# Patient Record
Sex: Male | Born: 1935 | Race: White | Hispanic: No | Smoking: Former smoker
Health system: Southern US, Community
[De-identification: ages and names within clinical notes are randomized; demographics above are authoritative.]

## PROBLEM LIST (undated history)

## (undated) DIAGNOSIS — Z950 Presence of cardiac pacemaker: Secondary | ICD-10-CM

## (undated) DIAGNOSIS — Z8619 Personal history of other infectious and parasitic diseases: Secondary | ICD-10-CM

## (undated) DIAGNOSIS — I4891 Unspecified atrial fibrillation: Secondary | ICD-10-CM

## (undated) DIAGNOSIS — M1990 Unspecified osteoarthritis, unspecified site: Secondary | ICD-10-CM

## (undated) DIAGNOSIS — N529 Male erectile dysfunction, unspecified: Secondary | ICD-10-CM

## (undated) DIAGNOSIS — C439 Malignant melanoma of skin, unspecified: Secondary | ICD-10-CM

## (undated) DIAGNOSIS — H409 Unspecified glaucoma: Secondary | ICD-10-CM

## (undated) DIAGNOSIS — I499 Cardiac arrhythmia, unspecified: Secondary | ICD-10-CM

## (undated) DIAGNOSIS — I10 Essential (primary) hypertension: Secondary | ICD-10-CM

## (undated) HISTORY — DX: Unspecified osteoarthritis, unspecified site: M19.90

## (undated) HISTORY — DX: Essential (primary) hypertension: I10

## (undated) HISTORY — PX: HERNIA REPAIR: SHX51

## (undated) HISTORY — PX: NASAL SINUS SURGERY: SHX719

## (undated) HISTORY — DX: Male erectile dysfunction, unspecified: N52.9

## (undated) HISTORY — DX: Unspecified atrial fibrillation: I48.91

## (undated) HISTORY — DX: Personal history of other infectious and parasitic diseases: Z86.19

## (undated) HISTORY — DX: Malignant melanoma of skin, unspecified: C43.9

## (undated) HISTORY — PX: EYE SURGERY: SHX253

## (undated) HISTORY — DX: Unspecified glaucoma: H40.9

## (undated) HISTORY — DX: Cardiac arrhythmia, unspecified: I49.9

---

## 1970-06-06 DIAGNOSIS — C801 Malignant (primary) neoplasm, unspecified: Secondary | ICD-10-CM

## 1970-06-06 HISTORY — DX: Malignant (primary) neoplasm, unspecified: C80.1

## 2005-06-06 HISTORY — PX: NASAL SINUS SURGERY: SHX719

## 2005-07-07 ENCOUNTER — Ambulatory Visit: Payer: Self-pay | Admitting: Otolaryngology

## 2005-07-07 ENCOUNTER — Other Ambulatory Visit: Payer: Self-pay

## 2005-07-12 ENCOUNTER — Ambulatory Visit: Payer: Self-pay | Admitting: Otolaryngology

## 2013-12-12 ENCOUNTER — Other Ambulatory Visit: Payer: Self-pay | Admitting: Otolaryngology

## 2013-12-12 DIAGNOSIS — H7290 Unspecified perforation of tympanic membrane, unspecified ear: Secondary | ICD-10-CM

## 2013-12-12 DIAGNOSIS — H6690 Otitis media, unspecified, unspecified ear: Secondary | ICD-10-CM

## 2013-12-16 ENCOUNTER — Other Ambulatory Visit: Payer: Self-pay | Admitting: Otolaryngology

## 2013-12-16 LAB — CREATININE, SERUM: Creat: 0.9 mg/dL (ref 0.50–1.35)

## 2013-12-16 LAB — BUN: BUN: 12 mg/dL (ref 6–23)

## 2013-12-17 ENCOUNTER — Other Ambulatory Visit: Payer: Self-pay | Admitting: Otolaryngology

## 2013-12-17 ENCOUNTER — Ambulatory Visit
Admission: RE | Admit: 2013-12-17 | Discharge: 2013-12-17 | Disposition: A | Discharge status: Home / Self Care | Payer: Medicare Other | Source: Ambulatory Visit | Attending: Otolaryngology | Admitting: Otolaryngology

## 2013-12-17 DIAGNOSIS — J0111 Acute recurrent frontal sinusitis: Secondary | ICD-10-CM

## 2013-12-17 DIAGNOSIS — J310 Chronic rhinitis: Secondary | ICD-10-CM

## 2013-12-17 MED ORDER — IOHEXOL 300 MG/ML  SOLN
75.0000 mL | Freq: Once | INTRAMUSCULAR | Status: AC | PRN
  Administered 2013-12-17: 75 mL via INTRAVENOUS

## 2013-12-18 ENCOUNTER — Inpatient Hospital Stay: Admission: RE | Admit: 2013-12-18 | Payer: Self-pay | Source: Ambulatory Visit

## 2014-06-06 HISTORY — PX: PACEMAKER INSERTION: SHX728

## 2015-03-05 ENCOUNTER — Encounter: Payer: Self-pay | Admitting: *Deleted

## 2015-03-05 ENCOUNTER — Ambulatory Visit (INDEPENDENT_AMBULATORY_CARE_PROVIDER_SITE_OTHER): Payer: Medicare Other | Admitting: Cardiology

## 2015-03-05 VITALS — BP 128/72 | HR 52 | Ht 69.0 in | Wt 174.0 lb

## 2015-03-05 DIAGNOSIS — I4430 Unspecified atrioventricular block: Secondary | ICD-10-CM | POA: Diagnosis not present

## 2015-03-05 DIAGNOSIS — Z01812 Encounter for preprocedural laboratory examination: Secondary | ICD-10-CM

## 2015-03-05 LAB — CBC WITH DIFFERENTIAL/PLATELET
BASOS ABS: 0 10*3/uL (ref 0.0–0.1)
BASOS PCT: 0.5 % (ref 0.0–3.0)
EOS PCT: 2.2 % (ref 0.0–5.0)
Eosinophils Absolute: 0.1 10*3/uL (ref 0.0–0.7)
HEMATOCRIT: 42.9 % (ref 39.0–52.0)
Hemoglobin: 14.1 g/dL (ref 13.0–17.0)
LYMPHS ABS: 1.8 10*3/uL (ref 0.7–4.0)
LYMPHS PCT: 26.2 % (ref 12.0–46.0)
MCHC: 32.7 g/dL (ref 30.0–36.0)
MCV: 96.3 fl (ref 78.0–100.0)
MONOS PCT: 9.2 % (ref 3.0–12.0)
Monocytes Absolute: 0.6 10*3/uL (ref 0.1–1.0)
NEUTROS ABS: 4.2 10*3/uL (ref 1.4–7.7)
NEUTROS PCT: 61.9 % (ref 43.0–77.0)
PLATELETS: 172 10*3/uL (ref 150.0–400.0)
RBC: 4.45 Mil/uL (ref 4.22–5.81)
RDW: 14.9 % (ref 11.5–15.5)
WBC: 6.7 10*3/uL (ref 4.0–10.5)

## 2015-03-05 LAB — BASIC METABOLIC PANEL
BUN: 19 mg/dL (ref 6–23)
CHLORIDE: 106 meq/L (ref 96–112)
CO2: 32 meq/L (ref 19–32)
Calcium: 9.1 mg/dL (ref 8.4–10.5)
Creatinine, Ser: 1.2 mg/dL (ref 0.40–1.50)
GFR: 61.31 mL/min (ref >60.00)
GLUCOSE: 100 mg/dL — AB (ref 70–99)
POTASSIUM: 4.6 meq/L (ref 3.5–5.1)
SODIUM: 143 meq/L (ref 135–145)

## 2015-03-05 NOTE — Progress Notes (Signed)
Electrophysiology Office Note   Date:  03/05/2015   ID:  [name], DOB 1929-04-28, MRN [id number]  PCP:  Irven Shelling, MD  Primary Electrophysiologist: Constance Haw, MD    Chief Complaint  Patient presents with  . Bradycardia  . Fatigue     History of Present Illness: [name] is a 81 y.o. male who presents today for electrophysiology evaluation.   He presents today with fatigue.  He states that he gets tired when walking to the mailbox.  This has gotten worse over the last month.  He has associated SOB with exertion but no CP.     Today, he denies symptoms of palpitations, chest pain, orthopnea, PND, lower extremity edema, claudication, dizziness, presyncope, syncope, bleeding, or neurologic sequela. The patient is tolerating medications without difficulties and is otherwise without complaint today.    Past Medical History  Diagnosis Date  . Hypertension   . ED (erectile dysfunction)   . Melanoma     left abdomen   Past Surgical History  Procedure Laterality Date  . Nasal sinus surgery       Current Outpatient Prescriptions  Medication Sig Dispense Refill  . amLODipine (NORVASC) 10 MG tablet Take 10 mg by mouth daily.  3  . aspirin 81 MG tablet Take 81 mg by mouth daily.    . cholecalciferol (VITAMIN D) 1000 UNITS tablet Take 1,000 Units by mouth daily.    Marland Kitchen lisinopril (PRINIVIL,ZESTRIL) 20 MG tablet Take 20 mg by mouth daily.  3   No current facility-administered medications for this visit.    Allergies:   Sulfa antibiotics   Social History:  The patient  reports that he has quit smoking. He does not have any smokeless tobacco history on file. He reports that he drinks alcohol. He reports that he does not use illicit drugs.   Family History:  The patient's family history includes Heart failure in his mother; Hypertension in his father and mother.    ROS:  Please see the history of present illness.   Otherwise, review of systems is  positive for shortness of breath and fatigue with exertion.   All other systems are reviewed and negative.    PHYSICAL EXAM: VS:  BP 128/72 mmHg  Pulse 52  Ht 5\' 9"  (1.753 m)  Wt 174 lb (78.926 kg)  BMI 25.68 kg/m2 , BMI Body mass index is 25.68 kg/(m^2). GEN: Well nourished, well developed, in no acute distress HEENT: normal Neck: no JVD, carotid bruits, or masses Cardiac: RRR; no murmurs, rubs, or gallops,no edema  Respiratory:  clear to auscultation bilaterally, normal work of breathing GI: soft, nontender, nondistended, + BS MS: no deformity or atrophy Skin: warm and dry Neuro:  Strength and sensation are intact Psych: euthymic mood, full affect  EKG:  EKG is not ordered today. The ekg ordered yesterday shows 2:1 AV block  Recent Labs: No results found for requested labs within last 365 days.    Lipid Panel  No results found for: CHOL, TRIG, HDL, CHOLHDL, VLDL, LDLCALC, LDLDIRECT   Wt Readings from Last 3 Encounters:  03/05/15 174 lb (78.926 kg)    ASSESSMENT AND PLAN:  1. 2:1 AV block: likely the cause of his fatigue.  Discussed options including pacemaker placement.  He is agreeable to this and we will plan for early next week.  Told him that if he becomes dizzy to call and be seen.  Discussed the procedure risks and benefits including damage to lungs, heart, bleeding and  infection.    Current medicines are reviewed at length with the patient today.   The patient does not have concerns regarding his medicines.  The following changes were made today:  none  Labs/ tests ordered today include: BMP, CBC, dual chamber pacemaker placement  Orders Placed This Encounter  Procedures  . Basic Metabolic Panel (BMET)  . CBC w/Diff    Disposition:   FU with Will Camnitz post pacemaker placement  Signed, Will Meredith Leeds, MD  03/05/2015 2:54 PM     [address] [place] [address] [place] [id number] [phone] (office) [phone]  (fax)

## 2015-03-05 NOTE — Patient Instructions (Addendum)
Medication Instructions:  Your physician recommends that you continue on your current medications as directed. Please refer to the Current Medication list given to you today.  Labwork: Pre procedure labs today: BMET & CBCD  Testing/Procedures: Your physician has recommended that you have a pacemaker inserted. A pacemaker is a small device that is placed under the skin of your chest or abdomen to help control abnormal heart rhythms. This device uses electrical pulses to prompt the heart to beat at a normal rate. Pacemakers are used to treat heart rhythms that are too slow. Wire (leads) are attached to the pacemaker that goes into the chambers of you heart. This is done in the hospital and usually requires and overnight stay. Please see the instruction sheet given to you today for more information.  Follow-Up: Your wound check is scheduled for 03/23/15 at 12:00 pm at [address]    Any Other Special Instructions Will Be Listed Below (If Applicable). Thank you for choosing [facility]!!   Trinidad Curet, RN [phone]    Pacemaker Implantation The heart has its own electrical system, or natural pacemaker, to regulate the heartbeat. Sometimes, the natural pacemaker system of the heart fails and causes the heart to beat too slowly. If this happens, a pacemaker can be surgically placed to help the heart beat at a normal or programmed rate. A pacemaker is a small, battery-powered device that is placed under the skin and is programmed to sense your heartbeats. If your heart rate is lower than the programmed rate, the pacemaker will pace your heart. Parts of a pacemaker include:  Wires or leads. The leads are placed in the heart and transmit electricity to the heart. The leads are connected to the pulse generator.  Pulse generator. The pulse generator contains a computer and a memory system. The pulse generator also produces the electrical signal that triggers the heart to  beat. A pacemaker may be placed if:  You have a slow heartbeat (bradycardia).  You have fainting (syncope).  Shortness of breath (dyspnea) due to heart problems. LET [facility] CARE PROVIDER KNOW ABOUT:  Any allergies you may have.  All medicines you are taking, including vitamins, herbs, eye drops, creams, and over-the-counter medicines.  Previous problems you or members of your family have had with the use of anesthetics.  Any blood disorders you have.  Previous surgeries you have had.  Medical conditions you have.  Possibility of pregnancy, if this applies. RISKS AND COMPLICATIONS Generally, pacemaker implantation is a safe procedure. However, problems can occur and include:  Bleeding.  Unable to place the pacemaker under local sedation.  Infection. BEFORE THE PROCEDURE  You will have blood work drawn before the procedure.  Do not use any tobacco products including cigarettes, chewing tobacco, or electronic cigarettes. If you need help quitting, ask your health care provider.  Do not eat or drink anything after midnight on the night before the procedure or as directed by your health care provider.  Ask your health care provider about:  Changing or stopping your regular medicines. This is especially important if you are taking diabetes medicines or blood thinners.  Taking medicines such as aspirin and ibuprofen. These medicines can thin your blood. Do not take these medicines before your procedure if your health care provider asks you not to.  Ask your health care provider if you can take a sip of water with any approved medicines the morning of the procedure. PROCEDURE  The surgery to place a  pacemaker is considered a minimally invasive surgical procedure. It is done under a local anesthetic, which is an injection at the incision site that makes the skin numb. You are also given sedation and pain medicine that makes you drowsy during the procedure.   An  intravenous line (IV) will be started in your hand or arm so sedation and pain medicine can be given during the pacemaker procedure.  A numbing medicine will be injected into the skin where the pacemaker is to be placed. A small incision will then be made into the skin. The pacemaker is usually placed under the skin near the collarbone.  After the incision has been made, the leads will be inserted into a large vein and guided into the heart using X-ray.  Using the same incision that was used to place the leads, a small pocket will be created under the skin to hold the pulse generator. The leads will then be connected to the pulse generator.  The incision site will then be closed. A bandage (dressing) is placed over the pacemaker site. The dressing is removed 24-48 hours afterward. AFTER THE PROCEDURE  You will be taken to a recovery area after the pacemaker implant. Your vital signs such as blood pressure, heart rate, breathing, and oxygen levels will be monitored.  A chest X-ray will be done after the pacemaker has been implanted. This is to make sure the pacemaker and leads are in the correct place. Document Released: 05/13/2002 Document Revised: 10/07/2013 Document Reviewed: 09/27/2011 [facility] Patient Information 2015 [place], [place]. This information is not intended to replace advice given to you by your health care provider. Make sure you discuss any questions you have with your health care provider.  Pacemaker Implantation, Care After Refer to this sheet over the next few weeks. These instructions provide you with information on caring for yourself after the procedure. Your health care provider may also give you more specific instructions. Your treatment has been planned according to current medical practices, but problems sometimes occur. Call your health care provider if you have any problems or questions regarding your pacemaker.  WHAT TO EXPECT AFTER THE PROCEDURE  You may feel pain.  Some pain is normal. It may last a few days.  A slight bump may be seen over the skin where the device was placed. Sometimes, it is possible to feel the device under the skin. This is normal.  In the months and years afterward, your health care provider will check the device, the leads, and the battery every few months. Eventually, when the battery is low, the device will be replaced. HOME CARE INSTRUCTIONS Medicines  Take medicines only as directed by your health care provider.  If you were prescribed an antibiotic medicine, finish it all even if you start to feel better.  Do not take any other medicines without asking your health care provider first. Some medicines, including certain painkillers, can cause bleeding in your stomach after surgery. Wound Care  Do not remove the bandage on your chest until directed to do so by your health care provider.  After your bandage is removed, you may see pieces of tape called skin adhesive strips over the area where the cut was made (incision site). Let them fall off on their own.  Check the incision site every day to make sure it is not infected, bleeding, or starting to pull apart.  Do not use lotions or ointments near the incision site unless directed to do so.  Keep the  incision area clean and dry for 2-3 days after the procedure or as directed by your health care provider. It takes several weeks for the incision site to completely heal.  Do not take baths, swim, or use a hot tub until your health care provider approves. Activities  Try to walk a little every day. Exercising is important after this procedure. It is also important to use your shoulder on the side of the pacemaker in daily tasks that do not require exaggerated motion.  Avoid sudden jerking, pulling, or chopping movements that pull your upper arm far away from your body for at least 6 weeks.  Do not lift your upper arm above your shoulders for at least 6 weeks. This means no  tennis, golf, or swimming for this period of time. If you sleep with the arm above your head, use a restraint to prevent this from happening as you sleep.  You may go back to work when your health care provider says it is okay. Check with your health care provider before you start to drive or play sports. Other Instructions  Follow diet instructions if they were provided. You should be able to eat what you usually do right away, but you may need to limit your salt intake.  Weigh yourself every day. If you suddenly gain weight, fluid may be building up in your body.  Always carry your pacemaker identification card with you. The card should list the implant date, device model, and manufacturer. Consider wearing a medical alert bracelet or necklace.  Tell all health care providers that you have a pacemaker. This may prevent them from giving you a magnetic resource imaging scan (MRI) because of the strong magnets used during that test.  If you must pass through a metal detector, quickly walk through it. Do not stop under the detector or stand near it.  Avoid places or objects with a strong electric or magnetic field, including:  Engineer, maintenance. When at the airport, let officials know you have a pacemaker. Your ID card will let you be checked in a way that is safe for you and that will not damage your pacemaker. Also, do not let a security person wave a magnetic wand near your pacemaker. That can make it stop working.  Power plants.  Large electrical generators.  Radiofrequency transmission towers, such as cell phone and radio towers.  Do not use amateur (ham) radio equipment or electric (arc) welding torches. Some devices are safe to use if held at least 1 foot from your pacemaker. These include power tools, lawn mowers, and speakers. If you are unsure of whether something is safe to use, ask your health care provider.  You may safely use electric blankets, heating pads, computers, and  microwave ovens.  When using your cell phone, hold it to the ear opposite the pacemaker. Do not leave your cell phone in a pocket over the pacemaker.  Keep all follow-up visits as directed by your health care provider. This is how your health care provider makes sure your chest is healing the way it should. Ask your health care provider when you should come back to have your stitches or staples taken out.  Have your pacemaker checked every 3-6 months or as directed by your health care provider. Most pacemakers last for 4-8 years before a new one is needed. SEEK MEDICAL CARE IF:  You gain weight suddenly.  Your legs or feet swell more than they have before.  It feels like your  heart is fluttering or skipping beats (heart palpitations).  You have a fever. SEEK IMMEDIATE MEDICAL CARE IF:  You have chest pain.  You feel more short of breath than you have felt before.  You feel more light-headed than you have felt before.  You have problems with your incision site, such as swelling or bleeding, or it starts to open up.  You have drainage, redness, swelling, or pain at your incision site. Document Released: 12/10/2004 Document Revised: 10/07/2013 Document Reviewed: 09/23/2011 [facility] Patient Information 2015 [place], [place]. This information is not intended to replace advice given to you by your health care provider. Make sure you discuss any questions you have with your health care provider.

## 2015-03-09 ENCOUNTER — Encounter (HOSPITAL_COMMUNITY): Payer: Self-pay

## 2015-03-09 ENCOUNTER — Ambulatory Visit (HOSPITAL_COMMUNITY)
Admission: RE | Admit: 2015-03-09 | Discharge: 2015-03-10 | Disposition: A | Discharge status: Home / Self Care | Payer: Medicare Other | Source: Ambulatory Visit | Attending: Cardiology | Admitting: Cardiology

## 2015-03-09 ENCOUNTER — Encounter (HOSPITAL_COMMUNITY): Payer: Self-pay | Admitting: Anesthesiology

## 2015-03-09 ENCOUNTER — Encounter (HOSPITAL_COMMUNITY)
Admission: RE | Disposition: A | Discharge status: Home / Self Care | Payer: Medicare Other | Source: Ambulatory Visit | Attending: Cardiology

## 2015-03-09 DIAGNOSIS — Z95818 Presence of other cardiac implants and grafts: Secondary | ICD-10-CM

## 2015-03-09 DIAGNOSIS — I441 Atrioventricular block, second degree: Secondary | ICD-10-CM | POA: Insufficient documentation

## 2015-03-09 DIAGNOSIS — I459 Conduction disorder, unspecified: Secondary | ICD-10-CM | POA: Diagnosis present

## 2015-03-09 DIAGNOSIS — I10 Essential (primary) hypertension: Secondary | ICD-10-CM | POA: Insufficient documentation

## 2015-03-09 DIAGNOSIS — I4430 Unspecified atrioventricular block: Secondary | ICD-10-CM

## 2015-03-09 DIAGNOSIS — Z01812 Encounter for preprocedural laboratory examination: Secondary | ICD-10-CM

## 2015-03-09 HISTORY — PX: EP IMPLANTABLE DEVICE: SHX172B

## 2015-03-09 LAB — SURGICAL PCR SCREEN
MRSA, PCR: POSITIVE — AB
Staphylococcus aureus: POSITIVE — AB

## 2015-03-09 SURGERY — PACEMAKER IMPLANT
Anesthesia: General

## 2015-03-09 MED ORDER — CEFAZOLIN SODIUM 1-5 GM-% IV SOLN
1.0000 g | Freq: Four times a day (QID) | INTRAVENOUS | Status: AC
  Administered 2015-03-09 – 2015-03-10 (×3): 1 g via INTRAVENOUS
  Filled 2015-03-09 (×3): qty 50

## 2015-03-09 MED ORDER — INFLUENZA VAC SPLIT QUAD 0.5 ML IM SUSY
0.5000 mL | PREFILLED_SYRINGE | INTRAMUSCULAR | Status: DC
  Filled 2015-03-09: qty 0.5

## 2015-03-09 MED ORDER — MUPIROCIN 2 % EX OINT
1.0000 "application " | TOPICAL_OINTMENT | Freq: Once | CUTANEOUS | Status: AC
  Administered 2015-03-09: 1 via TOPICAL
  Filled 2015-03-09: qty 22

## 2015-03-09 MED ORDER — SODIUM CHLORIDE 0.9 % IR SOLN
Status: AC
  Filled 2015-03-09: qty 2

## 2015-03-09 MED ORDER — ONDANSETRON HCL 4 MG/2ML IJ SOLN: 4.0000 mg | Freq: Four times a day (QID) | INTRAMUSCULAR | Status: DC | PRN

## 2015-03-09 MED ORDER — SODIUM CHLORIDE 0.9 % IV SOLN
Freq: Once | INTRAVENOUS | Status: AC
  Administered 2015-03-09: 10:00:00 via INTRAVENOUS

## 2015-03-09 MED ORDER — LIDOCAINE-EPINEPHRINE 1 %-1:100000 IJ SOLN
INTRAMUSCULAR | Status: AC
  Filled 2015-03-09: qty 2

## 2015-03-09 MED ORDER — FENTANYL CITRATE (PF) 100 MCG/2ML IJ SOLN
INTRAMUSCULAR | Status: AC
  Filled 2015-03-09: qty 4

## 2015-03-09 MED ORDER — FENTANYL CITRATE (PF) 100 MCG/2ML IJ SOLN
INTRAMUSCULAR | Status: DC | PRN
  Administered 2015-03-09: 25 ug via INTRAVENOUS
  Administered 2015-03-09: 12.5 ug via INTRAVENOUS

## 2015-03-09 MED ORDER — SODIUM CHLORIDE 0.9 % IR SOLN
80.0000 mg | Status: DC
  Filled 2015-03-09: qty 2

## 2015-03-09 MED ORDER — ACETAMINOPHEN 325 MG PO TABS
325.0000 mg | ORAL_TABLET | ORAL | Status: DC | PRN
  Administered 2015-03-10: 650 mg via ORAL
  Filled 2015-03-09: qty 2

## 2015-03-09 MED ORDER — SODIUM CHLORIDE 0.9 % IV SOLN
INTRAVENOUS | Status: DC
  Administered 2015-03-09: 10:00:00 via INTRAVENOUS

## 2015-03-09 MED ORDER — CEFAZOLIN SODIUM-DEXTROSE 2-3 GM-% IV SOLR
2.0000 g | INTRAVENOUS | Status: AC
  Administered 2015-03-09: 2 g via INTRAVENOUS

## 2015-03-09 MED ORDER — MIDAZOLAM HCL 5 MG/5ML IJ SOLN
INTRAMUSCULAR | Status: DC | PRN
  Administered 2015-03-09: 1 mg via INTRAVENOUS
  Administered 2015-03-09: 2 mg via INTRAVENOUS

## 2015-03-09 MED ORDER — MIDAZOLAM HCL 5 MG/5ML IJ SOLN
INTRAMUSCULAR | Status: AC
  Filled 2015-03-09: qty 25

## 2015-03-09 MED ORDER — CEFAZOLIN SODIUM-DEXTROSE 2-3 GM-% IV SOLR
INTRAVENOUS | Status: AC
  Filled 2015-03-09: qty 50

## 2015-03-09 MED ORDER — CHLORHEXIDINE GLUCONATE CLOTH 2 % EX PADS
6.0000 | MEDICATED_PAD | Freq: Every day | CUTANEOUS | Status: DC
  Administered 2015-03-10: 6 via TOPICAL

## 2015-03-09 MED ORDER — MUPIROCIN 2 % EX OINT
TOPICAL_OINTMENT | CUTANEOUS | Status: AC
  Administered 2015-03-09: 1 via TOPICAL
  Filled 2015-03-09: qty 22

## 2015-03-09 MED ORDER — LIDOCAINE-EPINEPHRINE 1 %-1:100000 IJ SOLN
INTRAMUSCULAR | Status: DC | PRN
  Administered 2015-03-09: 40 mL via INTRADERMAL

## 2015-03-09 MED ORDER — MUPIROCIN 2 % EX OINT
1.0000 "application " | TOPICAL_OINTMENT | Freq: Two times a day (BID) | CUTANEOUS | Status: DC
  Administered 2015-03-09: 1 via NASAL

## 2015-03-09 SURGICAL SUPPLY — 7 items
CABLE SURGICAL S-101-97-12 (CABLE) ×2 IMPLANT
LEAD TENDRIL SDX 2088TC-52CM (Lead) ×2 IMPLANT
LEAD TENDRIL SDX 2088TC-58CM (Lead) ×2 IMPLANT
PAD DEFIB LIFELINK (PAD) ×2 IMPLANT
PPM ASSURITY DR PM2240 (Pacemaker) ×2 IMPLANT
SHEATH CLASSIC 7F (SHEATH) ×4 IMPLANT
TRAY PACEMAKER INSERTION (CUSTOM PROCEDURE TRAY) ×2 IMPLANT

## 2015-03-09 NOTE — Progress Notes (Signed)
Orthopedic Tech Progress Note Patient Details:  [name] 1928-08-08 [id number]  Ortho Devices Type of Ortho Device: Arm sling Ortho Device/Splint Interventions: Application   [name] 03/09/2015, 1:40 PM

## 2015-03-09 NOTE — H&P (Signed)
Arda Keadle Meredith Leeds, MD Physician Signed Electrophysiology Consult Note 03/09/2015 11:06 AM    Expand All Collapse All   History of Present Illness: [name] is a 81 y.o. male who presents today for electrophysiology evaluation. He presents today with fatigue. He states that he gets tired when walking to the mailbox. This has gotten worse over the last month. He has associated SOB with exertion but no CP.    Today, he denies symptoms of palpitations, chest pain, orthopnea, PND, lower extremity edema, claudication, dizziness, presyncope, syncope, bleeding, or neurologic sequela. The patient is tolerating medications without difficulties and is otherwise without complaint today.    Past Medical History  Diagnosis Date  . Hypertension   . ED (erectile dysfunction)   . Melanoma     left abdomen   Past Surgical History  Procedure Laterality Date  . Nasal sinus surgery       Current Outpatient Prescriptions  Medication Sig Dispense Refill  . amLODipine (NORVASC) 10 MG tablet Take 10 mg by mouth daily.  3  . aspirin 81 MG tablet Take 81 mg by mouth daily.    . cholecalciferol (VITAMIN D) 1000 UNITS tablet Take 1,000 Units by mouth daily.    Marland Kitchen lisinopril (PRINIVIL,ZESTRIL) 20 MG tablet Take 20 mg by mouth daily.  3   No current facility-administered medications for this visit.    Allergies: Sulfa antibiotics   Social History: The patient reports that he has quit smoking. He does not have any smokeless tobacco history on file. He reports that he drinks alcohol. He reports that he does not use illicit drugs.   Family History: The patient's family history includes Heart failure in his mother; Hypertension in his father and mother.    ROS: Please see the history of present illness. Otherwise, review of systems is positive for shortness of breath and fatigue with  exertion. All other systems are reviewed and negative.    PHYSICAL EXAM: VS: BP 128/72 mmHg  Pulse 52  Ht 5\' 9"  (1.753 m)  Wt 174 lb (78.926 kg)  BMI 25.68 kg/m2 , BMI Body mass index is 25.68 kg/(m^2). GEN: Well nourished, well developed, in no acute distress  HEENT: normal  Neck: no JVD, carotid bruits, or masses Cardiac: RRR; no murmurs, rubs, or gallops,no edema  Respiratory: clear to auscultation bilaterally, normal work of breathing GI: soft, nontender, nondistended, + BS MS: no deformity or atrophy  Skin: warm and dry Neuro: Strength and sensation are intact Psych: euthymic mood, full affect  EKG: EKG is not ordered today. The ekg ordered yesterday shows 2:1 AV block  Recent Labs: No results found for requested labs within last 365 days.    Lipid Panel   Labs (Brief)    No results found for: CHOL, TRIG, HDL, CHOLHDL, VLDL, LDLCALC, LDLDIRECT     Wt Readings from Last 3 Encounters:  03/05/15 174 lb (78.926 kg)    ASSESSMENT AND PLAN:  1. 2:1 AV block: likely the cause of his fatigue. Discussed options including pacemaker placement. He is agreeable to this and we Jamai Dolce plan for early next week. Told him that if he becomes dizzy to call and be seen. Discussed the procedure risks and benefits including damage to lungs, heart, bleeding and infection.   Current medicines are reviewed at length with the patient today.  The patient does not have concerns regarding his medicines. The following changes were made today: none  Labs/ tests ordered today include: BMP, CBC, dual chamber pacemaker placement  Orders Placed This Encounter  Procedures  . Basic Metabolic Panel (BMET)  . CBC w/Diff    Disposition: FU with Antonius Hartlage post pacemaker placement  Signed, Daisa Stennis Meredith Leeds, MD  03/05/2015 2:54 PM        Patient presented with 2:1 heart block. Presents today feeling well but with increased fatigue. Plan  for dual chamber pacemaker placement. Risks and benefits discussed with the patient including damage to heart or lungs, bleeding and infection. Patient agrees and Tong Pieczynski proceed with dual chamber pacemaker placement.  Destin Kittler 03/09/2015 11:07 AM       Routing History     Date/Time From To Method   03/09/2015 11:07 AM Rocket Gunderson Meredith Leeds, MD Lavone Orn, MD Fax

## 2015-03-09 NOTE — Consult Note (Deleted)
History of Present Illness: [name] is a 81 y.o. male who presents today for electrophysiology evaluation. He presents today with fatigue. He states that he gets tired when walking to the mailbox. This has gotten worse over the last month. He has associated SOB with exertion but no CP.    Today, he denies symptoms of palpitations, chest pain, orthopnea, PND, lower extremity edema, claudication, dizziness, presyncope, syncope, bleeding, or neurologic sequela. The patient is tolerating medications without difficulties and is otherwise without complaint today.    Past Medical History  Diagnosis Date  . Hypertension   . ED (erectile dysfunction)   . Melanoma     left abdomen   Past Surgical History  Procedure Laterality Date  . Nasal sinus surgery       Current Outpatient Prescriptions  Medication Sig Dispense Refill  . amLODipine (NORVASC) 10 MG tablet Take 10 mg by mouth daily.  3  . aspirin 81 MG tablet Take 81 mg by mouth daily.    . cholecalciferol (VITAMIN D) 1000 UNITS tablet Take 1,000 Units by mouth daily.    Marland Kitchen lisinopril (PRINIVIL,ZESTRIL) 20 MG tablet Take 20 mg by mouth daily.  3   No current facility-administered medications for this visit.    Allergies: Sulfa antibiotics   Social History: The patient  reports that he has quit smoking. He does not have any smokeless tobacco history on file. He reports that he drinks alcohol. He reports that he does not use illicit drugs.   Family History: The patient's family history includes Heart failure in his mother; Hypertension in his father and mother.    ROS: Please see the history of present illness. Otherwise, review of systems is positive for shortness of breath and fatigue with exertion. All other systems are reviewed and negative.    PHYSICAL EXAM: VS: BP 128/72 mmHg  Pulse 52  Ht 5\' 9"  (1.753 m)  Wt 174 lb (78.926 kg)  BMI 25.68 kg/m2 , BMI  Body mass index is 25.68 kg/(m^2). GEN: Well nourished, well developed, in no acute distress  HEENT: normal  Neck: no JVD, carotid bruits, or masses Cardiac: RRR; no murmurs, rubs, or gallops,no edema  Respiratory: clear to auscultation bilaterally, normal work of breathing GI: soft, nontender, nondistended, + BS MS: no deformity or atrophy  Skin: warm and dry Neuro: Strength and sensation are intact Psych: euthymic mood, full affect  EKG: EKG is not ordered today. The ekg ordered yesterday shows 2:1 AV block  Recent Labs: No results found for requested labs within last 365 days.    Lipid Panel   Labs (Brief)    No results found for: CHOL, TRIG, HDL, CHOLHDL, VLDL, LDLCALC, LDLDIRECT     Wt Readings from Last 3 Encounters:  03/05/15 174 lb (78.926 kg)    ASSESSMENT AND PLAN:  1. 2:1 AV block: likely the cause of his fatigue. Discussed options including pacemaker placement. He is agreeable to this and we [name] plan for early next week. Told him that if he becomes dizzy to call and be seen. Discussed the procedure risks and benefits including damage to lungs, heart, bleeding and infection.   Current medicines are reviewed at length with the patient today.  The patient does not have concerns regarding his medicines. The following changes were made today: none  Labs/ tests ordered today include: BMP, CBC, dual chamber pacemaker placement  Orders Placed This Encounter  Procedures  . Basic Metabolic Panel (BMET)  . CBC w/Diff    Disposition:  FU with [name] post pacemaker placement  Signed, Geoffrey Bonfanti Meredith Leeds, MD  03/05/2015 2:54 PM        Patient presented with 2:1 heart block.  Presents today feeling well but with increased fatigue.  Plan for dual chamber pacemaker placement.  Risks and benefits discussed with the patient including damage to heart or lungs, bleeding and infection.  Patient agrees and [name] proceed with dual chamber  pacemaker placement.  [name] 03/09/2015 11:07 AM

## 2015-03-10 ENCOUNTER — Ambulatory Visit (HOSPITAL_COMMUNITY): Payer: Medicare Other

## 2015-03-10 DIAGNOSIS — I459 Conduction disorder, unspecified: Secondary | ICD-10-CM

## 2015-03-10 DIAGNOSIS — I10 Essential (primary) hypertension: Secondary | ICD-10-CM | POA: Diagnosis not present

## 2015-03-10 DIAGNOSIS — I441 Atrioventricular block, second degree: Secondary | ICD-10-CM | POA: Diagnosis not present

## 2015-03-10 MED FILL — Sodium Chloride Irrigation Soln 0.9%: Qty: 500 | Status: AC

## 2015-03-10 MED FILL — Gentamicin Sulfate Inj 40 MG/ML: INTRAMUSCULAR | Qty: 2 | Status: AC

## 2015-03-10 NOTE — Discharge Instructions (Signed)
° ° °  Supplemental Discharge Instructions for  Pacemaker/Defibrillator Patients  Activity No heavy lifting or vigorous activity with your left/right arm for 6 to 8 weeks.  Do not raise your left/right arm above your head for one week.  Gradually raise your affected arm as drawn below.                       10/07                     10/08                    10/09                    10/10            NO DRIVING for  1 week    ; you may begin driving on     68/11     . WOUND CARE - Keep the wound area clean and dry.  Do not get this area wet for one week. No showers for one week; you may shower on      10/10        . - The tape/steri-strips on your wound will fall off; do not pull them off.  No bandage is needed on the site.  DO  NOT apply any creams, oils, or ointments to the wound area. - If you notice any drainage or discharge from the wound, any swelling or bruising at the site, or you develop a fever > 101? F after you are discharged home, call the office at once.  Special Instructions - You are still able to use cellular telephones; use the ear opposite the side where you have your pacemaker/defibrillator.  Avoid carrying your cellular phone near your device. - When traveling through airports, show security personnel your identification card to avoid being screened in the metal detectors.  Ask the security personnel to use the hand wand. - Avoid arc welding equipment, MRI testing (magnetic resonance imaging), TENS units (transcutaneous nerve stimulators).  Call the office for questions about other devices. - Avoid electrical appliances that are in poor condition or are not properly grounded. - Microwave ovens are safe to be near or to operate.  DO NOT DRIVE YOURSELF OR A FAMILY MEMBER WITH A PACEMAKER TO THE HOSPITAL--CALL 911.

## 2015-03-10 NOTE — Discharge Summary (Signed)
ELECTROPHYSIOLOGY PROCEDURE DISCHARGE SUMMARY    Patient ID: [name],  MRN: [id number], DOB/AGE: June 29, 1928 81 y.o.  Admit date: 03/09/2015 Discharge date: 03/10/2015  Primary Care Physician: Irven Shelling, MD Electrophysiologist: Dr. Curt Bears  Primary Discharge Diagnosis:  Symptomatic bradycardia status post pacemaker implantation this admission.  Secondary Discharge Diagnosis:  HTN  Allergies  Allergen Reactions  . Avelox [Moxifloxacin Hcl In Nacl] Hives  . Sulfa Antibiotics Rash     Procedures This Admission:  1.  Implantation of a St. Jude model [id number] (serial number [id number] ) dual chamber PPM on 03/09/15 by Dr Curt Bears, a Lyman 2088 (serial number [id number]) right atrial lead and Shiloh 2088 (serial number [id number])  right ventricular lead. There were no immediate post procedure complications. 2.  CXR on 03/10/15 demonstrated no pneumothorax status post device implantation.   Brief HPI: [name] is a 81 y.o. male was referred to electrophysiology in the outpatient setting for consideration of PPM implantation.  Past medical history includes HTN.  The patient has had symptomatic bradycardia noted to be in 2:1 AV block, without reversible causes identified.  Risks, benefits, and alternatives to PPM implantation were reviewed with the patient who wished to proceed.   Hospital Course:  The patient was admitted for elective implantation of a St. Jude PPM with details as outlined above.  He was monitored on telemetry overnight which demonstrated SR with V pacing.  Left chest was without hematoma with slight ecchymosis.  The device was interrogated and found to be functioning normally.  CXR was obtained and demonstrated no pneumothorax status post device implantation.  Wound care, arm mobility, and restrictions were reviewed with the patient.  The patient was examined by Dr. Curt Bears and considered stable for discharge to home.     Physical Exam: Filed Vitals:   03/09/15 1510 03/09/15 1610 03/09/15 2130 03/10/15 0500  BP: 108/58 131/62 139/63 139/68  Pulse:   72 78  Temp:  98 F (36.7 C) 98.2 F (36.8 C) 98.3 F (36.8 C)  TempSrc:  Oral Oral Oral  Resp: 13 31 23 18   Height:      Weight:    167 lb 6.4 oz (75.932 kg)  SpO2:  95% 97% 96%    GEN- The patient is well appearing, alert and oriented x 3 today.   HEENT: normocephalic, atraumatic; sclera clear, conjunctiva pink; hearing intact; oropharynx clear; neck supple Lungs- Clear to ausculation bilaterally, normal work of breathing.  No wheezes, rales, rhonchi Heart- Regular rate and rhythm, no murmurs, rubs or gallops, PMI not laterally displaced GI- soft, non-tender, non-distended, bowel sounds present Extremities- no clubbing, cyanosis, or edema; DP/PT/radial pulses 2+ bilaterally MS- no significant deformity or atrophy Skin- warm and dry, no rash or lesion, left chest without hematoma/ecchymosis Psych- euthymic mood, full affect Neuro- intact grossly   Labs:   Lab Results  Component Value Date   WBC 6.7 03/05/2015   HGB 14.1 03/05/2015   HCT 42.9 03/05/2015   MCV 96.3 03/05/2015   PLT 172.0 03/05/2015     Recent Labs Lab 03/05/15 1512  NA 143  K 4.6  CL 106  CO2 32  BUN 19  CREATININE 1.20  CALCIUM 9.1  GLUCOSE 100*    Discharge Medications:    Medication List    TAKE these medications        amLODipine 10 MG tablet  Commonly known as:  NORVASC  Take 10 mg by mouth daily.  aspirin 81 MG tablet  Take 81 mg by mouth daily.     cholecalciferol 1000 UNITS tablet  Commonly known as:  VITAMIN D  Take 1,000 Units by mouth daily.     lisinopril 20 MG tablet  Commonly known as:  PRINIVIL,ZESTRIL  Take 20 mg by mouth daily.        Disposition: Home Discharge Instructions    Diet - low sodium heart healthy    Complete by:  As directed      Increase activity slowly    Complete by:  As directed            Follow-up Information    Follow up with [facility] On 03/19/2015.   Why:  Wound and device check at 3:00 pm, Please arrive 15 minutes early for paperwork.    Contact information:   [address] [place] [address]       Follow up with Solomon Skowronek Meredith Leeds, MD.   Specialty:  Cardiology   Why:  See in 3 months, the office Topacio Cella call.   Contact information:   [address] [place] [address] [phone]       Duration of Discharge Encounter: Greater than 30 minutes including physician time.  SignedTommye Standard, PA-C 03/10/2015 9:43 AM  I have seen and examined this patient with Tommye Standard.  Agree with above, note added to reflect my findings.  On exam, regular rhythm, no murmurs, lungs clear.  Patient presented 2:1 heart block and is now s/p pacemaker.  CXR without issue.  Plan for follow up in 10-14 days in device clinic for wound check and device programming.  Laneice Meneely M. Dallyn Bergland MD 03/10/2015 2:28 PM

## 2015-03-19 ENCOUNTER — Ambulatory Visit (INDEPENDENT_AMBULATORY_CARE_PROVIDER_SITE_OTHER): Payer: Medicare Other | Admitting: *Deleted

## 2015-03-19 DIAGNOSIS — I4430 Unspecified atrioventricular block: Secondary | ICD-10-CM

## 2015-03-20 LAB — CUP PACEART INCLINIC DEVICE CHECK
Battery Remaining Longevity: 84 mo
Battery Voltage: 3.11 V
Implantable Lead Implant Date: 20161003
Implantable Lead Location: 753859
Implantable Lead Location: 753860
Lead Channel Pacing Threshold Amplitude: 0.5 V
Lead Channel Pacing Threshold Pulse Width: 0.4 ms
Lead Channel Sensing Intrinsic Amplitude: 5 mV
Lead Channel Sensing Intrinsic Amplitude: 7.2 mV
MDC IDC LEAD IMPLANT DT: 20161003
MDC IDC MSMT LEADCHNL RA IMPEDANCE VALUE: 487.5 Ohm
MDC IDC MSMT LEADCHNL RA PACING THRESHOLD PULSEWIDTH: 0.4 ms
MDC IDC MSMT LEADCHNL RV IMPEDANCE VALUE: 550 Ohm
MDC IDC MSMT LEADCHNL RV PACING THRESHOLD AMPLITUDE: 0.5 V
MDC IDC MSMT LEADCHNL RV PACING THRESHOLD AMPLITUDE: 0.5 V
MDC IDC MSMT LEADCHNL RV PACING THRESHOLD PULSEWIDTH: 0.4 ms
MDC IDC PG SERIAL: 7818292
MDC IDC SESS DTM: 20161013190436
MDC IDC SET LEADCHNL RA PACING AMPLITUDE: 1.5 V
MDC IDC SET LEADCHNL RV PACING AMPLITUDE: 3.5 V
MDC IDC SET LEADCHNL RV PACING PULSEWIDTH: 0.4 ms
MDC IDC SET LEADCHNL RV SENSING SENSITIVITY: 2 mV
MDC IDC STAT BRADY RA PERCENT PACED: 43 %
MDC IDC STAT BRADY RV PERCENT PACED: 99.91 %
Pulse Gen Model: 2240

## 2015-03-20 NOTE — Progress Notes (Signed)
Wound check appointment. Steri-strips removed. Wound without redness or edema. Incision edges approximated, wound well healed. Normal device function. Thresholds, sensing, and impedances consistent with implant measurements. Device programmed at 3.5V/auto capture programmed on for extra safety margin until 3 month visit. Histogram distribution appropriate for patient and level of activity. (2) mode switches (<1%)---max dur. 10 sec, Max A 185, Max V 74---AT. No high ventricular rates noted. Patient educated about wound care, arm mobility, lifting restrictions. ROV in 3 months with WC.

## 2015-03-27 ENCOUNTER — Encounter: Payer: Self-pay | Admitting: Cardiology

## 2015-03-30 ENCOUNTER — Encounter: Payer: Self-pay | Admitting: Cardiology

## 2015-05-12 ENCOUNTER — Encounter: Payer: Self-pay | Admitting: Cardiology

## 2015-05-13 ENCOUNTER — Encounter: Payer: Self-pay | Admitting: Cardiology

## 2015-06-22 ENCOUNTER — Encounter: Payer: Self-pay | Admitting: Cardiology

## 2015-06-22 ENCOUNTER — Ambulatory Visit (INDEPENDENT_AMBULATORY_CARE_PROVIDER_SITE_OTHER): Payer: Medicare Other | Admitting: Cardiology

## 2015-06-22 VITALS — BP 136/74 | HR 80 | Ht 69.0 in | Wt 175.6 lb

## 2015-06-22 DIAGNOSIS — I4430 Unspecified atrioventricular block: Secondary | ICD-10-CM

## 2015-06-22 DIAGNOSIS — Z45018 Encounter for adjustment and management of other part of cardiac pacemaker: Secondary | ICD-10-CM | POA: Diagnosis not present

## 2015-06-22 LAB — CUP PACEART INCLINIC DEVICE CHECK
Battery Remaining Longevity: 104.4
Battery Voltage: 2.99 V
Brady Statistic RA Percent Paced: 49 %
Implantable Lead Implant Date: 20161003
Implantable Lead Location: 753860
Lead Channel Impedance Value: 437.5 Ohm
Lead Channel Impedance Value: 475 Ohm
Lead Channel Pacing Threshold Amplitude: 0.5 V
Lead Channel Pacing Threshold Amplitude: 0.5 V
Lead Channel Pacing Threshold Amplitude: 0.5 V
Lead Channel Pacing Threshold Amplitude: 0.5 V
Lead Channel Pacing Threshold Pulse Width: 0.4 ms
Lead Channel Pacing Threshold Pulse Width: 0.4 ms
Lead Channel Sensing Intrinsic Amplitude: 5.4 mV
Lead Channel Setting Pacing Pulse Width: 0.4 ms
MDC IDC LEAD IMPLANT DT: 20161003
MDC IDC LEAD LOCATION: 753859
MDC IDC MSMT LEADCHNL RA SENSING INTR AMPL: 5 mV
MDC IDC MSMT LEADCHNL RV PACING THRESHOLD PULSEWIDTH: 0.4 ms
MDC IDC MSMT LEADCHNL RV PACING THRESHOLD PULSEWIDTH: 0.4 ms
MDC IDC SESS DTM: 20170116151146
MDC IDC SET LEADCHNL RA PACING AMPLITUDE: 1.5 V
MDC IDC SET LEADCHNL RV PACING AMPLITUDE: 2.5 V
MDC IDC SET LEADCHNL RV SENSING SENSITIVITY: 2 mV
MDC IDC STAT BRADY RV PERCENT PACED: 99.77 %
Pulse Gen Serial Number: 7818292

## 2015-06-22 NOTE — Progress Notes (Signed)
Electrophysiology Office Note   Date:  06/22/2015   ID:  [name], DOB Sep 08, 1928, MRN [id number]  PCP:  Irven Shelling, MD  Primary Electrophysiologist:  Constance Haw, MD    Chief Complaint  Patient presents with  . Pacemaker Check     History of Present Illness: [name] is a 82 y.o. male who presents today for electrophysiology evaluation.   He initially presented in September with fatugue and was found to be in 2:1 heart block.  He had a pacemaker place din October.     Today, he denies symptoms of palpitations, chest pain, shortness of breath, orthopnea, PND, lower extremity edema, claudication, dizziness, presyncope, syncope, bleeding, or neurologic sequela. The patient is tolerating medications without difficulties and is otherwise without complaint today.    Past Medical History  Diagnosis Date  . Hypertension   . ED (erectile dysfunction)   . Melanoma ([place])     left abdomen   Past Surgical History  Procedure Laterality Date  . Nasal sinus surgery    . Ep implantable device N/A 03/09/2015    Procedure: Pacemaker Implant;  Surgeon: Will Meredith Leeds, MD;  Location: [facility];  Service: Cardiovascular;  Laterality: N/A;     Current Outpatient Prescriptions  Medication Sig Dispense Refill  . amLODipine (NORVASC) 10 MG tablet Take 10 mg by mouth daily.  3  . aspirin 81 MG tablet Take 81 mg by mouth daily.    . cholecalciferol (VITAMIN D) 1000 UNITS tablet Take 1,000 Units by mouth daily.    Marland Kitchen lisinopril (PRINIVIL,ZESTRIL) 20 MG tablet Take 20 mg by mouth daily.  3   No current facility-administered medications for this visit.    Allergies:   Avelox and Sulfa antibiotics   Social History:  The patient  reports that he has quit smoking. He has never used smokeless tobacco. He reports that he drinks alcohol. He reports that he does not use illicit drugs.   Family History:  The patient's family history includes Heart failure in his  mother; Hypertension in his father and mother.    ROS:  Please see the history of present illness.   All other systems are reviewed and negative.    PHYSICAL EXAM: VS:  BP 136/74 mmHg  Pulse 80  Ht 5\' 9"  (1.753 m)  Wt 175 lb 9.6 oz (79.652 kg)  BMI 25.92 kg/m2 , BMI Body mass index is 25.92 kg/(m^2). GEN: Well nourished, well developed, in no acute distress HEENT: normal Neck: no JVD, carotid bruits, or masses Cardiac: RRR; no murmurs, rubs, or gallops,no edema  Respiratory:  clear to auscultation bilaterally, normal work of breathing GI: soft, nontender, nondistended, + BS MS: no deformity or atrophy Skin: warm and dry,  device pocket is well healed Neuro:  Strength and sensation are intact Psych: euthymic mood, full affect  EKG:  EKG is ordered today. The ekg ordered today shows sinus rhythm, V paced   Device interrogation is reviewed today in detail.  See PaceArt for details.   Recent Labs: 03/05/2015: BUN 19; Creatinine, Ser 1.20; Hemoglobin 14.1; Platelets 172.0; Potassium 4.6; Sodium 143    Lipid Panel  No results found for: CHOL, TRIG, HDL, CHOLHDL, VLDL, LDLCALC, LDLDIRECT   Wt Readings from Last 3 Encounters:  06/22/15 175 lb 9.6 oz (79.652 kg)  03/10/15 167 lb 6.4 oz (75.932 kg)  03/05/15 174 lb (78.926 kg)   ASSESSMENT AND PLAN:  1.  2:1 AV block: had dual chamber pacemaker placed on  0000000 without complication.  Feels much improved with significantly less fatigue.  Had 2 mode switches with the longest of 40 seconds, appears to be atrial fibrillation.  will continue to monitor burden to see if he requires anticoagulation.  Has CHADS2VASc of 3.  No changes necessary to the device today.  Will continue with current management.  Current medicines are reviewed at length with the patient today.   The patient does not have concerns regarding his medicines.  The following changes were made today:  none  Labs/ tests ordered today include:  No orders of the defined  types were placed in this encounter.     Disposition:   FU with Will Camnitz 9  months  Signed, Will Meredith Leeds, MD  06/22/2015 2:44 PM     [facility] [address] [place] [address] [phone] (office) [phone] (fax)

## 2015-06-22 NOTE — Patient Instructions (Signed)
Medication Instructions:  Your physician recommends that you continue on your current medications as directed. Please refer to the Current Medication list given to you today.  Labwork: None ordered  Testing/Procedures: None ordered  Follow-Up: Remote monitoring is used to monitor your Pacemaker of ICD from home. This monitoring reduces the number of office visits required to check your device to one time per year. It allows [place] to keep an eye on the functioning of your device to ensure it is working properly. You are scheduled for a device check from home on 09/21/15. You may send your transmission at any time that day. If you have a wireless device, the transmission will be sent automatically. After your physician reviews your transmission, you will receive a postcard with your next transmission date.  Your physician wants you to follow-up in: 9 months with Dr. Curt Bears.  You will receive a reminder letter in the mail two months in advance. If you don't receive a letter, please call our office to schedule the follow-up appointment.  If you need a refill on your cardiac medications before your next appointment, please call your pharmacy.  Thank you for choosing [facility]!!   Trinidad Curet, RN [phone]

## 2015-09-21 ENCOUNTER — Ambulatory Visit (INDEPENDENT_AMBULATORY_CARE_PROVIDER_SITE_OTHER): Payer: Medicare Other | Admitting: *Deleted

## 2015-09-21 ENCOUNTER — Telehealth: Payer: Self-pay | Admitting: Cardiology

## 2015-09-21 DIAGNOSIS — I4430 Unspecified atrioventricular block: Secondary | ICD-10-CM

## 2015-09-21 NOTE — Telephone Encounter (Signed)
Attempted to confirm remote transmission with pt. No answer and was unable to leave a message.   

## 2015-09-22 NOTE — Progress Notes (Signed)
Remote pacemaker transmission.   

## 2015-10-29 ENCOUNTER — Encounter: Payer: Self-pay | Admitting: Cardiology

## 2015-10-29 LAB — CUP PACEART REMOTE DEVICE CHECK
Battery Remaining Percentage: 95.5 %
Brady Statistic AP VP Percent: 66 %
Brady Statistic AP VS Percent: 1 %
Brady Statistic AS VP Percent: 34 %
Brady Statistic AS VS Percent: 1 %
Implantable Lead Implant Date: 20161003
Implantable Lead Location: 753859
Lead Channel Impedance Value: 450 Ohm
Lead Channel Pacing Threshold Amplitude: 0.625 V
Lead Channel Pacing Threshold Pulse Width: 0.4 ms
Lead Channel Setting Pacing Amplitude: 1.625
Lead Channel Setting Pacing Amplitude: 2.5 V
MDC IDC LEAD IMPLANT DT: 20161003
MDC IDC LEAD LOCATION: 753860
MDC IDC MSMT BATTERY REMAINING LONGEVITY: 107 mo
MDC IDC MSMT BATTERY VOLTAGE: 2.99 V
MDC IDC MSMT LEADCHNL RA IMPEDANCE VALUE: 460 Ohm
MDC IDC MSMT LEADCHNL RA SENSING INTR AMPL: 5 mV
MDC IDC PG SERIAL: 7818292
MDC IDC SESS DTM: 20170418110247
MDC IDC SET LEADCHNL RV PACING PULSEWIDTH: 0.4 ms
MDC IDC SET LEADCHNL RV SENSING SENSITIVITY: 2 mV
MDC IDC STAT BRADY RA PERCENT PACED: 65 %
MDC IDC STAT BRADY RV PERCENT PACED: 99 %
Pulse Gen Model: 2240

## 2015-11-10 DIAGNOSIS — H524 Presbyopia: Secondary | ICD-10-CM | POA: Diagnosis not present

## 2015-11-10 DIAGNOSIS — H40153 Residual stage of open-angle glaucoma, bilateral: Secondary | ICD-10-CM | POA: Diagnosis not present

## 2015-12-09 DIAGNOSIS — I10 Essential (primary) hypertension: Secondary | ICD-10-CM | POA: Diagnosis not present

## 2015-12-21 ENCOUNTER — Ambulatory Visit (INDEPENDENT_AMBULATORY_CARE_PROVIDER_SITE_OTHER): Payer: Medicare Other | Admitting: *Deleted

## 2015-12-21 DIAGNOSIS — I4430 Unspecified atrioventricular block: Secondary | ICD-10-CM | POA: Diagnosis not present

## 2015-12-21 NOTE — Progress Notes (Signed)
Remote pacemaker transmission.   

## 2015-12-23 ENCOUNTER — Encounter: Payer: Self-pay | Admitting: Cardiology

## 2015-12-24 LAB — CUP PACEART REMOTE DEVICE CHECK
Battery Remaining Longevity: 103 mo
Battery Remaining Percentage: 95.5 %
Brady Statistic AP VP Percent: 65 %
Brady Statistic AS VP Percent: 34 %
Brady Statistic AS VS Percent: 1 %
Brady Statistic RV Percent Paced: 99 %
Date Time Interrogation Session: 20170717074007
Implantable Lead Implant Date: 20161003
Implantable Lead Location: 753859
Lead Channel Impedance Value: 430 Ohm
Lead Channel Setting Pacing Amplitude: 1.5 V
Lead Channel Setting Pacing Amplitude: 2.5 V
Lead Channel Setting Pacing Pulse Width: 0.4 ms
Lead Channel Setting Sensing Sensitivity: 2 mV
MDC IDC LEAD IMPLANT DT: 20161003
MDC IDC LEAD LOCATION: 753860
MDC IDC MSMT BATTERY VOLTAGE: 2.99 V
MDC IDC MSMT LEADCHNL RA IMPEDANCE VALUE: 440 Ohm
MDC IDC MSMT LEADCHNL RA PACING THRESHOLD AMPLITUDE: 0.5 V
MDC IDC MSMT LEADCHNL RA PACING THRESHOLD PULSEWIDTH: 0.4 ms
MDC IDC MSMT LEADCHNL RA SENSING INTR AMPL: 5 mV
MDC IDC PG SERIAL: 7818292
MDC IDC STAT BRADY AP VS PERCENT: 1 %
MDC IDC STAT BRADY RA PERCENT PACED: 65 %
Pulse Gen Model: 2240

## 2016-02-03 DIAGNOSIS — K1329 Other disturbances of oral epithelium, including tongue: Secondary | ICD-10-CM | POA: Diagnosis not present

## 2016-02-04 DIAGNOSIS — K1329 Other disturbances of oral epithelium, including tongue: Secondary | ICD-10-CM | POA: Diagnosis not present

## 2016-03-21 ENCOUNTER — Ambulatory Visit (INDEPENDENT_AMBULATORY_CARE_PROVIDER_SITE_OTHER): Payer: Medicare Other | Admitting: *Deleted

## 2016-03-21 DIAGNOSIS — I4430 Unspecified atrioventricular block: Secondary | ICD-10-CM | POA: Diagnosis not present

## 2016-03-21 NOTE — Progress Notes (Signed)
Remote pacemaker transmission.   

## 2016-03-22 DIAGNOSIS — H40013 Open angle with borderline findings, low risk, bilateral: Secondary | ICD-10-CM | POA: Diagnosis not present

## 2016-03-23 ENCOUNTER — Encounter: Payer: Self-pay | Admitting: Cardiology

## 2016-04-01 LAB — CUP PACEART REMOTE DEVICE CHECK
Battery Remaining Longevity: 110 mo
Battery Remaining Percentage: 95.5 %
Brady Statistic AP VS Percent: 1 %
Brady Statistic AS VS Percent: 1 %
Brady Statistic RV Percent Paced: 99 %
Implantable Lead Location: 753860
Lead Channel Impedance Value: 450 Ohm
Lead Channel Pacing Threshold Amplitude: 0.5 V
Lead Channel Pacing Threshold Pulse Width: 0.4 ms
Lead Channel Sensing Intrinsic Amplitude: 5 mV
Lead Channel Setting Pacing Pulse Width: 0.4 ms
Lead Channel Setting Sensing Sensitivity: 2 mV
MDC IDC LEAD IMPLANT DT: 20161003
MDC IDC LEAD IMPLANT DT: 20161003
MDC IDC LEAD LOCATION: 753859
MDC IDC MSMT BATTERY VOLTAGE: 2.99 V
MDC IDC MSMT LEADCHNL RA PACING THRESHOLD AMPLITUDE: 0.625 V
MDC IDC MSMT LEADCHNL RV IMPEDANCE VALUE: 430 Ohm
MDC IDC MSMT LEADCHNL RV PACING THRESHOLD PULSEWIDTH: 0.4 ms
MDC IDC MSMT LEADCHNL RV SENSING INTR AMPL: 5.4 mV
MDC IDC SESS DTM: 20171016062150
MDC IDC SET LEADCHNL RA PACING AMPLITUDE: 1.625
MDC IDC SET LEADCHNL RV PACING AMPLITUDE: 2.5 V
MDC IDC STAT BRADY AP VP PERCENT: 64 %
MDC IDC STAT BRADY AS VP PERCENT: 35 %
MDC IDC STAT BRADY RA PERCENT PACED: 64 %
Pulse Gen Model: 2240
Pulse Gen Serial Number: 7818292

## 2016-04-05 ENCOUNTER — Encounter: Payer: Self-pay | Admitting: Cardiology

## 2016-04-20 ENCOUNTER — Encounter: Payer: Self-pay | Admitting: Cardiology

## 2016-04-20 ENCOUNTER — Ambulatory Visit (INDEPENDENT_AMBULATORY_CARE_PROVIDER_SITE_OTHER): Payer: Medicare Other | Admitting: Cardiology

## 2016-04-20 VITALS — BP 154/82 | HR 66 | Ht 70.0 in | Wt 174.0 lb

## 2016-04-20 DIAGNOSIS — I441 Atrioventricular block, second degree: Secondary | ICD-10-CM | POA: Diagnosis not present

## 2016-04-20 LAB — CUP PACEART INCLINIC DEVICE CHECK
Battery Remaining Longevity: 103 mo
Date Time Interrogation Session: 20171115163023
Implantable Lead Implant Date: 20161003
Implantable Lead Implant Date: 20161003
Implantable Lead Location: 753859
Implantable Pulse Generator Implant Date: 20161003
Lead Channel Impedance Value: 412.5 Ohm
Lead Channel Pacing Threshold Amplitude: 0.75 V
Lead Channel Pacing Threshold Pulse Width: 0.4 ms
Lead Channel Setting Pacing Amplitude: 2.5 V
Lead Channel Setting Sensing Sensitivity: 2 mV
MDC IDC LEAD LOCATION: 753860
MDC IDC MSMT BATTERY VOLTAGE: 2.99 V
MDC IDC MSMT LEADCHNL RA IMPEDANCE VALUE: 450 Ohm
MDC IDC MSMT LEADCHNL RA PACING THRESHOLD AMPLITUDE: 0.5 V
MDC IDC MSMT LEADCHNL RA SENSING INTR AMPL: 5 mV — AB
MDC IDC MSMT LEADCHNL RV PACING THRESHOLD PULSEWIDTH: 0.4 ms
MDC IDC MSMT LEADCHNL RV SENSING INTR AMPL: 4.5 mV
MDC IDC SET LEADCHNL RA PACING AMPLITUDE: 1.5 V
MDC IDC SET LEADCHNL RV PACING PULSEWIDTH: 0.4 ms
MDC IDC STAT BRADY RA PERCENT PACED: 64 %
MDC IDC STAT BRADY RV PERCENT PACED: 99.44 %
Pulse Gen Model: 2240
Pulse Gen Serial Number: 7818292

## 2016-04-20 MED ORDER — APIXABAN 5 MG PO TABS: 5.0000 mg | ORAL_TABLET | Freq: Two times a day (BID) | ORAL | 6 refills | Status: DC

## 2016-04-20 NOTE — Patient Instructions (Addendum)
Medication Instructions:  Your physician has recommended you make the following change in your medication:  Start Eliquis 5 mg by mouth twice daily.  Stop Aspirin    Labwork: none  Testing/Procedures: none  Follow-Up: Your physician wants you to follow-up in: 6 months.  You will receive a reminder letter in the mail two months in advance. If you don't receive a letter, please call our office to schedule the follow-up appointment.   Any Other Special Instructions Will Be Listed Below (If Applicable).     If you need a refill on your cardiac medications before your next appointment, please call your pharmacy.

## 2016-04-20 NOTE — Progress Notes (Signed)
Electrophysiology Office Note   Date:  04/22/2016   ID:  [name], DOB [birth year], MRN [id number]  PCP:  Irven Shelling, MD  Primary Electrophysiologist:  Constance Haw, MD    Chief Complaint  Patient presents with  . Pacemaker Check    AFib     History of Present Illness: [name] is a 82 y.o. male who presents today for electrophysiology evaluation.   He initially presented in September with fatugue and was found to be in 2:1 heart block.  He had a pacemaker placed in October 2016.  He has been doing well without any issues. It was noted that he had atrial fibrillation on his device interrogation. He has not had any issues with palpitations, shortness of breath, or fatigue.   Today, he denies symptoms of palpitations, chest pain, shortness of breath, orthopnea, PND, lower extremity edema, claudication, dizziness, presyncope, syncope, bleeding, or neurologic sequela. The patient is tolerating medications without difficulties and is otherwise without complaint today.    Past Medical History:  Diagnosis Date  . ED (erectile dysfunction)   . Hypertension   . Melanoma ([place])    left abdomen   Past Surgical History:  Procedure Laterality Date  . EP IMPLANTABLE DEVICE N/A 03/09/2015   Procedure: Pacemaker Implant;  Surgeon: Cyenna Rebello Meredith Leeds, MD;  Location: [facility];  Service: Cardiovascular;  Laterality: N/A;  . NASAL SINUS SURGERY       Current Outpatient Prescriptions  Medication Sig Dispense Refill  . amLODipine (NORVASC) 10 MG tablet Take 10 mg by mouth daily.  3  . cholecalciferol (VITAMIN D) 1000 UNITS tablet Take 1,000 Units by mouth daily.    Marland Kitchen lisinopril (PRINIVIL,ZESTRIL) 20 MG tablet Take 20 mg by mouth daily.  3  . apixaban (ELIQUIS) 5 MG TABS tablet Take 1 tablet (5 mg total) by mouth 2 (two) times daily. 60 tablet 6   No current facility-administered medications for this visit.     Allergies:   Avelox [moxifloxacin hcl in  nacl] and Sulfa antibiotics   Social History:  The patient  reports that he has quit smoking. He has never used smokeless tobacco. He reports that he drinks alcohol. He reports that he does not use drugs.   Family History:  The patient's family history includes Heart failure in his mother; Hypertension in his father and mother.    ROS:  Please see the history of present illness.   All other systems are reviewed and positive for sweats, constipation   PHYSICAL EXAM: VS:  BP (!) 154/82   Pulse 66   Ht 5\' 10"  (1.778 m)   Wt 174 lb (78.9 kg)   BMI 24.97 kg/m  , BMI Body mass index is 24.97 kg/m. GEN: Well nourished, well developed, in no acute distress  HEENT: normal  Neck: no JVD, carotid bruits, or masses Cardiac: RRR; no murmurs, rubs, or gallops,no edema  Respiratory:  clear to auscultation bilaterally, normal work of breathing GI: soft, nontender, nondistended, + BS MS: no deformity or atrophy  Skin: warm and dry,  device pocket is well healed Neuro:  Strength and sensation are intact Psych: euthymic mood, full affect  EKG:  EKG is ordered today. Personal review of the ekg ordered today shows sinus rhythm, V paced   Device interrogation is reviewed today in detail.  See PaceArt for details.   Recent Labs: No results found for requested labs within last 8760 hours.    Lipid Panel  No results found  for: CHOL, TRIG, HDL, CHOLHDL, VLDL, LDLCALC, LDLDIRECT   Wt Readings from Last 3 Encounters:  04/20/16 174 lb (78.9 kg)  06/22/15 175 lb 9.6 oz (79.7 kg)  03/10/15 167 lb 6.4 oz (75.9 kg)   ASSESSMENT AND PLAN:  1.  2:1 AV block: had dual chamber pacemaker placed on 0000000 without complication.  His R waves on his device interrogation are significantly lower than that were before. That being said, his ventricular rate is in the low 30s and he paces all the time. Reeshemah Nazaryan not make any further changes to programming.  2. Hypertension: Elevated today but generally  well-controlled at home. He'll continue to take his blood pressure at home and Bethan Adamek call us if it's consistently in the 140s to 150s.  3. Paroxysmal atrial fibrillation: Found on device interrogation. I discussed with him the importance of anticoagulation. We Ryley Bachtel start him on Eliquis today.  This patients CHA2DS2-VASc Score and unadjusted Ischemic Stroke Rate (% per year) is equal to 3.2 % stroke rate/year from a score of 3  Above score calculated as 1 point each if present [CHF, HTN, DM, Vascular=MI/PAD/Aortic Plaque, Age if 65-74, or Male] Above score calculated as 2 points each if present [Age > 75, or Stroke/TIA/TE]     Current medicines are reviewed at length with the patient today.   The patient does not have concerns regarding his medicines.  The following changes were made today:  Eliquis  Labs/ tests ordered today include:  Orders Placed This Encounter  Procedures  . Implantable device check  . EKG 12-Lead     Disposition:   FU with [place] Monestime 6 months  Signed, Dyamond Tolosa Meredith Leeds, MD  04/22/2016 7:09 AM     [facility] [address] [place] [address] [phone] (office) [phone] (fax)

## 2016-04-22 ENCOUNTER — Telehealth: Payer: Self-pay | Admitting: Cardiology

## 2016-04-22 NOTE — Telephone Encounter (Signed)
Calling stating that he saw Dr. Curt Bears on 11/15 and was started on Eliquis.  States he understood that the dose was 2.5 mg but when he received the Rx it was for 5 mg.  Advised that is correct dosage per his office note.  He verbalizes understanding and will take as prescribed.

## 2016-04-22 NOTE — Telephone Encounter (Signed)
New message  Pt call requesting to speak with Rn about his office visit on 11/15. Pt states he want to verify the correct dosage for medication that was prescribed to him during this appt. Please call back to discuss

## 2016-05-16 DIAGNOSIS — C004 Malignant neoplasm of lower lip, inner aspect: Secondary | ICD-10-CM | POA: Diagnosis not present

## 2016-05-23 DIAGNOSIS — C069 Malignant neoplasm of mouth, unspecified: Secondary | ICD-10-CM | POA: Diagnosis not present

## 2016-05-23 DIAGNOSIS — C001 Malignant neoplasm of external lower lip: Secondary | ICD-10-CM | POA: Diagnosis not present

## 2016-06-14 DIAGNOSIS — Z0000 Encounter for general adult medical examination without abnormal findings: Secondary | ICD-10-CM | POA: Diagnosis not present

## 2016-06-14 DIAGNOSIS — I480 Paroxysmal atrial fibrillation: Secondary | ICD-10-CM | POA: Diagnosis not present

## 2016-06-14 DIAGNOSIS — Z1389 Encounter for screening for other disorder: Secondary | ICD-10-CM | POA: Diagnosis not present

## 2016-06-14 DIAGNOSIS — I10 Essential (primary) hypertension: Secondary | ICD-10-CM | POA: Diagnosis not present

## 2016-06-14 DIAGNOSIS — C4402 Squamous cell carcinoma of skin of lip: Secondary | ICD-10-CM | POA: Diagnosis not present

## 2016-07-20 ENCOUNTER — Ambulatory Visit (INDEPENDENT_AMBULATORY_CARE_PROVIDER_SITE_OTHER): Payer: Medicare Other | Admitting: *Deleted

## 2016-07-20 DIAGNOSIS — I4430 Unspecified atrioventricular block: Secondary | ICD-10-CM

## 2016-07-20 NOTE — Progress Notes (Signed)
Remote pacemaker transmission.   

## 2016-07-21 ENCOUNTER — Encounter: Payer: Self-pay | Admitting: Cardiology

## 2016-07-27 DIAGNOSIS — C004 Malignant neoplasm of lower lip, inner aspect: Secondary | ICD-10-CM | POA: Diagnosis not present

## 2016-07-29 DIAGNOSIS — C004 Malignant neoplasm of lower lip, inner aspect: Secondary | ICD-10-CM | POA: Diagnosis not present

## 2016-07-29 LAB — CUP PACEART REMOTE DEVICE CHECK
Battery Remaining Percentage: 95.5 %
Battery Voltage: 2.99 V
Brady Statistic AP VP Percent: 66 %
Brady Statistic RA Percent Paced: 65 %
Date Time Interrogation Session: 20180214074942
Implantable Lead Implant Date: 20161003
Implantable Lead Location: 753859
Implantable Lead Location: 753860
Implantable Pulse Generator Implant Date: 20161003
Lead Channel Impedance Value: 410 Ohm
Lead Channel Pacing Threshold Amplitude: 0.5 V
Lead Channel Pacing Threshold Pulse Width: 0.4 ms
Lead Channel Setting Sensing Sensitivity: 2 mV
MDC IDC LEAD IMPLANT DT: 20161003
MDC IDC MSMT BATTERY REMAINING LONGEVITY: 109 mo
MDC IDC MSMT LEADCHNL RA IMPEDANCE VALUE: 450 Ohm
MDC IDC MSMT LEADCHNL RA SENSING INTR AMPL: 5 mV
MDC IDC MSMT LEADCHNL RV PACING THRESHOLD AMPLITUDE: 0.75 V
MDC IDC MSMT LEADCHNL RV PACING THRESHOLD PULSEWIDTH: 0.4 ms
MDC IDC MSMT LEADCHNL RV SENSING INTR AMPL: 9.9 mV
MDC IDC SET LEADCHNL RA PACING AMPLITUDE: 1.5 V
MDC IDC SET LEADCHNL RV PACING AMPLITUDE: 2.5 V
MDC IDC SET LEADCHNL RV PACING PULSEWIDTH: 0.4 ms
MDC IDC STAT BRADY AP VS PERCENT: 1 %
MDC IDC STAT BRADY AS VP PERCENT: 34 %
MDC IDC STAT BRADY AS VS PERCENT: 1 %
MDC IDC STAT BRADY RV PERCENT PACED: 99 %
Pulse Gen Model: 2240
Pulse Gen Serial Number: 7818292

## 2016-08-29 DIAGNOSIS — H903 Sensorineural hearing loss, bilateral: Secondary | ICD-10-CM | POA: Diagnosis not present

## 2016-10-24 ENCOUNTER — Ambulatory Visit (INDEPENDENT_AMBULATORY_CARE_PROVIDER_SITE_OTHER): Payer: Medicare Other | Admitting: Cardiology

## 2016-10-24 ENCOUNTER — Encounter: Payer: Self-pay | Admitting: Cardiology

## 2016-10-24 ENCOUNTER — Encounter (INDEPENDENT_AMBULATORY_CARE_PROVIDER_SITE_OTHER): Payer: Self-pay

## 2016-10-24 VITALS — BP 136/76 | HR 67 | Ht 69.0 in | Wt 174.6 lb

## 2016-10-24 DIAGNOSIS — I10 Essential (primary) hypertension: Secondary | ICD-10-CM | POA: Diagnosis not present

## 2016-10-24 DIAGNOSIS — I4430 Unspecified atrioventricular block: Secondary | ICD-10-CM

## 2016-10-24 DIAGNOSIS — Z45018 Encounter for adjustment and management of other part of cardiac pacemaker: Secondary | ICD-10-CM | POA: Diagnosis not present

## 2016-10-24 DIAGNOSIS — C439 Malignant melanoma of skin, unspecified: Secondary | ICD-10-CM | POA: Insufficient documentation

## 2016-10-24 DIAGNOSIS — I480 Paroxysmal atrial fibrillation: Secondary | ICD-10-CM | POA: Diagnosis not present

## 2016-10-24 DIAGNOSIS — Z85820 Personal history of malignant melanoma of skin: Secondary | ICD-10-CM | POA: Insufficient documentation

## 2016-10-24 LAB — CUP PACEART INCLINIC DEVICE CHECK
Battery Voltage: 2.99 V
Brady Statistic RA Percent Paced: 68 %
Brady Statistic RV Percent Paced: 99.62 %
Implantable Lead Implant Date: 20161003
Lead Channel Impedance Value: 425 Ohm
Lead Channel Pacing Threshold Amplitude: 0.5 V
Lead Channel Pacing Threshold Amplitude: 0.75 V
Lead Channel Pacing Threshold Pulse Width: 0.4 ms
Lead Channel Sensing Intrinsic Amplitude: 5 mV
Lead Channel Setting Pacing Amplitude: 2.5 V
MDC IDC LEAD IMPLANT DT: 20161003
MDC IDC LEAD LOCATION: 753859
MDC IDC LEAD LOCATION: 753860
MDC IDC MSMT LEADCHNL RV IMPEDANCE VALUE: 412.5 Ohm
MDC IDC MSMT LEADCHNL RV PACING THRESHOLD PULSEWIDTH: 0.4 ms
MDC IDC MSMT LEADCHNL RV SENSING INTR AMPL: 3 mV
MDC IDC PG IMPLANT DT: 20161003
MDC IDC SESS DTM: 20180521172154
MDC IDC SET LEADCHNL RA PACING AMPLITUDE: 1.5 V
MDC IDC SET LEADCHNL RV PACING PULSEWIDTH: 0.4 ms
MDC IDC SET LEADCHNL RV SENSING SENSITIVITY: 2 mV
Pulse Gen Serial Number: 7818292

## 2016-10-24 NOTE — Patient Instructions (Signed)
Medication Instructions:    Your physician recommends that you continue on your current medications as directed. Please refer to the Current Medication list given to you today.  --- If you need a refill on your cardiac medications before your next appointment, please call your pharmacy. ---  Labwork:  None ordered  Testing/Procedures:  None ordered  Follow-Up: Remote monitoring is used to monitor your Pacemaker of ICD from home. This monitoring reduces the number of office visits required to check your device to one time per year. It allows [place] to keep an eye on the functioning of your device to ensure it is working properly. You are scheduled for a device check from home on 01/23/2017. You may send your transmission at any time that day. If you have a wireless device, the transmission will be sent automatically. After your physician reviews your transmission, you will receive a postcard with your next transmission date.   Your physician wants you to follow-up in: 6 months with Dr. Curt Bears.  You will receive a reminder letter in the mail two months in advance. If you don't receive a letter, please call our office to schedule the follow-up appointment.  Thank you for choosing [facility]!!   Trinidad Curet, RN [phone]

## 2016-10-24 NOTE — Progress Notes (Signed)
Electrophysiology Office Note   Date:  10/24/2016   ID:  [name], DOB 08-Jan-1929, MRN [id number]  PCP:  Lavone Orn, MD  Primary Electrophysiologist:  Lismary Kiehn Meredith Leeds, MD    Chief Complaint  Patient presents with  . Follow-up    Atrioventricular block     History of Present Illness: [name] is a 83 y.o. male who presents today for electrophysiology evaluation.   He initially presented in September with fatugue and was found to be in 2:1 heart block.  He had a pacemaker placed in October 2016.  He has been doing well without major issues. Is tolerating his medications without problems. Was recently placed on Eliquis for atrial fibrillation and had no bleeding issues.  Today, denies symptoms of palpitations, chest pain, shortness of breath, orthopnea, PND, lower extremity edema, claudication, dizziness, presyncope, syncope, bleeding, or neurologic sequela. The patient is tolerating medications without difficulties and is otherwise without complaint today.     Past Medical History:  Diagnosis Date  . ED (erectile dysfunction)   . Hypertension   . Melanoma ([place])    left abdomen   Past Surgical History:  Procedure Laterality Date  . EP IMPLANTABLE DEVICE N/A 03/09/2015   Procedure: Pacemaker Implant;  Surgeon: Keyanah Kozicki Meredith Leeds, MD;  Location: [facility];  Service: Cardiovascular;  Laterality: N/A;  . NASAL SINUS SURGERY       Current Outpatient Prescriptions  Medication Sig Dispense Refill  . amLODipine (NORVASC) 10 MG tablet Take 10 mg by mouth daily.  3  . apixaban (ELIQUIS) 5 MG TABS tablet Take 1 tablet (5 mg total) by mouth 2 (two) times daily. 60 tablet 6  . cholecalciferol (VITAMIN D) 1000 UNITS tablet Take 1,000 Units by mouth daily.    Marland Kitchen lisinopril (PRINIVIL,ZESTRIL) 20 MG tablet Take 20 mg by mouth daily.  3   No current facility-administered medications for this visit.     Allergies:   Avelox [moxifloxacin hcl in nacl] and Sulfa  antibiotics   Social History:  The patient  reports that he has quit smoking. He has never used smokeless tobacco. He reports that he drinks alcohol. He reports that he does not use drugs.   Family History:  The patient's family history includes Heart failure in his mother; Hypertension in his father and mother.    ROS:  Please see the history of present illness.   Otherwise, review of systems is positive for hearing loss, constipation, back pain, rash, easy bruising.   All other systems are reviewed and negative.     PHYSICAL EXAM: VS:  BP 136/76   Pulse 67   Ht 5\' 9"  (1.753 m)   Wt 174 lb 9.6 oz (79.2 kg)   BMI 25.78 kg/m  , BMI Body mass index is 25.78 kg/m. GEN: Well nourished, well developed, in no acute distress  HEENT: normal  Neck: no JVD, carotid bruits, or masses Cardiac: RRR; no murmurs, rubs, or gallops,no edema  Respiratory:  clear to auscultation bilaterally, normal work of breathing GI: soft, nontender, nondistended, + BS MS: no deformity or atrophy  Skin: warm and dry, device site well healed Neuro:  Strength and sensation are intact Psych: euthymic mood, full affect  EKG:  EKG is ordered today. Personal review of the ekg ordered shows sinus rhythm, V paced   Personal review of the device interrogation today. Results in [place]: No results found for requested labs within last 8760 hours.  Lipid Panel  No results found for: CHOL, TRIG, HDL, CHOLHDL, VLDL, LDLCALC, LDLDIRECT   Wt Readings from Last 3 Encounters:  10/24/16 174 lb 9.6 oz (79.2 kg)  04/20/16 174 lb (78.9 kg)  06/22/15 175 lb 9.6 oz (79.7 kg)   ASSESSMENT AND PLAN:  1.  2:1 AV block: Status post St. Jude dual-chamber pacemaker implanted in 2016. R waves have decreased, but he is pacing in the ventricle 99% of the time. No changes at this point to pacemaker programming.  2. Hypertension: Well-controlled today. No changes at the moment.  3. Paroxysmal atrial  fibrillation: Early on Eliquis without any major issues. Continue current management. Asymptomatic from atrial fibrillation.  This patients CHA2DS2-VASc Score and unadjusted Ischemic Stroke Rate (% per year) is equal to 3.2 % stroke rate/year from a score of 3  Above score calculated as 1 point each if present [CHF, HTN, DM, Vascular=MI/PAD/Aortic Plaque, Age if 65-74, or Male] Above score calculated as 2 points each if present [Age > 75, or Stroke/TIA/TE]   Current medicines are reviewed at length with the patient today.   The patient does not have concerns regarding his medicines.  The following changes were made today:    Labs/ tests ordered today include:  Orders Placed This Encounter  Procedures  . EKG 12-Lead     Disposition:   FU with Catie Chiao 6 months  Signed, Niara Bunker Meredith Leeds, MD  10/24/2016 4:23 PM     [address] [place] [address] [phone] (office) [phone] (fax)

## 2016-11-14 DIAGNOSIS — H40153 Residual stage of open-angle glaucoma, bilateral: Secondary | ICD-10-CM | POA: Diagnosis not present

## 2016-11-14 DIAGNOSIS — H524 Presbyopia: Secondary | ICD-10-CM | POA: Diagnosis not present

## 2016-12-05 ENCOUNTER — Other Ambulatory Visit: Payer: Self-pay | Admitting: Cardiology

## 2016-12-05 DIAGNOSIS — I441 Atrioventricular block, second degree: Secondary | ICD-10-CM

## 2016-12-20 DIAGNOSIS — I10 Essential (primary) hypertension: Secondary | ICD-10-CM | POA: Diagnosis not present

## 2017-01-23 ENCOUNTER — Ambulatory Visit (INDEPENDENT_AMBULATORY_CARE_PROVIDER_SITE_OTHER): Payer: Medicare Other | Admitting: *Deleted

## 2017-01-23 DIAGNOSIS — I4430 Unspecified atrioventricular block: Secondary | ICD-10-CM

## 2017-01-24 NOTE — Progress Notes (Signed)
Remote pacemaker transmission.   

## 2017-02-01 LAB — CUP PACEART REMOTE DEVICE CHECK
Battery Remaining Longevity: 107 mo
Brady Statistic AP VS Percent: 1 %
Brady Statistic AS VP Percent: 31 %
Brady Statistic AS VS Percent: 1 %
Date Time Interrogation Session: 20180820071210
Implantable Lead Implant Date: 20161003
Implantable Lead Location: 753860
Implantable Pulse Generator Implant Date: 20161003
Lead Channel Pacing Threshold Amplitude: 0.5 V
Lead Channel Pacing Threshold Amplitude: 0.75 V
Lead Channel Pacing Threshold Pulse Width: 0.4 ms
Lead Channel Pacing Threshold Pulse Width: 0.4 ms
Lead Channel Sensing Intrinsic Amplitude: 6.6 mV
Lead Channel Setting Pacing Amplitude: 1.5 V
Lead Channel Setting Sensing Sensitivity: 2 mV
MDC IDC LEAD IMPLANT DT: 20161003
MDC IDC LEAD LOCATION: 753859
MDC IDC MSMT BATTERY REMAINING PERCENTAGE: 95.5 %
MDC IDC MSMT BATTERY VOLTAGE: 2.99 V
MDC IDC MSMT LEADCHNL RA IMPEDANCE VALUE: 410 Ohm
MDC IDC MSMT LEADCHNL RA SENSING INTR AMPL: 5 mV
MDC IDC MSMT LEADCHNL RV IMPEDANCE VALUE: 390 Ohm
MDC IDC SET LEADCHNL RV PACING AMPLITUDE: 2.5 V
MDC IDC SET LEADCHNL RV PACING PULSEWIDTH: 0.4 ms
MDC IDC STAT BRADY AP VP PERCENT: 69 %
MDC IDC STAT BRADY RA PERCENT PACED: 68 %
MDC IDC STAT BRADY RV PERCENT PACED: 99 %
Pulse Gen Model: 2240
Pulse Gen Serial Number: 7818292

## 2017-02-03 ENCOUNTER — Encounter: Payer: Self-pay | Admitting: Cardiology

## 2017-03-16 DIAGNOSIS — Z23 Encounter for immunization: Secondary | ICD-10-CM | POA: Diagnosis not present

## 2017-03-22 DIAGNOSIS — H40153 Residual stage of open-angle glaucoma, bilateral: Secondary | ICD-10-CM | POA: Diagnosis not present

## 2017-04-17 DIAGNOSIS — J069 Acute upper respiratory infection, unspecified: Secondary | ICD-10-CM | POA: Diagnosis not present

## 2017-04-24 ENCOUNTER — Ambulatory Visit (INDEPENDENT_AMBULATORY_CARE_PROVIDER_SITE_OTHER): Payer: Medicare Other | Admitting: *Deleted

## 2017-04-24 DIAGNOSIS — I4430 Unspecified atrioventricular block: Secondary | ICD-10-CM

## 2017-04-25 ENCOUNTER — Encounter: Payer: Self-pay | Admitting: Cardiology

## 2017-04-25 ENCOUNTER — Ambulatory Visit: Payer: Medicare Other | Admitting: Cardiology

## 2017-04-25 VITALS — BP 130/82 | HR 79 | Ht 69.0 in | Wt 169.0 lb

## 2017-04-25 DIAGNOSIS — I4430 Unspecified atrioventricular block: Secondary | ICD-10-CM | POA: Diagnosis not present

## 2017-04-25 DIAGNOSIS — I10 Essential (primary) hypertension: Secondary | ICD-10-CM

## 2017-04-25 DIAGNOSIS — Z45018 Encounter for adjustment and management of other part of cardiac pacemaker: Secondary | ICD-10-CM

## 2017-04-25 DIAGNOSIS — I480 Paroxysmal atrial fibrillation: Secondary | ICD-10-CM | POA: Diagnosis not present

## 2017-04-25 LAB — CUP PACEART INCLINIC DEVICE CHECK
Battery Remaining Longevity: 103 mo
Battery Voltage: 2.99 V
Brady Statistic RA Percent Paced: 67 %
Implantable Lead Location: 753860
Lead Channel Pacing Threshold Amplitude: 0.5 V
Lead Channel Pacing Threshold Pulse Width: 0.4 ms
Lead Channel Pacing Threshold Pulse Width: 0.4 ms
Lead Channel Sensing Intrinsic Amplitude: 3.2 mV
Lead Channel Sensing Intrinsic Amplitude: 5 mV
Lead Channel Setting Pacing Amplitude: 1.5 V
Lead Channel Setting Pacing Pulse Width: 0.4 ms
MDC IDC LEAD IMPLANT DT: 20161003
MDC IDC LEAD IMPLANT DT: 20161003
MDC IDC LEAD LOCATION: 753859
MDC IDC MSMT LEADCHNL RA IMPEDANCE VALUE: 412.5 Ohm
MDC IDC MSMT LEADCHNL RA PACING THRESHOLD AMPLITUDE: 0.5 V
MDC IDC MSMT LEADCHNL RA PACING THRESHOLD PULSEWIDTH: 0.4 ms
MDC IDC MSMT LEADCHNL RV IMPEDANCE VALUE: 412.5 Ohm
MDC IDC MSMT LEADCHNL RV PACING THRESHOLD AMPLITUDE: 0.5 V
MDC IDC MSMT LEADCHNL RV PACING THRESHOLD AMPLITUDE: 0.5 V
MDC IDC MSMT LEADCHNL RV PACING THRESHOLD PULSEWIDTH: 0.4 ms
MDC IDC PG IMPLANT DT: 20161003
MDC IDC SESS DTM: 20181120165836
MDC IDC SET LEADCHNL RV PACING AMPLITUDE: 2.5 V
MDC IDC SET LEADCHNL RV SENSING SENSITIVITY: 2 mV
MDC IDC STAT BRADY RV PERCENT PACED: 99.69 %
Pulse Gen Model: 2240
Pulse Gen Serial Number: 7818292

## 2017-04-25 MED ORDER — APIXABAN 5 MG PO TABS: 5.0000 mg | ORAL_TABLET | Freq: Two times a day (BID) | ORAL | 0 refills | Status: DC

## 2017-04-25 NOTE — Progress Notes (Signed)
Remote pacemaker transmission.   

## 2017-04-25 NOTE — Patient Instructions (Signed)
Medication Instructions:  Your physician recommends that you continue on your current medications as directed. Please refer to the Current Medication list given to you today.  *If you need a refill on your cardiac medications before your next appointment, please call your pharmacy*  Labwork: None ordered  Testing/Procedures: None ordered  Follow-Up: Remote monitoring is used to monitor your Pacemaker or ICD from home. This monitoring reduces the number of office visits required to check your device to one time per year. It allows [place] to keep an eye on the functioning of your device to ensure it is working properly. You are scheduled for a device check from home on 07/24/17. You may send your transmission at any time that day. If you have a wireless device, the transmission will be sent automatically. After your physician reviews your transmission, you will receive a postcard with your next transmission date.  Your physician wants you to follow-up in: 1 year with Dr. Curt Bears.  You will receive a reminder letter in the mail two months in advance. If you don't receive a letter, please call our office to schedule the follow-up appointment.  Thank you for choosing [facility]!!   Trinidad Curet, RN [phone]

## 2017-04-25 NOTE — Progress Notes (Signed)
Electrophysiology Office Note   Date:  04/25/2017   ID:  [name], DOB [birth year], MRN [id number]  PCP:  Lavone Orn, MD  Primary Electrophysiologist:  Laasia Arcos Meredith Leeds, MD    Chief Complaint  Patient presents with  . Pacemaker Check    AV block     History of Present Illness: [name] is a 83 y.o. male who presents today for electrophysiology evaluation.   He initially presented in September with fatugue and was found to be in 2:1 heart block.  He had a pacemaker placed in October 2016.  He has been doing well without major issues. Is tolerating his medications without problems. Was recently placed on Eliquis for atrial fibrillation and had no bleeding issues.  Today, denies symptoms of palpitations, chest pain, shortness of breath, orthopnea, PND, lower extremity edema, claudication, dizziness, presyncope, syncope, bleeding, or neurologic sequela. The patient is tolerating medications without difficulties.  He is currently getting over an upper respiratory illness.  He was put on a Z-Pak.  His cough is improved and he is feeling much better.  Otherwise with no complaints.     Past Medical History:  Diagnosis Date  . ED (erectile dysfunction)   . Hypertension   . Melanoma ([place])    left abdomen   Past Surgical History:  Procedure Laterality Date  . EP IMPLANTABLE DEVICE N/A 03/09/2015   Procedure: Pacemaker Implant;  Surgeon: Zyiah Withington Meredith Leeds, MD;  Location: [facility];  Service: Cardiovascular;  Laterality: N/A;  . NASAL SINUS SURGERY       Current Outpatient Medications  Medication Sig Dispense Refill  . amLODipine (NORVASC) 10 MG tablet Take 10 mg by mouth daily.  3  . cholecalciferol (VITAMIN D) 1000 UNITS tablet Take 1,000 Units by mouth daily.    Marland Kitchen ELIQUIS 5 MG TABS tablet TAKE ONE TABLET BY MOUTH TWICE DAILY 60 tablet 6  . lisinopril (PRINIVIL,ZESTRIL) 20 MG tablet Take 20 mg by mouth daily.  3   No current facility-administered  medications for this visit.     Allergies:   Avelox [moxifloxacin hcl in nacl] and Sulfa antibiotics   Social History:  The patient  reports that he has quit smoking. he has never used smokeless tobacco. He reports that he drinks alcohol. He reports that he does not use drugs.   Family History:  The patient's family history includes Heart failure in his mother; Hypertension in his father and mother.   ROS:  Please see the history of present illness.   Otherwise, review of systems is positive for sweats, chills, cough, easy bruising.   All other systems are reviewed and negative.   PHYSICAL EXAM: VS:  BP 130/82   Pulse 79   Ht 5\' 9"  (1.753 m)   Wt 169 lb (76.7 kg)   BMI 24.96 kg/m  , BMI Body mass index is 24.96 kg/m. GEN: Well nourished, well developed, in no acute distress  HEENT: normal  Neck: no JVD, carotid bruits, or masses Cardiac: RRR; no murmurs, rubs, or gallops,no edema  Respiratory:  clear to auscultation bilaterally, normal work of breathing GI: soft, nontender, nondistended, + BS MS: no deformity or atrophy  Skin: warm and dry, device site well healed Neuro:  Strength and sensation are intact Psych: euthymic mood, full affect  EKG:  EKG is ordered today. Personal review of the ekg ordered shows atrial sensed, ventricular paced  Personal review of the device interrogation today. Results in [place]: No  results found for requested labs within last 8760 hours.    Lipid Panel  No results found for: CHOL, TRIG, HDL, CHOLHDL, VLDL, LDLCALC, LDLDIRECT   Wt Readings from Last 3 Encounters:  04/25/17 169 lb (76.7 kg)  10/24/16 174 lb 9.6 oz (79.2 kg)  04/20/16 174 lb (78.9 kg)   ASSESSMENT AND PLAN:  1.  2:1 AV block: Status post Saint Jude dual-chamber pacemaker implanted 2016.  Lites functioning appropriately.  No changes at this time.  2. Hypertension: Controlled today.  No changes.  3. Paroxysmal atrial fibrillation: Currently on Eliquis  without any issues.  Continue with current management.  This patients CHA2DS2-VASc Score and unadjusted Ischemic Stroke Rate (% per year) is equal to 3.2 % stroke rate/year from a score of 3  Above score calculated as 1 point each if present [CHF, HTN, DM, Vascular=MI/PAD/Aortic Plaque, Age if 65-74, or Male] Above score calculated as 2 points each if present [Age > 75, or Stroke/TIA/TE]  Current medicines are reviewed at length with the patient today.   The patient does not have concerns regarding his medicines.  The following changes were made today:  none  Labs/ tests ordered today include:  Orders Placed This Encounter  Procedures  . EKG 12-Lead     Disposition:   FU with Jaelen Gellerman 12 months  Signed, Yatziry Deakins Meredith Leeds, MD  04/25/2017 4:18 PM     [place] [address] [phone] (office) [phone] (fax)

## 2017-04-25 NOTE — Addendum Note (Signed)
Addended by: Stanton Kidney on: 04/25/2017 04:35 PM   Modules accepted: Orders

## 2017-04-26 LAB — CUP PACEART REMOTE DEVICE CHECK
Battery Remaining Longevity: 107 mo
Battery Remaining Percentage: 95.5 %
Battery Voltage: 2.99 V
Brady Statistic RA Percent Paced: 67 %
Date Time Interrogation Session: 20181119075202
Implantable Lead Location: 753859
Implantable Lead Location: 753860
Lead Channel Impedance Value: 390 Ohm
Lead Channel Pacing Threshold Pulse Width: 0.4 ms
Lead Channel Sensing Intrinsic Amplitude: 5 mV
Lead Channel Sensing Intrinsic Amplitude: 6.8 mV
Lead Channel Setting Pacing Amplitude: 1.5 V
MDC IDC LEAD IMPLANT DT: 20161003
MDC IDC LEAD IMPLANT DT: 20161003
MDC IDC MSMT LEADCHNL RA PACING THRESHOLD AMPLITUDE: 0.5 V
MDC IDC MSMT LEADCHNL RV IMPEDANCE VALUE: 390 Ohm
MDC IDC MSMT LEADCHNL RV PACING THRESHOLD AMPLITUDE: 0.75 V
MDC IDC MSMT LEADCHNL RV PACING THRESHOLD PULSEWIDTH: 0.4 ms
MDC IDC PG IMPLANT DT: 20161003
MDC IDC SET LEADCHNL RV PACING AMPLITUDE: 2.5 V
MDC IDC SET LEADCHNL RV PACING PULSEWIDTH: 0.4 ms
MDC IDC SET LEADCHNL RV SENSING SENSITIVITY: 2 mV
MDC IDC STAT BRADY AP VP PERCENT: 67 %
MDC IDC STAT BRADY AP VS PERCENT: 1 %
MDC IDC STAT BRADY AS VP PERCENT: 32 %
MDC IDC STAT BRADY AS VS PERCENT: 1 %
MDC IDC STAT BRADY RV PERCENT PACED: 99 %
Pulse Gen Model: 2240
Pulse Gen Serial Number: 7818292

## 2017-05-02 ENCOUNTER — Other Ambulatory Visit: Payer: Self-pay | Admitting: Internal Medicine

## 2017-05-02 DIAGNOSIS — H532 Diplopia: Secondary | ICD-10-CM

## 2017-05-02 DIAGNOSIS — H40153 Residual stage of open-angle glaucoma, bilateral: Secondary | ICD-10-CM | POA: Diagnosis not present

## 2017-05-04 ENCOUNTER — Encounter: Payer: Self-pay | Admitting: Cardiology

## 2017-05-10 ENCOUNTER — Ambulatory Visit
Admission: RE | Admit: 2017-05-10 | Discharge: 2017-05-10 | Disposition: A | Discharge status: Home / Self Care | Payer: Medicare Other | Source: Ambulatory Visit | Attending: Internal Medicine | Admitting: Internal Medicine

## 2017-05-10 DIAGNOSIS — H532 Diplopia: Secondary | ICD-10-CM

## 2017-05-10 DIAGNOSIS — I6523 Occlusion and stenosis of bilateral carotid arteries: Secondary | ICD-10-CM | POA: Diagnosis not present

## 2017-05-12 DIAGNOSIS — L308 Other specified dermatitis: Secondary | ICD-10-CM | POA: Diagnosis not present

## 2017-05-12 DIAGNOSIS — L570 Actinic keratosis: Secondary | ICD-10-CM | POA: Diagnosis not present

## 2017-05-12 DIAGNOSIS — Z1283 Encounter for screening for malignant neoplasm of skin: Secondary | ICD-10-CM | POA: Diagnosis not present

## 2017-05-12 DIAGNOSIS — X32XXXD Exposure to sunlight, subsequent encounter: Secondary | ICD-10-CM | POA: Diagnosis not present

## 2017-07-11 DIAGNOSIS — K5900 Constipation, unspecified: Secondary | ICD-10-CM | POA: Diagnosis not present

## 2017-07-11 DIAGNOSIS — I480 Paroxysmal atrial fibrillation: Secondary | ICD-10-CM | POA: Diagnosis not present

## 2017-07-11 DIAGNOSIS — Z1389 Encounter for screening for other disorder: Secondary | ICD-10-CM | POA: Diagnosis not present

## 2017-07-11 DIAGNOSIS — I10 Essential (primary) hypertension: Secondary | ICD-10-CM | POA: Diagnosis not present

## 2017-07-24 ENCOUNTER — Ambulatory Visit (INDEPENDENT_AMBULATORY_CARE_PROVIDER_SITE_OTHER): Payer: Medicare Other | Admitting: *Deleted

## 2017-07-24 ENCOUNTER — Telehealth: Payer: Self-pay | Admitting: Cardiology

## 2017-07-24 DIAGNOSIS — I4430 Unspecified atrioventricular block: Secondary | ICD-10-CM | POA: Diagnosis not present

## 2017-07-24 NOTE — Telephone Encounter (Signed)
Spoke with pt and reminded pt of remote transmission that is due today. Pt verbalized understanding.   

## 2017-07-25 NOTE — Progress Notes (Signed)
Remote pacemaker transmission.   

## 2017-07-26 LAB — CUP PACEART REMOTE DEVICE CHECK
Battery Remaining Longevity: 107 mo
Battery Remaining Percentage: 95.5 %
Battery Voltage: 2.99 V
Brady Statistic AP VS Percent: 1 %
Brady Statistic AS VS Percent: 1 %
Date Time Interrogation Session: 20190218212322
Implantable Lead Implant Date: 20161003
Implantable Lead Location: 753860
Lead Channel Impedance Value: 410 Ohm
Lead Channel Pacing Threshold Amplitude: 0.5 V
Lead Channel Pacing Threshold Amplitude: 0.5 V
Lead Channel Pacing Threshold Pulse Width: 0.4 ms
Lead Channel Pacing Threshold Pulse Width: 0.4 ms
Lead Channel Sensing Intrinsic Amplitude: 5 mV
Lead Channel Setting Pacing Pulse Width: 0.4 ms
MDC IDC LEAD IMPLANT DT: 20161003
MDC IDC LEAD LOCATION: 753859
MDC IDC MSMT LEADCHNL RV IMPEDANCE VALUE: 390 Ohm
MDC IDC MSMT LEADCHNL RV SENSING INTR AMPL: 7.6 mV
MDC IDC PG IMPLANT DT: 20161003
MDC IDC SET LEADCHNL RA PACING AMPLITUDE: 1.5 V
MDC IDC SET LEADCHNL RV PACING AMPLITUDE: 2.5 V
MDC IDC SET LEADCHNL RV SENSING SENSITIVITY: 2 mV
MDC IDC STAT BRADY AP VP PERCENT: 67 %
MDC IDC STAT BRADY AS VP PERCENT: 33 %
MDC IDC STAT BRADY RA PERCENT PACED: 67 %
MDC IDC STAT BRADY RV PERCENT PACED: 99 %
Pulse Gen Model: 2240
Pulse Gen Serial Number: 7818292

## 2017-07-27 ENCOUNTER — Encounter: Payer: Self-pay | Admitting: Cardiology

## 2017-09-05 ENCOUNTER — Other Ambulatory Visit: Payer: Self-pay | Admitting: Cardiology

## 2017-09-05 DIAGNOSIS — I441 Atrioventricular block, second degree: Secondary | ICD-10-CM

## 2017-09-05 NOTE — Telephone Encounter (Signed)
07/11/17 - SCr 1.1 at [facility].

## 2017-09-06 DIAGNOSIS — C004 Malignant neoplasm of lower lip, inner aspect: Secondary | ICD-10-CM | POA: Diagnosis not present

## 2017-09-27 DIAGNOSIS — C001 Malignant neoplasm of external lower lip: Secondary | ICD-10-CM | POA: Diagnosis not present

## 2017-09-28 DIAGNOSIS — C001 Malignant neoplasm of external lower lip: Secondary | ICD-10-CM | POA: Diagnosis not present

## 2017-10-18 DIAGNOSIS — L0390 Cellulitis, unspecified: Secondary | ICD-10-CM | POA: Diagnosis not present

## 2017-10-18 DIAGNOSIS — S81802A Unspecified open wound, left lower leg, initial encounter: Secondary | ICD-10-CM | POA: Diagnosis not present

## 2017-10-23 ENCOUNTER — Telehealth: Payer: Self-pay | Admitting: Cardiology

## 2017-10-23 ENCOUNTER — Ambulatory Visit (INDEPENDENT_AMBULATORY_CARE_PROVIDER_SITE_OTHER): Payer: Medicare Other | Admitting: *Deleted

## 2017-10-23 DIAGNOSIS — L0390 Cellulitis, unspecified: Secondary | ICD-10-CM | POA: Diagnosis not present

## 2017-10-23 DIAGNOSIS — I4430 Unspecified atrioventricular block: Secondary | ICD-10-CM

## 2017-10-23 DIAGNOSIS — L03116 Cellulitis of left lower limb: Secondary | ICD-10-CM | POA: Diagnosis not present

## 2017-10-23 NOTE — Telephone Encounter (Signed)
Spoke with pt and reminded pt of remote transmission that is due today. Pt verbalized understanding.   

## 2017-10-24 ENCOUNTER — Encounter: Payer: Self-pay | Admitting: Cardiology

## 2017-10-24 LAB — CUP PACEART REMOTE DEVICE CHECK
Battery Remaining Percentage: 95.5 %
Battery Voltage: 2.99 V
Brady Statistic AS VS Percent: 1 %
Implantable Lead Implant Date: 20161003
Implantable Pulse Generator Implant Date: 20161003
Lead Channel Pacing Threshold Amplitude: 0.5 V
Lead Channel Pacing Threshold Pulse Width: 0.4 ms
Lead Channel Sensing Intrinsic Amplitude: 5 mV
Lead Channel Setting Pacing Amplitude: 2.5 V
Lead Channel Setting Sensing Sensitivity: 2 mV
MDC IDC LEAD IMPLANT DT: 20161003
MDC IDC LEAD LOCATION: 753859
MDC IDC LEAD LOCATION: 753860
MDC IDC MSMT BATTERY REMAINING LONGEVITY: 108 mo
MDC IDC MSMT LEADCHNL RA IMPEDANCE VALUE: 410 Ohm
MDC IDC MSMT LEADCHNL RA PACING THRESHOLD PULSEWIDTH: 0.4 ms
MDC IDC MSMT LEADCHNL RV IMPEDANCE VALUE: 400 Ohm
MDC IDC MSMT LEADCHNL RV PACING THRESHOLD AMPLITUDE: 0.5 V
MDC IDC MSMT LEADCHNL RV SENSING INTR AMPL: 6.8 mV
MDC IDC PG SERIAL: 7818292
MDC IDC SESS DTM: 20190521133256
MDC IDC SET LEADCHNL RA PACING AMPLITUDE: 1.5 V
MDC IDC SET LEADCHNL RV PACING PULSEWIDTH: 0.4 ms
MDC IDC STAT BRADY AP VP PERCENT: 67 %
MDC IDC STAT BRADY AP VS PERCENT: 1 %
MDC IDC STAT BRADY AS VP PERCENT: 33 %
MDC IDC STAT BRADY RA PERCENT PACED: 67 %
MDC IDC STAT BRADY RV PERCENT PACED: 99 %

## 2017-10-24 NOTE — Progress Notes (Signed)
Remote pacemaker transmission.   

## 2017-10-26 ENCOUNTER — Encounter: Payer: Medicare Other | Attending: Physician Assistant | Admitting: Nurse Practitioner

## 2017-10-26 DIAGNOSIS — I10 Essential (primary) hypertension: Secondary | ICD-10-CM | POA: Insufficient documentation

## 2017-10-26 DIAGNOSIS — L97222 Non-pressure chronic ulcer of left calf with fat layer exposed: Secondary | ICD-10-CM | POA: Insufficient documentation

## 2017-10-26 DIAGNOSIS — Z85820 Personal history of malignant melanoma of skin: Secondary | ICD-10-CM | POA: Diagnosis not present

## 2017-10-26 DIAGNOSIS — I4891 Unspecified atrial fibrillation: Secondary | ICD-10-CM | POA: Diagnosis not present

## 2017-10-26 DIAGNOSIS — Z882 Allergy status to sulfonamides status: Secondary | ICD-10-CM | POA: Insufficient documentation

## 2017-10-26 DIAGNOSIS — S81802A Unspecified open wound, left lower leg, initial encounter: Secondary | ICD-10-CM | POA: Diagnosis not present

## 2017-10-26 DIAGNOSIS — Z87891 Personal history of nicotine dependence: Secondary | ICD-10-CM | POA: Insufficient documentation

## 2017-10-28 NOTE — Progress Notes (Signed)
QUOC, TOME ([id number]) Visit Report for 10/26/2017 Abuse/Suicide Risk Screen Details Patient Name: [name], [name] Date of Service: 10/26/2017 2:30 PM Medical Record Number: [id number] Patient Account Number: [id number] Date of Birth/Sex: 1929/03/07 (84 y.o. Male) Treating RN: Montey Hora Primary Care Denisia Harpole: Lavone Orn Other Clinician: Referring Tanay Misuraca: Wenda Low Treating Braylei Totino/Extender: Cathie Olden in Treatment: 0 Abuse/Suicide Risk Screen Items Answer ABUSE/SUICIDE RISK SCREEN: Has anyone close to you tried to hurt or harm you recentlyo No Do you feel uncomfortable with anyone in your familyo No Has anyone forced you do things that you didnot want to doo No Do you have any thoughts of harming yourselfo No Patient displays signs or symptoms of abuse and/or neglect. No Electronic Signature(s) Signed: 10/26/2017 5:31:29 PM By: Montey Hora Entered By: Montey Hora on 10/26/2017 14:46:36 [name] ([id number]) -------------------------------------------------------------------------------- Activities of Daily Living Details Patient Name: [name] Date of Service: 10/26/2017 2:30 PM Medical Record Number: [id number] Patient Account Number: [id number] Date of Birth/Sex: Jun 07, 1928 (84 y.o. Male) Treating RN: Montey Hora Primary Care Keath Matera: Lavone Orn Other Clinician: Referring Jackelyne Sayer: Wenda Low Treating Rahiem Schellinger/Extender: Cathie Olden in Treatment: 0 Activities of Daily Living Items Answer Activities of Daily Living (Please select one for each item) Drive Automobile Completely Able Take Medications Completely Able Use Telephone Completely [place] for Appearance Completely Able Use Toilet Completely Able Bath / Shower Completely Able Dress Self Completely Able Feed Self Completely Able Walk Completely Able Get In / Out Bed Completely Able Housework Completely Able Prepare Meals Completely Loyal for Self Completely Able Electronic Signature(s) Signed: 10/26/2017 5:31:29 PM By: Montey Hora Entered By: Montey Hora on 10/26/2017 14:47:08 [name] ([id number]) -------------------------------------------------------------------------------- Education Assessment Details Patient Name: [name] Date of Service: 10/26/2017 2:30 PM Medical Record Number: [id number] Patient Account Number: [id number] Date of Birth/Sex: 11/21/1928 (84 y.o. Male) Treating RN: Montey Hora Primary Care Sidonie Dexheimer: Lavone Orn Other Clinician: Referring Aarica Wax: Wenda Low Treating Gurkaran Rahm/Extender: Cathie Olden in Treatment: 0 Primary Learner Assessed: Patient Learning Preferences/Education Level/Primary Language Learning Preference: Explanation, Demonstration Highest Education Level: High School Preferred Language: English Cognitive Barrier Assessment/Beliefs Language Barrier: No Translator Needed: No Memory Deficit: No Emotional Barrier: No Cultural/Religious Beliefs Affecting Medical Care: No Physical Barrier Assessment Impaired Vision: No Impaired Hearing: No Decreased Hand dexterity: No Knowledge/Comprehension Assessment Knowledge Level: Medium Comprehension Level: Medium Ability to understand written Medium instructions: Ability to understand verbal Medium instructions: Motivation Assessment Anxiety Level: Calm Cooperation: Cooperative Education Importance: Acknowledges Need Interest in Health Problems: Asks Questions Perception: Coherent Willingness to Engage in Self- Medium Management Activities: Readiness to Engage in Self- Medium Management Activities: Electronic Signature(s) Signed: 10/26/2017 5:31:29 PM By: Montey Hora Entered By: Montey Hora on 10/26/2017 14:47:34 [name] ([id number]) -------------------------------------------------------------------------------- Fall Risk Assessment Details Patient Name:  [name] Date of Service: 10/26/2017 2:30 PM Medical Record Number: [id number] Patient Account Number: [id number] Date of Birth/Sex: [birth year] (84 y.o. Male) Treating RN: Montey Hora Primary Care Karilyn Wind: Lavone Orn Other Clinician: Referring Shanan Fitzpatrick: Wenda Low Treating Reniah Cottingham/Extender: Cathie Olden in Treatment: 0 Fall Risk Assessment Items Have you had 2 or more falls in the last 12 monthso 0 No Have you had any fall that resulted in injury in the last 12 monthso 0 No FALL RISK ASSESSMENT: History of falling - immediate or within 3 months 0 No Secondary diagnosis 0 No Ambulatory aid None/bed rest/wheelchair/nurse 0 Yes Crutches/cane/walker 0 No Furniture 0 No IV Access/Saline Lock 0 No Gait/Training Normal/bed rest/immobile 0 No Weak  10 Yes Impaired 0 No Mental Status Oriented to own ability 0 Yes Electronic Signature(s) Signed: 10/26/2017 5:31:29 PM By: Montey Hora Entered By: Montey Hora on 10/26/2017 14:47:44 [name] ([id number]) -------------------------------------------------------------------------------- Foot Assessment Details Patient Name: [name] Date of Service: 10/26/2017 2:30 PM Medical Record Number: [id number] Patient Account Number: [id number] Date of Birth/Sex: April 02, 1929 (84 y.o. Male) Treating RN: Montey Hora Primary Care Deke Tilghman: Lavone Orn Other Clinician: Referring Karmina Zufall: Wenda Low Treating Lamia Mariner/Extender: Cathie Olden in Treatment: 0 Foot Assessment Items Site Locations + = Sensation present, - = Sensation absent, C = Callus, U = Ulcer R = Redness, W = Warmth, M = Maceration, PU = Pre-ulcerative lesion F = Fissure, S = Swelling, D = Dryness Assessment Right: Left: Other Deformity: No No Prior Foot Ulcer: No No Prior Amputation: No No Charcot Joint: No No Ambulatory Status: Ambulatory Without Help Gait: Steady Electronic Signature(s) Signed: 10/26/2017 5:31:29 PM By:  Montey Hora Entered By: Montey Hora on 10/26/2017 14:48:16 [name] ([id number]) -------------------------------------------------------------------------------- Nutrition Risk Assessment Details Patient Name: [name] Date of Service: 10/26/2017 2:30 PM Medical Record Number: [id number] Patient Account Number: [id number] Date of Birth/Sex: 09/21/1928 (84 y.o. Male) Treating RN: Montey Hora Primary Care Bradshaw Minihan: Lavone Orn Other Clinician: Referring Jowanda Heeg: Wenda Low Treating Lesleigh Hughson/Extender: Cathie Olden in Treatment: 0 Height (in): 69 Weight (lbs): 170 Body Mass Index (BMI): 25.1 Nutrition Risk Assessment Items NUTRITION RISK SCREEN: I have an illness or condition that made me change the kind and/or amount of 0 No food I eat I eat fewer than two meals per day 0 No I eat few fruits and vegetables, or milk products 0 No I have three or more drinks of beer, liquor or wine almost every day 0 No I have tooth or mouth problems that make it hard for me to eat 0 No I don't always have enough money to buy the food I need 0 No I eat alone most of the time 0 No I take three or more different prescribed or over-the-counter drugs a day 1 Yes Without wanting to, I have lost or gained 10 pounds in the last six months 0 No I am not always physically able to shop, cook and/or feed myself 0 No Nutrition Protocols Good Risk Protocol Moderate Risk Protocol Electronic Signature(s) Signed: 10/26/2017 5:31:29 PM By: Montey Hora Entered By: Montey Hora on 10/26/2017 14:47:50

## 2017-10-28 NOTE — Progress Notes (Signed)
SEBASTIANO, LUECKE ([id number]) Visit Report for 10/26/2017 Chief Complaint Document Details Patient Name: [name], [name] Date of Service: 10/26/2017 2:30 PM Medical Record Number: [id number] Patient Account Number: [id number] Date of Birth/Sex: Mar 28, 1929 (84 y.o. Male) Treating RN: Roger Shelter Primary Care Provider: Lavone Orn Other Clinician: Referring Provider: Wenda Low Treating Provider/Extender: Cathie Olden in Treatment: 0 Information Obtained from: Patient Chief Complaint He is here for left lower extremity wound Electronic Signature(s) Signed: 10/26/2017 3:37:31 PM By: Lawanda Cousins Entered By: Lawanda Cousins on 10/26/2017 15:37:31 [name] ([id number]) -------------------------------------------------------------------------------- Debridement Details Patient Name: [name] Date of Service: 10/26/2017 2:30 PM Medical Record Number: [id number] Patient Account Number: [id number] Date of Birth/Sex: 1929-04-05 (84 y.o. Male) Treating RN: Roger Shelter Primary Care Provider: Lavone Orn Other Clinician: Referring Provider: Wenda Low Treating Provider/Extender: Cathie Olden in Treatment: 0 Debridement Performed for Wound #1 Left,Anterior Lower Leg Assessment: Performed By: Physician Lawanda Cousins, NP Debridement Type: Debridement Pre-procedure Verification/Time Yes - 15:17 Out Taken: Start Time: 15:17 Pain Control: Other : lidocaine 4% Total Area Debrided (L x W): 3.9 (cm) x 2.8 (cm) = 10.92 (cm) Tissue and other material Viable, Non-Viable, Slough, Subcutaneous, Biofilm, Slough debrided: Level: Skin/Subcutaneous Tissue Debridement Description: Excisional Instrument: Curette Bleeding: Minimum Hemostasis Achieved: Pressure End Time: 15:18 Procedural Pain: 0 Post Procedural Pain: 0 Response to Treatment: Procedure was tolerated well Level of Consciousness: Awake and Alert Post Procedure Vitals: Temperature: 97.6 Pulse:  68 Respiratory Rate: 18 Blood Pressure: Systolic Blood Pressure: 630 Diastolic Blood Pressure: 76 Post Debridement Measurements of Total Wound Length: (cm) 3.9 Width: (cm) 2.8 Depth: (cm) 0.2 Volume: (cm) 1.715 Character of Wound/Ulcer Post Debridement: Stable Post Procedure Diagnosis Same as Pre-procedure Electronic Signature(s) Signed: 10/26/2017 3:37:06 PM By: Lawanda Cousins Signed: 10/26/2017 4:33:19 PM By: Roger Shelter Entered By: Lawanda Cousins on 10/26/2017 15:37:06 [name] ([id number]) -------------------------------------------------------------------------------- HPI Details Patient Name: [name] Date of Service: 10/26/2017 2:30 PM Medical Record Number: [id number] Patient Account Number: [id number] Date of Birth/Sex: April 22, 1929 (84 y.o. Male) Treating RN: Roger Shelter Primary Care Provider: Lavone Orn Other Clinician: Referring Provider: Wenda Low Treating Provider/Extender: Cathie Olden in Treatment: 0 History of Present Illness HPI Description: 10/26/17-He is here for an initial evaluation for left lower extremity traumatic injury. He states that on approximate 3 weeks ago he injured his leg with a step stool while cleaning his car. He saw Dr. Deforest Hoyles at [facility] on 5/15 who prescribed doxycycline; he had a follow-up on 5/20 when he was referred to the wound clinic and instructed to continue doxycycline and Neosporin. He denies pain, he denies edema. He is nondiabetic and a former smoker, quit in 1968. He does wear compression stockings/wraps. Electronic Signature(s) Signed: 10/26/2017 3:50:41 PM By: Lawanda Cousins Entered By: Lawanda Cousins on 10/26/2017 15:50:41 [name] ([id number]) -------------------------------------------------------------------------------- Physician Orders Details Patient Name: [name] Date of Service: 10/26/2017 2:30 PM Medical Record Number: [id number] Patient Account Number:  [id number] Date of Birth/Sex: [birth year] (84 y.o. Male) Treating RN: Roger Shelter Primary Care Provider: Lavone Orn Other Clinician: Referring Provider: Wenda Low Treating Provider/Extender: Cathie Olden in Treatment: 0 Verbal / Phone Orders: No Diagnosis Coding Wound Cleansing Wound #1 Left,Anterior Lower Leg o Clean wound with Normal Saline. Anesthetic (add to Medication List) Wound #1 Left,Anterior Lower Leg o Topical Lidocaine 4% cream applied to wound bed prior to debridement (In Clinic Only). Primary Wound Dressing Wound #1 Left,Anterior Lower Leg o Iodoflex Secondary Dressing Wound #1 Left,Anterior Lower Leg o Dry Gauze o Conform/Kerlix Dressing Change  Frequency Wound #1 Left,Anterior Lower Leg o Dressing is to be changed Monday and Thursday. Follow-up Appointments Wound #1 Left,Anterior Lower Leg o Return Appointment in 1 week. Edema Control o Other: - ace wrap Electronic Signature(s) Signed: 10/26/2017 4:33:19 PM By: Roger Shelter Signed: 10/27/2017 8:17:02 AM By: Lawanda Cousins Entered By: Roger Shelter on 10/26/2017 15:29:04 [name] ([id number]) -------------------------------------------------------------------------------- Problem List Details Patient Name: [name] Date of Service: 10/26/2017 2:30 PM Medical Record Number: [id number] Patient Account Number: [id number] Date of Birth/Sex: [birth year] (84 y.o. Male) Treating RN: Roger Shelter Primary Care Provider: Lavone Orn Other Clinician: Referring Provider: Wenda Low Treating Provider/Extender: Cathie Olden in Treatment: 0 Active Problems ICD-10 Impacting Encounter Code Description Active Date Wound Healing Diagnosis L97.222 Non-pressure chronic ulcer of left calf with fat layer exposed 10/26/2017 Yes S81.812S Laceration without foreign body, left lower leg, sequela 10/26/2017 Yes Inactive Problems Resolved Problems Electronic  Signature(s) Signed: 10/26/2017 3:36:35 PM By: Lawanda Cousins Entered By: Lawanda Cousins on 10/26/2017 15:36:35 [name] ([id number]) -------------------------------------------------------------------------------- Progress Note Details Patient Name: [name] Date of Service: 10/26/2017 2:30 PM Medical Record Number: [id number] Patient Account Number: [id number] Date of Birth/Sex: Sep 12, 1928 (84 y.o. Male) Treating RN: Roger Shelter Primary Care Provider: Lavone Orn Other Clinician: Referring Provider: Wenda Low Treating Provider/Extender: Cathie Olden in Treatment: 0 Subjective Chief Complaint Information obtained from Patient He is here for left lower extremity wound History of Present Illness (HPI) 10/26/17-He is here for an initial evaluation for left lower extremity traumatic injury. He states that on approximate 3 weeks ago he injured his leg with a step stool while cleaning his car. He saw Dr. Deforest Hoyles at [facility] on 5/15 who prescribed doxycycline; he had a follow-up on 5/20 when he was referred to the wound clinic and instructed to continue doxycycline and Neosporin. He denies pain, he denies edema. He is nondiabetic and a former smoker, quit in 1968. He does wear compression stockings/wraps. Wound History Patient presents with 1 open wound that has been present for approximately 3 weeks. Patient has been treating wound in the following manner: bandage. Laboratory tests have not been performed in the last month. Patient reportedly has tested positive for an antibiotic resistant organism. Patient reportedly has not tested positive for osteomyelitis. Patient reportedly has not had testing performed to evaluate circulation in the legs. Patient experiences the following problems associated with their wounds: infection. Patient History Information obtained from Patient. Allergies Septra Family History Heart Disease - Father,Mother, Hypertension -  Mother,Father, No family history of Cancer, Hereditary Spherocytosis, Kidney Disease, Lung Disease, Seizures, Stroke, Thyroid Problems, Tuberculosis. Social History Former smoker - quit 50 years ago, Alcohol Use - Never, Drug Use - No History, Caffeine Use - Daily. Medical History Eyes Denies history of Cataracts, Glaucoma, Optic Neuritis Ear/Nose/Mouth/Throat Denies history of Chronic sinus problems/congestion, Middle ear problems Hematologic/Lymphatic Denies history of Anemia, Hemophilia, Human Immunodeficiency Virus, Lymphedema, Sickle Cell Disease Respiratory Denies history of Aspiration, Asthma, Chronic Obstructive Pulmonary Disease (COPD), Pneumothorax, Sleep Apnea, Tuberculosis Cardiovascular Patient has history of Arrhythmia - a fib, Hypertension [name], [name] ([id number]) Denies history of Angina, Congestive Heart Failure, Coronary Artery Disease, Deep Vein Thrombosis, Hypotension, Myocardial Infarction, Peripheral Arterial Disease, Peripheral Venous Disease, Phlebitis, Vasculitis Gastrointestinal Denies history of Cirrhosis , Colitis, Crohn s, Hepatitis A, Hepatitis B, Hepatitis C Endocrine Denies history of Type I Diabetes, Type II Diabetes Genitourinary Denies history of End Stage Renal Disease Immunological Denies history of Lupus Erythematosus, Raynaud s, Scleroderma Integumentary (Skin) Denies history of History of Burn,  History of pressure wounds Musculoskeletal Denies history of Gout, Rheumatoid Arthritis, Osteoarthritis, Osteomyelitis Neurologic Denies history of Dementia, Neuropathy, Quadriplegia, Paraplegia, Seizure Disorder Oncologic Denies history of Received Chemotherapy, Received Radiation Psychiatric Denies history of Anorexia/bulimia, Confinement Anxiety Medical And Surgical History Notes Oncologic multiple melanomas Review of Systems (ROS) Constitutional Symptoms (General Health) The patient has no complaints or symptoms. Eyes Denies complaints  or symptoms of Dry Eyes, Vision Changes, Glasses / Contacts. Ear/Nose/Mouth/Throat The patient has no complaints or symptoms. Hematologic/Lymphatic Denies complaints or symptoms of Bleeding / Clotting Disorders, Human Immunodeficiency Virus. Respiratory Denies complaints or symptoms of Chronic or frequent coughs, Shortness of Breath. Cardiovascular Complains or has symptoms of LE edema. Denies complaints or symptoms of Chest pain. Gastrointestinal Denies complaints or symptoms of Frequent diarrhea, Nausea, Vomiting. Endocrine Denies complaints or symptoms of Hepatitis, Thyroid disease, Polydypsia (Excessive Thirst). Genitourinary Denies complaints or symptoms of Kidney failure/ Dialysis, Incontinence/dribbling. Immunological Denies complaints or symptoms of Hives, Itching. Integumentary (Skin) Complains or has symptoms of Wounds. Denies complaints or symptoms of Bleeding or bruising tendency, Breakdown, Swelling. Musculoskeletal Complains or has symptoms of Muscle Weakness. Denies complaints or symptoms of Muscle Pain. Neurologic Denies complaints or symptoms of Numbness/parasthesias, Focal/Weakness. Psychiatric Denies complaints or symptoms of Anxiety, Claustrophobia. [name], [name] ([id number]) Objective Constitutional Vitals Time Taken: 2:44 PM, Height: 69 in, Source: Measured, Weight: 170 lbs, Source: Measured, BMI: 25.1, Temperature: 97.6 F, Pulse: 68 bpm, Respiratory Rate: 18 breaths/min, Blood Pressure: 146/76 mmHg. Integumentary (Hair, Skin) Wound #1 status is Open. Original cause of wound was Trauma. The wound is located on the Left,Anterior Lower Leg. The wound measures 3.9cm length x 2.8cm width x 0.2cm depth; 8.577cm^2 area and 1.715cm^3 volume. There is Fat Layer (Subcutaneous Tissue) Exposed exposed. There is no tunneling or undermining noted. There is a large amount of serous drainage noted. The wound margin is flat and intact. There is no granulation within the  wound bed. There is a large (67-100%) amount of necrotic tissue within the wound bed including Eschar and Adherent Slough. The periwound skin appearance exhibited: Maceration, Erythema. The periwound skin appearance did not exhibit: Callus, Crepitus, Excoriation, Induration, Rash, Scarring, Dry/Scaly, Atrophie Blanche, Cyanosis, Ecchymosis, Hemosiderin Staining, Mottled, Pallor, Rubor. The surrounding wound skin color is noted with erythema which is circumferential. Periwound temperature was noted as No Abnormality. Assessment Active Problems ICD-10 L97.222 - Non-pressure chronic ulcer of left calf with fat layer exposed S81.812S - Laceration without foreign body, left lower leg, sequela Procedures Wound #1 Pre-procedure diagnosis of Wound #1 is a Trauma, Other located on the Left,Anterior Lower Leg . There was a Excisional Skin/Subcutaneous Tissue Debridement with a total area of 10.92 sq cm performed by Lawanda Cousins, NP. With the following instrument(s): Curette to remove Viable and Non-Viable tissue/material. Material removed includes Subcutaneous Tissue, Slough, and Biofilm after achieving pain control using Other (lidocaine 4%). No specimens were taken. A time out was conducted at 15:17, prior to the start of the procedure. A Minimum amount of bleeding was controlled with Pressure. The procedure was tolerated well with a pain level of 0 throughout and a pain level of 0 following the procedure. Patient s Level of Consciousness post procedure was recorded as Awake and Alert and post-procedure vitals were taken including Temperature: 97.6 F, Pulse: 68 bpm, Respiratory Rate: 18 breaths/min, Blood Pressure: (146)/(76) mmHg. Post Debridement Measurements: 3.9cm length x 2.8cm width x 0.2cm depth; 1.715cm^3 volume. Character of Wound/Ulcer Post Debridement is stable. Post procedure Diagnosis Wound #1: Same as Pre-Procedure [name], [name] ([id number])  Plan Wound Cleansing: Wound #1  Left,Anterior Lower Leg: Clean wound with Normal Saline. Anesthetic (add to Medication List): Wound #1 Left,Anterior Lower Leg: Topical Lidocaine 4% cream applied to wound bed prior to debridement (In Clinic Only). Primary Wound Dressing: Wound #1 Left,Anterior Lower Leg: Iodoflex Secondary Dressing: Wound #1 Left,Anterior Lower Leg: Dry Gauze Conform/Kerlix Dressing Change Frequency: Wound #1 Left,Anterior Lower Leg: Dressing is to be changed Monday and Thursday. Follow-up Appointments: Wound #1 Left,Anterior Lower Leg: Return Appointment in 1 week. Edema Control: Other: - ace wrap Electronic Signature(s) Signed: 10/26/2017 3:51:12 PM By: Lawanda Cousins Entered By: Lawanda Cousins on 10/26/2017 15:51:12 [name] ([id number]) -------------------------------------------------------------------------------- ROS/PFSH Details Patient Name: [name] Date of Service: 10/26/2017 2:30 PM Medical Record Number: [id number] Patient Account Number: [id number] Date of Birth/Sex: Jul 23, 1928 (84 y.o. Male) Treating RN: Montey Hora Primary Care Provider: Lavone Orn Other Clinician: Referring Provider: Wenda Low Treating Provider/Extender: Cathie Olden in Treatment: 0 Information Obtained From Patient Wound History Do you currently have one or more open woundso Yes How many open wounds do you currently haveo 1 Approximately how long have you had your woundso 3 weeks How have you been treating your wound(s) until nowo bandage Has your wound(s) ever healed and then re-openedo No Have you had any lab work done in the past montho No Have you tested positive for an antibiotic resistant organism (MRSA, VRE)o Yes Date: 10/19/2017 Have you tested positive for osteomyelitis (bone infection)o No Have you had any tests for circulation on your legso No Have you had other problems associated with your woundso Infection Constitutional Symptoms (General Health) Complaints and  Symptoms: No Complaints or Symptoms Complaints and Symptoms: Negative for: Fatigue; Fever; Chills; Marked Weight Change Eyes Complaints and Symptoms: Negative for: Dry Eyes; Vision Changes; Glasses / Contacts Medical History: Negative for: Cataracts; Glaucoma; Optic Neuritis Ear/Nose/Mouth/Throat Complaints and Symptoms: No Complaints or Symptoms Complaints and Symptoms: Negative for: Difficult clearing ears; Sinusitis Medical History: Negative for: Chronic sinus problems/congestion; Middle ear problems Hematologic/Lymphatic Complaints and Symptoms: Negative for: Bleeding / Clotting Disorders; Human Immunodeficiency Virus Medical HistoryTIMBER, [name] ([id number]) Negative for: Anemia; Hemophilia; Human Immunodeficiency Virus; Lymphedema; Sickle Cell Disease Respiratory Complaints and Symptoms: Negative for: Chronic or frequent coughs; Shortness of Breath Medical History: Negative for: Aspiration; Asthma; Chronic Obstructive Pulmonary Disease (COPD); Pneumothorax; Sleep Apnea; Tuberculosis Cardiovascular Complaints and Symptoms: Positive for: LE edema Negative for: Chest pain Medical History: Positive for: Arrhythmia - a fib; Hypertension Negative for: Angina; Congestive Heart Failure; Coronary Artery Disease; Deep Vein Thrombosis; Hypotension; Myocardial Infarction; Peripheral Arterial Disease; Peripheral Venous Disease; Phlebitis; Vasculitis Gastrointestinal Complaints and Symptoms: Negative for: Frequent diarrhea; Nausea; Vomiting Medical History: Negative for: Cirrhosis ; Colitis; Crohnos; Hepatitis A; Hepatitis B; Hepatitis C Endocrine Complaints and Symptoms: Negative for: Hepatitis; Thyroid disease; Polydypsia (Excessive Thirst) Medical History: Negative for: Type I Diabetes; Type II Diabetes Genitourinary Complaints and Symptoms: Negative for: Kidney failure/ Dialysis; Incontinence/dribbling Medical History: Negative for: End Stage Renal  Disease Immunological Complaints and Symptoms: Negative for: Hives; Itching Medical History: Negative for: Lupus Erythematosus; Raynaudos; Scleroderma Integumentary (Skin) Complaints and Symptoms: Positive for: Wounds Negative for: Bleeding or bruising tendency; Breakdown; Swelling [name], [name] ([id number]) Medical History: Negative for: History of Burn; History of pressure wounds Musculoskeletal Complaints and Symptoms: Positive for: Muscle Weakness Negative for: Muscle Pain Medical History: Negative for: Gout; Rheumatoid Arthritis; Osteoarthritis; Osteomyelitis Neurologic Complaints and Symptoms: Negative for: Numbness/parasthesias; Focal/Weakness Medical History: Negative for: Dementia; Neuropathy; Quadriplegia; Paraplegia; Seizure Disorder Psychiatric Complaints and Symptoms: Negative for: Anxiety;  Claustrophobia Medical History: Negative for: Anorexia/bulimia; Confinement Anxiety Oncologic Medical History: Negative for: Received Chemotherapy; Received Radiation Past Medical History Notes: multiple melanomas Immunizations Pneumococcal Vaccine: Received Pneumococcal Vaccination: Yes Implantable Devices Family and Social History Cancer: No; Heart Disease: Yes - Father,Mother; Hereditary Spherocytosis: No; Hypertension: Yes - Mother,Father; Kidney Disease: No; Lung Disease: No; Seizures: No; Stroke: No; Thyroid Problems: No; Tuberculosis: No; Former smoker - quit 50 years ago; Alcohol Use: Never; Drug Use: No History; Caffeine Use: Daily; Financial Concerns: No; Food, Clothing or Shelter Needs: No; Support System Lacking: No; Transportation Concerns: No; Advanced Directives: No; Patient does not want information on Advanced Directives Electronic Signature(s) Signed: 10/26/2017 5:31:29 PM By: Montey Hora Signed: 10/27/2017 8:17:02 AM By: Lawanda Cousins Entered By: Montey Hora on 10/26/2017 14:55:38 [name]  ([id number]) -------------------------------------------------------------------------------- [place] Details Patient Name: [name] Date of Service: 10/26/2017 Medical Record Number: [id number] Patient Account Number: [id number] Date of Birth/Sex: 1928-11-28 (84 y.o. Male) Treating RN: Roger Shelter Primary Care Provider: Lavone Orn Other Clinician: Referring Provider: Wenda Low Treating Provider/Extender: Cathie Olden in Treatment: 0 Diagnosis Coding ICD-10 Codes Code Description [phone] Non-pressure chronic ulcer of left calf with fat layer exposed S81.812S Laceration without foreign body, left lower leg, sequela Facility Procedures CPT4 Code: [id number] Description: 20355 - DEB SUBQ TISSUE 20 SQ CM/< ICD-10 Diagnosis Description L97.222 Non-pressure chronic ulcer of left calf with fat layer expo S81.812S Laceration without foreign body, left lower leg, sequela Modifier: sed Quantity: 1 Physician Procedures CPT4 Code: [id number] Description: WC PHYS LEVEL 3 o NEW PT ICD-10 Diagnosis Description L97.222 Non-pressure chronic ulcer of left calf with fat layer expo S81.812S Laceration without foreign body, left lower leg, sequela Modifier: sed Quantity: 1 CPT4 Code: [id number] Description: 11042 - WC PHYS SUBQ TISS 20 SQ CM ICD-10 Diagnosis Description L97.222 Non-pressure chronic ulcer of left calf with fat layer expo S81.812S Laceration without foreign body, left lower leg, sequela Modifier: sed Quantity: 1 Electronic Signature(s) Signed: 10/26/2017 3:51:43 PM By: Lawanda Cousins Entered By: Lawanda Cousins on 10/26/2017 15:51:43

## 2017-10-28 NOTE — Progress Notes (Signed)
Geoffrey, [name] ([id number]) Visit Report for 10/26/2017 Allergy List Details Patient Name: [name], [name] Date of Service: 10/26/2017 2:30 PM Medical Record Number: [id number] Patient Account Number: [id number] Date of Birth/Sex: 02-Aug-1928 (84 y.o. Male) Treating RN: Montey Hora Primary Care Nihar Klus: Lavone Orn Other Clinician: Referring Nikala Walsworth: Wenda Low Treating Aquilla Shambley/Extender: Lawanda Cousins Weeks in Treatment: 0 Allergies Active Allergies Septra Allergy Notes Electronic Signature(s) Signed: 10/26/2017 5:31:29 PM By: Montey Hora Entered By: Montey Hora on 10/26/2017 14:46:27 [name] ([id number]) -------------------------------------------------------------------------------- Arrival Information Details Patient Name: [name] Date of Service: 10/26/2017 2:30 PM Medical Record Number: [id number] Patient Account Number: [id number] Date of Birth/Sex: Jan 12, 1929 (84 y.o. Male) Treating RN: Montey Hora Primary Care Creola Krotz: Lavone Orn Other Clinician: Referring Ziah Turvey: Wenda Low Treating Aloura Matsuoka/Extender: Cathie Olden in Treatment: 0 Visit Information Patient Arrived: Ambulatory Arrival Time: 14:41 Accompanied By: self Transfer Assistance: None Patient Identification Verified: Yes Secondary Verification Process Yes Completed: Patient Has Alerts: Yes Patient Alerts: Patient on Blood Thinner Eliquis Electronic Signature(s) Signed: 10/26/2017 5:31:29 PM By: Montey Hora Entered By: Montey Hora on 10/26/2017 14:42:14 [name] ([id number]) -------------------------------------------------------------------------------- Encounter Discharge Information Details Patient Name: [name] Date of Service: 10/26/2017 2:30 PM Medical Record Number: [id number] Patient Account Number: [id number] Date of Birth/Sex: 1928/06/13 (84 y.o. Male) Treating RN: Cornell Barman Primary Care Arien Benincasa: Lavone Orn Other  Clinician: Referring Maximiano Lott: Wenda Low Treating Mariyana Fulop/Extender: Cathie Olden in Treatment: 0 Encounter Discharge Information Items Discharge Condition: Stable Ambulatory Status: Ambulatory Discharge Destination: Home Transportation: Private Auto Accompanied By: self Schedule Follow-up Appointment: Yes Clinical Summary of Care: Electronic Signature(s) Signed: 10/26/2017 5:02:07 PM By: Gretta Cool, BSN, RN, CWS, Kim RN, BSN Entered By: Gretta Cool, BSN, RN, CWS, Kim on 10/26/2017 15:36:40 [name] ([id number]) -------------------------------------------------------------------------------- Lower Extremity Assessment Details Patient Name: [name] Date of Service: 10/26/2017 2:30 PM Medical Record Number: [id number] Patient Account Number: [id number] Date of Birth/Sex: 04/21/1929 (84 y.o. Male) Treating RN: Montey Hora Primary Care Caitriona Sundquist: Lavone Orn Other Clinician: Referring Yahia Bottger: Wenda Low Treating Erika Hussar/Extender: Cathie Olden in Treatment: 0 Edema Assessment Assessed: [Left: No] [Right: No] [Left: Edema] [Right: :] Calf Left: Right: Point of Measurement: 34 cm From Medial Instep 34.4 cm 34.6 cm Ankle Left: Right: Point of Measurement: 12 cm From Medial Instep 21.7 cm 21.5 cm Vascular Assessment Pulses: Dorsalis Pedis Palpable: [Left:Yes] [Right:Yes] Doppler Audible: [Left:Yes] [Right:Yes] Posterior Tibial Palpable: [Left:Yes] [Right:Yes] Doppler Audible: [Left:Yes] [Right:Yes] Extremity colors, hair growth, and conditions: Extremity Color: [Left:Hyperpigmented] [Right:Hyperpigmented] Hair Growth on Extremity: [Left:Yes] [Right:Yes] Temperature of Extremity: [Left:Warm] [Right:Warm] Capillary Refill: [Left:< 3 seconds] [Right:< 3 seconds] Blood Pressure: Brachial: [Left:144] [Right:140] Dorsalis Pedis: 190 [Left:Dorsalis Pedis: 182] Ankle: Posterior Tibial: 192 [Left:Posterior Tibial: 210 1.33] [Right:1.46] Toe Nail  Assessment Left: Right: Thick: Yes Yes Discolored: Yes Yes Deformed: No No Improper Length and Hygiene: Yes Yes Electronic Signature(s) Signed: 10/26/2017 5:31:29 PM By: Montey Hora Entered By: Montey Hora on 10/26/2017 15:09:13 [name] ([id number]) Duanne Limerick, Juliann Pulse ([id number]) -------------------------------------------------------------------------------- Multi Wound Chart Details Patient Name: [name] Date of Service: 10/26/2017 2:30 PM Medical Record Number: [id number] Patient Account Number: [id number] Date of Birth/Sex: 08-16-1928 (84 y.o. Male) Treating RN: Roger Shelter Primary Care Raymon Schlarb: Lavone Orn Other Clinician: Referring Khalia Gong: Wenda Low Treating Jozsef Wescoat/Extender: Cathie Olden in Treatment: 0 Vital Signs Height(in): 69 Pulse(bpm): 60 Weight(lbs): 170 Blood Pressure(mmHg): 146/76 Body Mass Index(BMI): 25 Temperature(F): 97.6 Respiratory Rate 18 (breaths/min): Photos: [1:No Photos] [N/A:N/A] Wound Location: [1:Left Lower Leg - Anterior] [N/A:N/A] Wounding Event: [1:Trauma] [N/A:N/A] Primary Etiology: [1:Trauma, Other] [N/A:N/A] Comorbid History: [  1:Arrhythmia, Hypertension] [N/A:N/A] Date Acquired: [1:10/05/2017] [N/A:N/A] Weeks of Treatment: [1:0] [N/A:N/A] Wound Status: [1:Open] [N/A:N/A] Measurements L x W x D [1:3.9x2.8x0.2] [N/A:N/A] (cm) Area (cm) : [1:8.577] [N/A:N/A] Volume (cm) : [1:1.715] [N/A:N/A] Classification: [1:Full Thickness Without Exposed Support Structures] [N/A:N/A] Exudate Amount: [1:Large] [N/A:N/A] Exudate Type: [1:Serous] [N/A:N/A] Exudate Color: [1:amber] [N/A:N/A] Wound Margin: [1:Flat and Intact] [N/A:N/A] Granulation Amount: [1:None Present (0%)] [N/A:N/A] Necrotic Amount: [1:Large (67-100%)] [N/A:N/A] Necrotic Tissue: [1:Eschar, Adherent Slough] [N/A:N/A] Exposed Structures: [1:Fat Layer (Subcutaneous Tissue) Exposed: Yes Fascia: No Tendon: No Muscle: No Joint: No Bone: No]  [N/A:N/A] Epithelialization: [1:None] [N/A:N/A] Debridement: [1:Debridement - Excisional] [N/A:N/A] Pre-procedure [1:15:17] [N/A:N/A] Verification/Time Out Taken: Pain Control: [1:Other] [N/A:N/A] Tissue Debrided: [1:Subcutaneous, Slough] [N/A:N/A] Level: [1:Skin/Subcutaneous Tissue] [N/A:N/A] Debridement Area (sq cm): [1:10.92] [N/A:N/A] Instrument: [1:Curette] [N/A:N/A] Bleeding: Minimum N/A N/A Hemostasis Achieved: Pressure N/A N/A Procedural Pain: 0 N/A N/A Post Procedural Pain: 0 N/A N/A Debridement Treatment Procedure was tolerated well N/A N/A Response: Post Debridement 3.9x2.8x0.2 N/A N/A Measurements L x W x D (cm) Post Debridement Volume: 1.715 N/A N/A (cm) Periwound Skin Texture: Excoriation: No N/A N/A Induration: No Callus: No Crepitus: No Rash: No Scarring: No Periwound Skin Moisture: Maceration: Yes N/A N/A Dry/Scaly: No Periwound Skin Color: Erythema: Yes N/A N/A Atrophie Blanche: No Cyanosis: No Ecchymosis: No Hemosiderin Staining: No Mottled: No Pallor: No Rubor: No Erythema Location: Circumferential N/A N/A Temperature: No Abnormality N/A N/A Tenderness on Palpation: No N/A N/A Wound Preparation: Ulcer Cleansing: N/A N/A Rinsed/Irrigated with Saline Topical Anesthetic Applied: Other: lidocaine 4% Procedures Performed: Debridement N/A N/A Treatment Notes Wound #1 (Left, Anterior Lower Leg) 1. Cleansed with: Clean wound with Normal Saline 4. Dressing Applied: Iodoflex 5. Secondary Dressing Applied ABD Pad Notes ABD, ace wrap Electronic Signature(s) Signed: 10/26/2017 3:36:43 PM By: Lawanda Cousins Entered By: Lawanda Cousins on 10/26/2017 15:36:43 [name] ([id number]) -------------------------------------------------------------------------------- [place] Details Patient Name: [name] Date of Service: 10/26/2017 2:30 PM Medical Record Number: [id number] Patient Account Number: [id number] Date of  Birth/Sex: 02/11/1929 (84 y.o. Male) Treating RN: Roger Shelter Primary Care Alyssa Mancera: Lavone Orn Other Clinician: Referring Maymunah Stegemann: Wenda Low Treating Kaleia Longhi/Extender: Cathie Olden in Treatment: 0 Active Inactive ` Orientation to the Wound Care Program Nursing Diagnoses: Knowledge deficit related to the wound healing center program Goals: Patient/caregiver will verbalize understanding of the [facility] Program Date Initiated: 10/26/2017 Target Resolution Date: 11/16/2017 Goal Status: Active Interventions: Provide education on orientation to the wound center Notes: ` Wound/Skin Impairment Nursing Diagnoses: Impaired tissue integrity Goals: Patient/caregiver will verbalize understanding of skin care regimen Date Initiated: 10/26/2017 Target Resolution Date: 11/16/2017 Goal Status: Active Ulcer/skin breakdown will have a volume reduction of 30% by week 4 Date Initiated: 10/26/2017 Target Resolution Date: 11/16/2017 Goal Status: Active Interventions: Assess patient/caregiver ability to obtain necessary supplies Assess patient/caregiver ability to perform ulcer/skin care regimen upon admission and as needed Assess ulceration(s) every visit Treatment Activities: Skin care regimen initiated : 10/26/2017 Notes: Electronic Signature(s) Signed: 10/26/2017 4:33:19 PM By: Stanford Breed, Juliann Pulse ([id number]) Entered By: Roger Shelter on 10/26/2017 15:14:56 [name] ([id number]) -------------------------------------------------------------------------------- Pain Assessment Details Patient Name: [name] Date of Service: 10/26/2017 2:30 PM Medical Record Number: [id number] Patient Account Number: [id number] Date of Birth/Sex: [birth year] (84 y.o. Male) Treating RN: Montey Hora Primary Care Jaleisa Brose: Lavone Orn Other Clinician: Referring Jobeth Pangilinan: Wenda Low Treating Sonora Catlin/Extender: Cathie Olden in Treatment:  0 Active Problems Location of Pain Severity and Description of Pain Patient Has Paino No Site Locations Pain Management and Medication Current  Pain Management: Electronic Signature(s) Signed: 10/26/2017 5:31:29 PM By: Montey Hora Entered By: Montey Hora on 10/26/2017 14:43:52 [name] ([id number]) -------------------------------------------------------------------------------- Patient/Caregiver Education Details Patient Name: [name] Date of Service: 10/26/2017 2:30 PM Medical Record Number: [id number] Patient Account Number: [id number] Date of Birth/Gender: Oct 07, 1928 (84 y.o. Male) Treating RN: Cornell Barman Primary Care Physician: Lavone Orn Other Clinician: Referring Physician: Wenda Low Treating Physician/Extender: Cathie Olden in Treatment: 0 Education Assessment Education Provided To: Patient Education Topics Provided Wound/Skin Impairment: Handouts: Caring for Your Ulcer Methods: Demonstration, Explain/Verbal Responses: State content correctly Electronic Signature(s) Signed: 10/26/2017 5:02:07 PM By: Gretta Cool, BSN, RN, CWS, Kim RN, BSN Entered By: Gretta Cool, BSN, RN, CWS, Kim on 10/26/2017 15:57:13 [name] ([id number]) -------------------------------------------------------------------------------- Wound Assessment Details Patient Name: [name] Date of Service: 10/26/2017 2:30 PM Medical Record Number: [id number] Patient Account Number: [id number] Date of Birth/Sex: 1929-03-20 (84 y.o. Male) Treating RN: Montey Hora Primary Care Ly Wass: Lavone Orn Other Clinician: Referring Savannha Welle: Wenda Low Treating Matteo Banke/Extender: Cathie Olden in Treatment: 0 Wound Status Wound Number: 1 Primary Etiology: Trauma, Other Wound Location: Left Lower Leg - Anterior Wound Status: Open Wounding Event: Trauma Comorbid History: Arrhythmia, Hypertension Date Acquired: 10/05/2017 Weeks Of Treatment: 0 Clustered Wound:  No Photos Photo Uploaded By: Montey Hora on 10/26/2017 16:50:32 Wound Measurements Length: (cm) 3.9 Width: (cm) 2.8 Depth: (cm) 0.2 Area: (cm) 8.577 Volume: (cm) 1.715 % Reduction in Area: % Reduction in Volume: Epithelialization: None Tunneling: No Undermining: No Wound Description Full Thickness Without Exposed Support Classification: Structures Wound Margin: Flat and Intact Exudate Large Amount: Exudate Type: Serous Exudate Color: amber Foul Odor After Cleansing: No Slough/Fibrino Yes Wound Bed Granulation Amount: None Present (0%) Exposed Structure Necrotic Amount: Large (67-100%) Fascia Exposed: No Necrotic Quality: Eschar, Adherent Slough Fat Layer (Subcutaneous Tissue) Exposed: Yes Tendon Exposed: No Muscle Exposed: No Joint Exposed: No Bone Exposed: No Baltazar, Admiral ([id number]) Periwound Skin Texture Texture Color No Abnormalities Noted: No No Abnormalities Noted: No Callus: No Atrophie Blanche: No Crepitus: No Cyanosis: No Excoriation: No Ecchymosis: No Induration: No Erythema: Yes Rash: No Erythema Location: Circumferential Scarring: No Hemosiderin Staining: No Mottled: No Moisture Pallor: No No Abnormalities Noted: No Rubor: No Dry / Scaly: No Maceration: Yes Temperature / Pain Temperature: No Abnormality Wound Preparation Ulcer Cleansing: Rinsed/Irrigated with Saline Topical Anesthetic Applied: Other: lidocaine 4%, Treatment Notes Wound #1 (Left, Anterior Lower Leg) 1. Cleansed with: Clean wound with Normal Saline 4. Dressing Applied: Iodoflex 5. Secondary Dressing Applied ABD Pad Notes ABD, ace wrap Electronic Signature(s) Signed: 10/26/2017 5:31:29 PM By: Montey Hora Entered By: Montey Hora on 10/26/2017 14:59:52 [name] ([id number]) -------------------------------------------------------------------------------- [place] Details Patient Name: [name] Date of Service: 10/26/2017 2:30 PM Medical  Record Number: [id number] Patient Account Number: [id number] Date of Birth/Sex: 06-10-1928 (84 y.o. Male) Treating RN: Montey Hora Primary Care Jaquaya Coyle: Lavone Orn Other Clinician: Referring Tunis Gentle: Wenda Low Treating Larenda Reedy/Extender: Cathie Olden in Treatment: 0 Vital Signs Time Taken: 14:44 Temperature (F): 97.6 Height (in): 69 Pulse (bpm): 68 Source: Measured Respiratory Rate (breaths/min): 18 Weight (lbs): 170 Blood Pressure (mmHg): 146/76 Source: Measured Reference Range: 80 - 120 mg / dl Body Mass Index (BMI): 25.1 Electronic Signature(s) Signed: 10/26/2017 5:31:29 PM By: Montey Hora Entered By: Montey Hora on 10/26/2017 14:46:15

## 2017-10-31 ENCOUNTER — Ambulatory Visit: Payer: Medicare Other | Admitting: Physician Assistant

## 2017-11-02 ENCOUNTER — Encounter: Payer: Medicare Other | Admitting: Nurse Practitioner

## 2017-11-02 DIAGNOSIS — L97222 Non-pressure chronic ulcer of left calf with fat layer exposed: Secondary | ICD-10-CM | POA: Diagnosis not present

## 2017-11-02 DIAGNOSIS — L97822 Non-pressure chronic ulcer of other part of left lower leg with fat layer exposed: Secondary | ICD-10-CM | POA: Diagnosis not present

## 2017-11-05 NOTE — Progress Notes (Signed)
BRENNAN, LITZINGER ([id number]) Visit Report for 11/02/2017 Arrival Information Details Patient Name: [name], [name] Date of Service: 11/02/2017 2:45 PM Medical Record Number: [id number] Patient Account Number: [id number] Date of Birth/Sex: 10/06/1928 (84 y.o. M) Treating RN: Montey Hora Primary Care Anam Bobby: Lavone Orn Other Clinician: Referring Gala Padovano: Lavone Orn Treating Leland Staszewski/Extender: Cathie Olden in Treatment: 1 Visit Information History Since Last Visit Added or deleted any medications: No Patient Arrived: Ambulatory Any new allergies or adverse reactions: No Arrival Time: 14:34 Had a fall or experienced change in No Accompanied By: self activities of daily living that may affect Transfer Assistance: None risk of falls: Patient Identification Verified: Yes Signs or symptoms of abuse/neglect since last visito No Secondary Verification Process Yes Hospitalized since last visit: No Completed: Implantable device outside of the clinic excluding No Patient Has Alerts: Yes cellular tissue based products placed in the center Patient Alerts: Patient on Blood since last visit: Thinner Has Dressing in Place as Prescribed: Yes Eliquis Pain Present Now: No Electronic Signature(s) Signed: 11/02/2017 4:32:43 PM By: Montey Hora Entered By: Montey Hora on 11/02/2017 14:35:50 [name] ([id number]) -------------------------------------------------------------------------------- Encounter Discharge Information Details Patient Name: [name] Date of Service: 11/02/2017 2:45 PM Medical Record Number: [id number] Patient Account Number: [id number] Date of Birth/Sex: Nov 02, 1928 (84 y.o. M) Treating RN: Roger Shelter Primary Care Jiyan Walkowski: Lavone Orn Other Clinician: Referring Jon Kasparek: Lavone Orn Treating Shamicka Inga/Extender: Cathie Olden in Treatment: 1 Encounter Discharge Information Items Discharge Condition: Stable Ambulatory Status:  Ambulatory Discharge Destination: Home Transportation: Private Auto Schedule Follow-up Appointment: Yes Clinical Summary of Care: Electronic Signature(s) Signed: 11/02/2017 4:00:46 PM By: Roger Shelter Entered By: Roger Shelter on 11/02/2017 15:09:39 [name] ([id number]) -------------------------------------------------------------------------------- Lower Extremity Assessment Details Patient Name: [name] Date of Service: 11/02/2017 2:45 PM Medical Record Number: [id number] Patient Account Number: [id number] Date of Birth/Sex: [birth year] (84 y.o. M) Treating RN: Montey Hora Primary Care Ajene Carchi: Lavone Orn Other Clinician: Referring Kyair Ditommaso: Lavone Orn Treating Allure Greaser/Extender: Lawanda Cousins Weeks in Treatment: 1 Edema Assessment Assessed: [Left: No] [Right: No] Edema: [Left: No] [Right: No] Vascular Assessment Pulses: Dorsalis Pedis Palpable: [Left:Yes] Posterior Tibial Extremity colors, hair growth, and conditions: Extremity Color: [Left:Hyperpigmented] Hair Growth on Extremity: [Left:Yes] Temperature of Extremity: [Left:Warm] Capillary Refill: [Left:< 3 seconds] Toe Nail Assessment Left: Right: Thick: Yes Discolored: Yes Deformed: No Improper Length and Hygiene: No Electronic Signature(s) Signed: 11/02/2017 4:32:43 PM By: Montey Hora Entered By: Montey Hora on 11/02/2017 14:41:24 [name] ([id number]) -------------------------------------------------------------------------------- Multi Wound Chart Details Patient Name: [name] Date of Service: 11/02/2017 2:45 PM Medical Record Number: [id number] Patient Account Number: [id number] Date of Birth/Sex: 23-Jun-1928 (84 y.o. M) Treating RN: Ahmed Prima Primary Care Jerrel Tiberio: Lavone Orn Other Clinician: Referring Briauna Gilmartin: Lavone Orn Treating Yazhini Mcaulay/Extender: Cathie Olden in Treatment: 1 Vital Signs Height(in): 69 Pulse(bpm): 84 Weight(lbs):  170 Blood Pressure(mmHg): 131/57 Body Mass Index(BMI): 25 Temperature(F): 97.6 Respiratory Rate 16 (breaths/min): Photos: [1:No Photos] [N/A:N/A] Wound Location: [1:Left Lower Leg - Anterior] [N/A:N/A] Wounding Event: [1:Trauma] [N/A:N/A] Primary Etiology: [1:Trauma, Other] [N/A:N/A] Comorbid History: [1:Arrhythmia, Hypertension] [N/A:N/A] Date Acquired: [1:10/05/2017] [N/A:N/A] Weeks of Treatment: [1:1] [N/A:N/A] Wound Status: [1:Open] [N/A:N/A] Measurements L x W x D [1:2.1x2.8x0.2] [N/A:N/A] (cm) Area (cm) : [1:4.618] [N/A:N/A] Volume (cm) : [1:0.924] [N/A:N/A] % Reduction in Area: [1:46.20%] [N/A:N/A] % Reduction in Volume: [1:46.10%] [N/A:N/A] Classification: [1:Full Thickness Without Exposed Support Structures] [N/A:N/A] Exudate Amount: [1:Large] [N/A:N/A] Exudate Type: [1:Serous] [N/A:N/A] Exudate Color: [1:amber] [N/A:N/A] Wound Margin: [1:Flat and Intact] [N/A:N/A] Granulation Amount: [1:Small (1-33%)] [N/A:N/A] Granulation Quality: [1:Red] [N/A:N/A] Necrotic  Amount: [1:Large (67-100%)] [N/A:N/A] Exposed Structures: [1:Fat Layer (Subcutaneous Tissue) Exposed: Yes Fascia: No Tendon: No Muscle: No Joint: No Bone: No] [N/A:N/A] Epithelialization: [1:None] [N/A:N/A] Debridement: [1:Debridement - Excisional] [N/A:N/A] Pre-procedure [1:14:55] [N/A:N/A] Verification/Time Out Taken: Pain Control: [1:Lidocaine 4% Topical Solution] [N/A:N/A] Tissue Debrided: [1:Subcutaneous, Slough] [N/A:N/A] Level: [1:Skin/Subcutaneous Tissue] [N/A:N/A] Debridement Area (sq cm): 5.88 N/A N/A Instrument: Curette N/A N/A Bleeding: Minimum N/A N/A Hemostasis Achieved: Pressure N/A N/A Procedural Pain: 0 N/A N/A Post Procedural Pain: 0 N/A N/A Debridement Treatment Procedure was tolerated well N/A N/A Response: Post Debridement 2.1x2.8x0.3 N/A N/A Measurements L x W x D (cm) Post Debridement Volume: 1.385 N/A N/A (cm) Periwound Skin Texture: Excoriation: No N/A N/A Induration:  No Callus: No Crepitus: No Rash: No Scarring: No Periwound Skin Moisture: Maceration: Yes N/A N/A Dry/Scaly: No Periwound Skin Color: Erythema: Yes N/A N/A Atrophie Blanche: No Cyanosis: No Ecchymosis: No Hemosiderin Staining: No Mottled: No Pallor: No Rubor: No Erythema Location: Circumferential N/A N/A Temperature: No Abnormality N/A N/A Tenderness on Palpation: No N/A N/A Wound Preparation: Ulcer Cleansing: N/A N/A Rinsed/Irrigated with Saline Topical Anesthetic Applied: Other: lidocaine 4% Procedures Performed: Debridement N/A N/A Treatment Notes Electronic Signature(s) Signed: 11/02/2017 3:02:25 PM By: Lawanda Cousins Entered By: Lawanda Cousins on 11/02/2017 15:02:25 [name] ([id number]) -------------------------------------------------------------------------------- Multi-Disciplinary Care Plan Details Patient Name: [name] Date of Service: 11/02/2017 2:45 PM Medical Record Number: [id number] Patient Account Number: [id number] Date of Birth/Sex: 12-26-1928 (84 y.o. M) Treating RN: Ahmed Prima Primary Care Mazal Ebey: Lavone Orn Other Clinician: Referring Veva Grimley: Lavone Orn Treating Sarika Baldini/Extender: Cathie Olden in Treatment: 1 Active Inactive ` Orientation to the Wound Care Program Nursing Diagnoses: Knowledge deficit related to the wound healing center program Goals: Patient/caregiver will verbalize understanding of the [place] Program Date Initiated: 10/26/2017 Target Resolution Date: 11/16/2017 Goal Status: Active Interventions: Provide education on orientation to the wound center Notes: ` Wound/Skin Impairment Nursing Diagnoses: Impaired tissue integrity Goals: Patient/caregiver will verbalize understanding of skin care regimen Date Initiated: 10/26/2017 Target Resolution Date: 11/16/2017 Goal Status: Active Ulcer/skin breakdown will have a volume reduction of 30% by week 4 Date Initiated: 10/26/2017 Target  Resolution Date: 11/16/2017 Goal Status: Active Interventions: Assess patient/caregiver ability to obtain necessary supplies Assess patient/caregiver ability to perform ulcer/skin care regimen upon admission and as needed Assess ulceration(s) every visit Treatment Activities: Skin care regimen initiated : 10/26/2017 Notes: Electronic Signature(s) Signed: 11/03/2017 5:38:20 PM By: Merton Border, Juliann Pulse ([id number]) Entered By: Alric Quan on 11/02/2017 14:56:22 [name] ([id number]) -------------------------------------------------------------------------------- Pain Assessment Details Patient Name: [name] Date of Service: 11/02/2017 2:45 PM Medical Record Number: [id number] Patient Account Number: [id number] Date of Birth/Sex: 31-Jul-1928 (84 y.o. M) Treating RN: Montey Hora Primary Care Ovadia Lopp: Lavone Orn Other Clinician: Referring Abdulrahman Bracey: Lavone Orn Treating Kaelah Hayashi/Extender: Cathie Olden in Treatment: 1 Active Problems Location of Pain Severity and Description of Pain Patient Has Paino No Site Locations Pain Management and Medication Current Pain Management: Electronic Signature(s) Signed: 11/02/2017 4:32:43 PM By: Montey Hora Entered By: Montey Hora on 11/02/2017 14:35:57 [name] ([id number]) -------------------------------------------------------------------------------- Patient/Caregiver Education Details Patient Name: [name] Date of Service: 11/02/2017 2:45 PM Medical Record Number: [id number] Patient Account Number: [id number] Date of Birth/Gender: [birth year] (84 y.o. M) Treating RN: Roger Shelter Primary Care Physician: Lavone Orn Other Clinician: Referring Physician: Lavone Orn Treating Physician/Extender: Cathie Olden in Treatment: 1 Education Assessment Education Provided To: Patient Education Topics Provided Wound Debridement: Handouts: Wound Debridement Methods:  Explain/Verbal Responses: State content correctly Wound/Skin Impairment: Handouts: Caring  for Your Ulcer Methods: Explain/Verbal Responses: State content correctly Electronic Signature(s) Signed: 11/02/2017 4:00:46 PM By: Roger Shelter Entered By: Roger Shelter on 11/02/2017 15:09:54 [name] ([id number]) -------------------------------------------------------------------------------- Wound Assessment Details Patient Name: [name] Date of Service: 11/02/2017 2:45 PM Medical Record Number: [id number] Patient Account Number: [id number] Date of Birth/Sex: [birth year] (84 y.o. M) Treating RN: Montey Hora Primary Care Kinaya Hilliker: Lavone Orn Other Clinician: Referring Javaun Dimperio: Lavone Orn Treating Kassity Woodson/Extender: Lawanda Cousins Weeks in Treatment: 1 Wound Status Wound Number: 1 Primary Etiology: Trauma, Other Wound Location: Left Lower Leg - Anterior Wound Status: Open Wounding Event: Trauma Comorbid History: Arrhythmia, Hypertension Date Acquired: 10/05/2017 Weeks Of Treatment: 1 Clustered Wound: No Photos Photo Uploaded By: Montey Hora on 11/02/2017 16:59:57 Wound Measurements Length: (cm) 2.1 Width: (cm) 2.8 Depth: (cm) 0.2 Area: (cm) 4.618 Volume: (cm) 0.924 % Reduction in Area: 46.2% % Reduction in Volume: 46.1% Epithelialization: None Tunneling: No Undermining: No Wound Description Full Thickness Without Exposed Support Classification: Structures Wound Margin: Flat and Intact Exudate Large Amount: Exudate Type: Serous Exudate Color: amber Foul Odor After Cleansing: No Slough/Fibrino Yes Wound Bed Granulation Amount: Small (1-33%) Exposed Structure Granulation Quality: Red Fascia Exposed: No Necrotic Amount: Large (67-100%) Fat Layer (Subcutaneous Tissue) Exposed: Yes Necrotic Quality: Adherent Slough Tendon Exposed: No Muscle Exposed: No Joint Exposed: No Bone Exposed: No Welborn, Damauri ([id number]) Periwound Skin  Texture Texture Color No Abnormalities Noted: No No Abnormalities Noted: No Callus: No Atrophie Blanche: No Crepitus: No Cyanosis: No Excoriation: No Ecchymosis: No Induration: No Erythema: Yes Rash: No Erythema Location: Circumferential Scarring: No Hemosiderin Staining: No Mottled: No Moisture Pallor: No No Abnormalities Noted: No Rubor: No Dry / Scaly: No Maceration: Yes Temperature / Pain Temperature: No Abnormality Wound Preparation Ulcer Cleansing: Rinsed/Irrigated with Saline Topical Anesthetic Applied: Other: lidocaine 4%, Treatment Notes Wound #1 (Left, Anterior Lower Leg) 1. Cleansed with: Clean wound with Normal Saline 2. Anesthetic Topical Lidocaine 4% cream to wound bed prior to debridement 4. Dressing Applied: Prisma Ag 5. Secondary Dressing Applied Bordered Foam Dressing Electronic Signature(s) Signed: 11/02/2017 4:32:43 PM By: Montey Hora Entered By: Montey Hora on 11/02/2017 14:40:52 [name] ([id number]) -------------------------------------------------------------------------------- [place] Details Patient Name: [name] Date of Service: 11/02/2017 2:45 PM Medical Record Number: [id number] Patient Account Number: [id number] Date of Birth/Sex: Apr 22, 1929 (84 y.o. M) Treating RN: Montey Hora Primary Care Krish Bailly: Lavone Orn Other Clinician: Referring Adriannah Steinkamp: Lavone Orn Treating Mardell Cragg/Extender: Cathie Olden in Treatment: 1 Vital Signs Time Taken: 14:36 Temperature (F): 97.6 Height (in): 69 Pulse (bpm): 67 Weight (lbs): 170 Respiratory Rate (breaths/min): 16 Body Mass Index (BMI): 25.1 Blood Pressure (mmHg): 131/57 Reference Range: 80 - 120 mg / dl Electronic Signature(s) Signed: 11/02/2017 4:32:43 PM By: Montey Hora Entered By: Montey Hora on 11/02/2017 14:37:58

## 2017-11-05 NOTE — Progress Notes (Signed)
KAMDIN, FOLLETT ([id number]) Visit Report for 11/02/2017 Chief Complaint Document Details Patient Name: [name], [name] Date of Service: 11/02/2017 2:45 PM Medical Record Number: [id number] Patient Account Number: [id number] Date of Birth/Sex: 1929/03/08 (84 y.o. M) Treating RN: Ahmed Prima Primary Care Provider: Lavone Orn Other Clinician: Referring Provider: Lavone Orn Treating Provider/Extender: Cathie Olden in Treatment: 1 Information Obtained from: Patient Chief Complaint He is here for left lower extremity wound Electronic Signature(s) Signed: 11/02/2017 3:02:43 PM By: Lawanda Cousins Entered By: Lawanda Cousins on 11/02/2017 15:02:42 [name] ([id number]) -------------------------------------------------------------------------------- Debridement Details Patient Name: [name] Date of Service: 11/02/2017 2:45 PM Medical Record Number: [id number] Patient Account Number: [id number] Date of Birth/Sex: 05-Mar-1929 (84 y.o. M) Treating RN: Ahmed Prima Primary Care Provider: Lavone Orn Other Clinician: Referring Provider: Lavone Orn Treating Provider/Extender: Cathie Olden in Treatment: 1 Debridement Performed for Wound #1 Left,Anterior Lower Leg Assessment: Performed By: Physician Lawanda Cousins, NP Debridement Type: Debridement Pre-procedure Verification/Time Yes - 14:55 Out Taken: Start Time: 14:55 Pain Control: Lidocaine 4% Topical Solution Total Area Debrided (L x W): 2.1 (cm) x 2.8 (cm) = 5.88 (cm) Tissue and other material Viable, Non-Viable, Slough, Subcutaneous, Fibrin/Exudate, Slough debrided: Level: Skin/Subcutaneous Tissue Debridement Description: Excisional Instrument: Curette Bleeding: Minimum Hemostasis Achieved: Pressure End Time: 14:58 Procedural Pain: 0 Post Procedural Pain: 0 Response to Treatment: Procedure was tolerated well Level of Consciousness: Awake and Alert Post Procedure Vitals: Temperature:  97.6 Pulse: 67 Respiratory Rate: 16 Blood Pressure: Systolic Blood Pressure: 937 Diastolic Blood Pressure: 57 Post Debridement Measurements of Total Wound Length: (cm) 2.1 Width: (cm) 2.8 Depth: (cm) 0.3 Volume: (cm) 1.385 Character of Wound/Ulcer Post Debridement: Requires Further Debridement Post Procedure Diagnosis Same as Pre-procedure Electronic Signature(s) Signed: 11/03/2017 6:54:27 AM By: Lawanda Cousins Signed: 11/03/2017 5:38:20 PM By: Alric Quan Entered By: Alric Quan on 11/02/2017 14:59:39 [name] ([id number]) -------------------------------------------------------------------------------- HPI Details Patient Name: [name] Date of Service: 11/02/2017 2:45 PM Medical Record Number: [id number] Patient Account Number: [id number] Date of Birth/Sex: 1929/01/15 (84 y.o. M) Treating RN: Ahmed Prima Primary Care Provider: Lavone Orn Other Clinician: Referring Provider: Lavone Orn Treating Provider/Extender: Cathie Olden in Treatment: 1 History of Present Illness HPI Description: 10/26/17-He is here for an initial evaluation for left lower extremity traumatic injury. He states that on approximate 3 weeks ago he injured his leg with a step stool while cleaning his car. He saw Dr. Deforest Hoyles at [facility] on 5/15 who prescribed doxycycline; he had a follow-up on 5/20 when he was referred to the wound clinic and instructed to continue doxycycline and Neosporin. He denies pain, he denies edema. He is nondiabetic and a former smoker, quit in 1968. He does wear compression stockings/wraps. 11/02/17-He is here for follow-up evaluation of the left lower extremity ulcer. There is significant improvement in both measurements and appearance. He completed doxycycline on Saturday. We will change to [facility] and he will follow-up next week Electronic Signature(s) Signed: 11/02/2017 3:03:22 PM By: Lawanda Cousins Entered By: Lawanda Cousins on 11/02/2017  15:03:22 [name] ([id number]) -------------------------------------------------------------------------------- Physician Orders Details Patient Name: [name] Date of Service: 11/02/2017 2:45 PM Medical Record Number: [id number] Patient Account Number: [id number] Date of Birth/Sex: 27-Jan-1929 (84 y.o. M) Treating RN: Ahmed Prima Primary Care Provider: Lavone Orn Other Clinician: Referring Provider: Lavone Orn Treating Provider/Extender: Cathie Olden in Treatment: 1 Verbal / Phone Orders: Yes Clinician: Pinkerton, Debi Read Back and Verified: Yes Diagnosis Coding Wound Cleansing Wound #1 Left,Anterior Lower Leg o Clean wound with Normal Saline. o Cleanse  wound with mild soap and water Anesthetic (add to Medication List) Wound #1 Left,Anterior Lower Leg o Topical Lidocaine 4% cream applied to wound bed prior to debridement (In Clinic Only). Skin Barriers/Peri-Wound Care Wound #1 Left,Anterior Lower Leg o Skin Prep Primary Wound Dressing Wound #1 Left,Anterior Lower Leg o Silver Collagen Dressing Change Frequency Wound #1 Left,Anterior Lower Leg o Dressing is to be changed Monday and Thursday. Follow-up Appointments Wound #1 Left,Anterior Lower Leg o Return Appointment in 1 week. Edema Control o Other: - ace wrap Patient Medications Allergies: Septra Notifications Medication Indication Start End lidocaine DOSE 1 - topical 4 % cream - 1 cream topical Electronic Signature(s) Signed: 11/03/2017 6:54:27 AM By: Lawanda Cousins Signed: 11/03/2017 5:38:20 PM By: Alric Quan Entered By: Alric Quan on 11/02/2017 15:01:52 [name] ([id number]) [name], [name] ([id number]) -------------------------------------------------------------------------------- Prescription 11/02/2017 Patient Name: [name] Provider: Lawanda Cousins NP Date of Birth: 03-06-1929 NPI#: [id number] Sex: M DEA#: [id number] Phone #: [phone]  License #: Patient Address: [facility] [place], [place] [address] [address] [place], [place] [address] [phone] Allergies Septra Medication Medication: Route: Strength: Form: lidocaine topical 4% cream Class: TOPICAL LOCAL ANESTHETICS Dose: Frequency / Time: Indication: 1 1 cream topical Number of Refills: Number of Units: 0 Generic Substitution: Start Date: End Date: Administered at Substitution Permitted Facility: Yes Time Administered: Time Discontinued: Note to Pharmacy: Signature(s): Date(s): Electronic Signature(s) Signed: 11/03/2017 6:54:27 AM By: Lawanda Cousins Signed: 11/03/2017 5:38:20 PM By: Alric Quan Entered By: Alric Quan on 11/02/2017 15:01:53 [name] ([id number]) [name] ([id number]) --------------------------------------------------------------------------------  Problem List Details Patient Name: [name] Date of Service: 11/02/2017 2:45 PM Medical Record Number: [id number] Patient Account Number: [id number] Date of Birth/Sex: [birth year] (84 y.o. M) Treating RN: Ahmed Prima Primary Care Provider: Lavone Orn Other Clinician: Referring Provider: Lavone Orn Treating Provider/Extender: Cathie Olden in Treatment: 1 Active Problems ICD-10 Impacting Encounter Code Description Active Date Wound Healing Diagnosis L97.222 Non-pressure chronic ulcer of left calf with fat layer exposed 10/26/2017 No Yes S81.812S Laceration without foreign body, left lower leg, sequela 10/26/2017 No Yes Inactive Problems Resolved Problems Electronic Signature(s) Signed: 11/02/2017 3:02:18 PM By: Lawanda Cousins Entered By: Lawanda Cousins on 11/02/2017 15:02:17 [name] ([id number]) -------------------------------------------------------------------------------- Progress Note Details Patient Name: [name] Date of  Service: 11/02/2017 2:45 PM Medical Record Number: [id number] Patient Account Number: [id number] Date of Birth/Sex: 1928-12-16 (84 y.o. M) Treating RN: Ahmed Prima Primary Care Provider: Lavone Orn Other Clinician: Referring Provider: Lavone Orn Treating Provider/Extender: Cathie Olden in Treatment: 1 Subjective Chief Complaint Information obtained from Patient He is here for left lower extremity wound History of Present Illness (HPI) 10/26/17-He is here for an initial evaluation for left lower extremity traumatic injury. He states that on approximate 3 weeks ago he injured his leg with a step stool while cleaning his car. He saw Dr. Deforest Hoyles at [facility] on 5/15 who prescribed doxycycline; he had a follow-up on 5/20 when he was referred to the wound clinic and instructed to continue doxycycline and Neosporin. He denies pain, he denies edema. He is nondiabetic and a former smoker, quit in 1968. He does wear compression stockings/wraps. 11/02/17-He is here for follow-up evaluation of the left lower extremity ulcer. There is significant improvement in both measurements and appearance. He completed doxycycline on Saturday. We will change to [facility] and he will follow-up next week Patient History Information obtained from Patient. Family History Heart Disease - Father,Mother, Hypertension -  Mother,Father, No family history of Cancer, Hereditary Spherocytosis, Kidney Disease, Lung Disease, Seizures, Stroke, Thyroid Problems, Tuberculosis. Social History Former smoker - quit 50 years ago, Alcohol Use - Never, Drug Use - No History, Caffeine Use - Daily. Medical And Surgical History Notes Oncologic multiple melanomas Objective Constitutional Vitals Time Taken: 2:36 PM, Height: 69 in, Weight: 170 lbs, BMI: 25.1, Temperature: 97.6 F, Pulse: 67 bpm, Respiratory Rate: 16 breaths/min, Blood Pressure: 131/57 mmHg. [name], [name] ([id number]) Integumentary (Hair,  Skin) Wound #1 status is Open. Original cause of wound was Trauma. The wound is located on the Left,Anterior Lower Leg. The wound measures 2.1cm length x 2.8cm width x 0.2cm depth; 4.618cm^2 area and 0.924cm^3 volume. There is Fat Layer (Subcutaneous Tissue) Exposed exposed. There is no tunneling or undermining noted. There is a large amount of serous drainage noted. The wound margin is flat and intact. There is small (1-33%) red granulation within the wound bed. There is a large (67-100%) amount of necrotic tissue within the wound bed including Adherent Slough. The periwound skin appearance exhibited: Maceration, Erythema. The periwound skin appearance did not exhibit: Callus, Crepitus, Excoriation, Induration, Rash, Scarring, Dry/Scaly, Atrophie Blanche, Cyanosis, Ecchymosis, Hemosiderin Staining, Mottled, Pallor, Rubor. The surrounding wound skin color is noted with erythema which is circumferential. Periwound temperature was noted as No Abnormality. Assessment Active Problems ICD-10 L97.222 - Non-pressure chronic ulcer of left calf with fat layer exposed S81.812S - Laceration without foreign body, left lower leg, sequela Procedures Wound #1 Pre-procedure diagnosis of Wound #1 is a Trauma, Other located on the Left,Anterior Lower Leg . There was a Excisional Skin/Subcutaneous Tissue Debridement with a total area of 5.88 sq cm performed by Lawanda Cousins, NP. With the following instrument(s): Curette to remove Viable and Non-Viable tissue/material. Material removed includes Subcutaneous Tissue, Slough, and Fibrin/Exudate after achieving pain control using Lidocaine 4% Topical Solution. No specimens were taken. A time out was conducted at 14:55, prior to the start of the procedure. A Minimum amount of bleeding was controlled with Pressure. The procedure was tolerated well with a pain level of 0 throughout and a pain level of 0 following the procedure. Patient s Level of Consciousness post  procedure was recorded as Awake and Alert. Post Debridement Measurements: 2.1cm length x 2.8cm width x 0.3cm depth; 1.385cm^3 volume. Character of Wound/Ulcer Post Debridement requires further debridement. Post procedure Diagnosis Wound #1: Same as Pre-Procedure Plan Wound Cleansing: Wound #1 Left,Anterior Lower Leg: Clean wound with Normal Saline. Cleanse wound with mild soap and water Anesthetic (add to Medication List): Wound #1 Left,Anterior Lower Leg: Topical Lidocaine 4% cream applied to wound bed prior to debridement (In Clinic Only). Skin Barriers/Peri-Wound Care: Wound #1 Left,Anterior Lower Leg: [name], [name] ([id number]) Skin Prep Primary Wound Dressing: Wound #1 Left,Anterior Lower Leg: Silver Collagen Dressing Change Frequency: Wound #1 Left,Anterior Lower Leg: Dressing is to be changed Monday and Thursday. Follow-up Appointments: Wound #1 Left,Anterior Lower Leg: Return Appointment in 1 week. Edema Control: Other: - ace wrap The following medication(s) was prescribed: lidocaine topical 4 % cream 1 1 cream topical was prescribed at facility Electronic Signature(s) Signed: 11/02/2017 3:03:47 PM By: Lawanda Cousins Entered By: Lawanda Cousins on 11/02/2017 15:03:47 [name] ([id number]) -------------------------------------------------------------------------------- ROS/PFSH Details Patient Name: [name] Date of Service: 11/02/2017 2:45 PM Medical Record Number: [id number] Patient Account Number: [id number] Date of Birth/Sex: 02-01-1929 (84 y.o. 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, VRE)o Yes Date: 10/19/2017 Have you tested positive for osteomyelitis (bone infection)o No Have you had any tests for circulation on your legso No Have you had other problems associated with your woundso Infection Eyes Medical History: Negative for: Cataracts; Glaucoma; Optic Neuritis Ear/Nose/Mouth/Throat Medical History: Negative for: Chronic sinus problems/congestion; Middle ear problems Hematologic/Lymphatic Medical History: Negative for: Anemia; Hemophilia; Human Immunodeficiency Virus; Lymphedema; Sickle Cell Disease Respiratory Medical History: Negative for: Aspiration; Asthma; Chronic Obstructive Pulmonary Disease (COPD); Pneumothorax; Sleep Apnea; Tuberculosis Cardiovascular Medical History: Positive for: Arrhythmia - a fib; Hypertension Negative for: Angina; Congestive Heart Failure; Coronary Artery Disease; Deep Vein Thrombosis; Hypotension; Myocardial Infarction; Peripheral Arterial Disease; Peripheral Venous Disease; Phlebitis; Vasculitis Gastrointestinal Medical History: Negative for: Cirrhosis ; Colitis; Crohnos; Hepatitis A; Hepatitis B; Hepatitis C Endocrine [name], [name] ([id number]) Medical History: Negative for: Type I Diabetes; Type II Diabetes Genitourinary Medical History: Negative for: End Stage Renal Disease Immunological Medical History: Negative for: Lupus Erythematosus; Raynaudos; Scleroderma Integumentary (Skin) Medical History: Negative for: History of Burn; History of pressure wounds Musculoskeletal Medical History: Negative for: Gout; Rheumatoid Arthritis; Osteoarthritis; Osteomyelitis Neurologic Medical History: Negative for: Dementia; Neuropathy; Quadriplegia; Paraplegia; Seizure Disorder Oncologic Medical History: Negative for:  Received Chemotherapy; Received Radiation Past Medical History Notes: multiple melanomas Psychiatric Medical History: Negative for: Anorexia/bulimia; Confinement Anxiety Immunizations Pneumococcal Vaccine: Received Pneumococcal Vaccination: Yes Implantable Devices Family and Social History Cancer: No; Heart Disease: Yes - Father,Mother; Hereditary Spherocytosis: No; Hypertension: Yes - Mother,Father; Kidney Disease: No; Lung Disease: No; Seizures: No; Stroke: No; Thyroid Problems: No; Tuberculosis: No; Former smoker - quit 50 years ago; Alcohol Use: Never; Drug Use: No History; Caffeine Use: Daily; Financial Concerns: No; Food, Clothing or Shelter Needs: No; Support System Lacking: No; Transportation Concerns: No; Advanced Directives: No; Patient does not want information on Advanced Directives Physician Affirmation I have reviewed and agree with the above information. Electronic Signature(s) Signed: 11/03/2017 6:54:27 AM By: Laretta Alstrom ([id number]) Signed: 11/03/2017 5:38:20 PM By: Alric Quan Entered By: Lawanda Cousins on 11/02/2017 15:03:32 [name], [name] ([id number]) -------------------------------------------------------------------------------- SuperBill Details Patient Name: [name] Date of Service: 11/02/2017 Medical Record Number: [id number] Patient Account Number: [id number] Date of Birth/Sex: 1928-07-01 (84 y.o. M) Treating RN: Ahmed Prima Primary Care Provider: Lavone Orn Other Clinician: Referring Provider: Lavone Orn Treating Provider/Extender: Cathie Olden in Treatment: 1 Diagnosis Coding ICD-10 Codes Code Description [phone] Non-pressure chronic ulcer of left calf with fat layer exposed S81.812S Laceration without foreign body, left lower leg, sequela Facility Procedures CPT4 Code: [id number] Description: 32951 - DEB SUBQ TISSUE 20 SQ CM/< ICD-10 Diagnosis Description L97.222 Non-pressure chronic ulcer of left calf with  fat layer expo S81.812S Laceration without foreign body, left lower leg, sequela Modifier: sed Quantity: 1 Physician Procedures CPT4 Code: [id number] Description: 63016 - WC PHYS SUBQ TISS 20 SQ CM ICD-10 Diagnosis Description L97.222 Non-pressure chronic ulcer of left calf with fat layer expo S81.812S Laceration without foreign body, left lower leg, sequela Modifier: sed Quantity: 1 Electronic Signature(s) Signed: 11/02/2017 3:04:01 PM By: Lawanda Cousins Entered By: Lawanda Cousins on 11/02/2017 15:04:00

## 2017-11-09 ENCOUNTER — Encounter: Payer: Medicare Other | Attending: Nurse Practitioner | Admitting: Nurse Practitioner

## 2017-11-09 DIAGNOSIS — Z85820 Personal history of malignant melanoma of skin: Secondary | ICD-10-CM | POA: Insufficient documentation

## 2017-11-09 DIAGNOSIS — Z87891 Personal history of nicotine dependence: Secondary | ICD-10-CM | POA: Diagnosis not present

## 2017-11-09 DIAGNOSIS — L97222 Non-pressure chronic ulcer of left calf with fat layer exposed: Secondary | ICD-10-CM | POA: Diagnosis present

## 2017-11-09 DIAGNOSIS — L97822 Non-pressure chronic ulcer of other part of left lower leg with fat layer exposed: Secondary | ICD-10-CM | POA: Diagnosis not present

## 2017-11-09 DIAGNOSIS — I10 Essential (primary) hypertension: Secondary | ICD-10-CM | POA: Diagnosis not present

## 2017-11-09 DIAGNOSIS — I4891 Unspecified atrial fibrillation: Secondary | ICD-10-CM | POA: Insufficient documentation

## 2017-11-10 NOTE — Progress Notes (Addendum)
ZERRICK, HANSSEN ([id number]) Visit Report for 11/09/2017 Arrival Information Details Patient Name: MARKO, SKALSKI Date of Service: 11/09/2017 2:00 PM Medical Record Number: [id number] Patient Account Number: [id number] Date of Birth/Sex: [birth year] (84 y.o. M) Treating RN: Secundino Ginger Primary Care Kamil Mchaffie: Lavone Orn Other Clinician: Referring Jillianne Gamino: Lavone Orn Treating Francee Setzer/Extender: Cathie Olden in Treatment: 2 Visit Information History Since Last Visit Added or deleted any medications: No Patient Arrived: Ambulatory Any new allergies or adverse reactions: No Arrival Time: 14:10 Had a fall or experienced change in No Accompanied By: self activities of daily living that may affect Transfer Assistance: None risk of falls: Patient Identification Verified: Yes Signs or symptoms of abuse/neglect since last visito No Secondary Verification Process Yes Hospitalized since last visit: No Completed: Implantable device outside of the clinic excluding No Patient Has Alerts: Yes cellular tissue based products placed in the center Patient Alerts: Patient on Blood since last visit: Thinner Has Dressing in Place as Prescribed: Yes Eliquis Pain Present Now: No Electronic Signature(s) Signed: 11/09/2017 2:49:54 PM By: Secundino Ginger Entered By: Secundino Ginger on 11/09/2017 14:11:23 [name] ([id number]) -------------------------------------------------------------------------------- Encounter Discharge Information Details Patient Name: [name] Date of Service: 11/09/2017 2:00 PM Medical Record Number: [id number] Patient Account Number: [id number] Date of Birth/Sex: 1928-08-30 (84 y.o. M) Treating RN: Ahmed Prima Primary Care Samarah Hogle: Lavone Orn Other Clinician: Referring Kelso Bibby: Lavone Orn Treating Neera Teng/Extender: Cathie Olden in Treatment: 2 Encounter Discharge Information Items Discharge Condition: Stable Ambulatory Status: Ambulatory Discharge  Destination: Home Transportation: Private Auto Accompanied By: self Schedule Follow-up Appointment: Yes Clinical Summary of Care: Provided Form Type Recipient Paper Patient gs Electronic Signature(s) Signed: 11/09/2017 4:25:44 PM By: Alric Quan Previous Signature: 11/09/2017 2:34:21 PM Version By: Lorine Bears RCP, RRT, CHT Entered By: Alric Quan on 11/09/2017 15:02:51 [name] ([id number]) -------------------------------------------------------------------------------- Lower Extremity Assessment Details Patient Name: [name] Date of Service: 11/09/2017 2:00 PM Medical Record Number: [id number] Patient Account Number: [id number] Date of Birth/Sex: [birth year] (84 y.o. M) Treating RN: Secundino Ginger Primary Care Velmer Woelfel: Lavone Orn Other Clinician: Referring Peightyn Roberson: Lavone Orn Treating Laiylah Roettger/Extender: Cathie Olden in Treatment: 2 Edema Assessment Assessed: [Left: No] [Right: No] [Left: Edema] [Right: :] Calf Left: Right: Point of Measurement: 34 cm From Medial Instep 32 cm cm Ankle Left: Right: Point of Measurement: 12 cm From Medial Instep 21 cm cm Vascular Assessment Claudication: Claudication Assessment [Left:None] Pulses: Dorsalis Pedis Palpable: [Left:Yes] Posterior Tibial Extremity colors, hair growth, and conditions: Extremity Color: [Left:Hyperpigmented] Hair Growth on Extremity: [Left:No] Temperature of Extremity: [Left:Warm] Capillary Refill: [Left:< 3 seconds] Toe Nail Assessment Left: Right: Thick: Yes Discolored: Yes Deformed: No Improper Length and Hygiene: No Electronic Signature(s) Signed: 11/09/2017 2:49:54 PM By: Secundino Ginger Entered By: Secundino Ginger on 11/09/2017 14:15:43 [name] ([id number]) -------------------------------------------------------------------------------- Multi Wound Chart Details Patient Name: [name] Date of Service: 11/09/2017 2:00 PM Medical Record Number:  [id number] Patient Account Number: [id number] Date of Birth/Sex: 09-Oct-1928 (84 y.o. M) Treating RN: Ahmed Prima Primary Care Elysia Grand: Lavone Orn Other Clinician: Referring Mackinley Cassaday: Lavone Orn Treating Goku Harb/Extender: Cathie Olden in Treatment: 2 Vital Signs Height(in): 50 Pulse(bpm): 24 Weight(lbs): 170 Blood Pressure(mmHg): 108/76 Body Mass Index(BMI): 25 Temperature(F): 98.1 Respiratory Rate 16 (breaths/min): Photos: [1:No Photos] [N/A:N/A] Wound Location: [1:Left Lower Leg - Anterior] [N/A:N/A] Wounding Event: [1:Trauma] [N/A:N/A] Primary Etiology: [1:Trauma, Other] [N/A:N/A] Comorbid History: [1:Arrhythmia, Hypertension] [N/A:N/A] Date Acquired: [1:10/05/2017] [N/A:N/A] Weeks of Treatment: [1:2] [N/A:N/A] Wound Status: [1:Open] [N/A:N/A] Measurements L x W x D [1:1.8x1.6x0.2] [N/A:N/A] (cm) Area (cm) : [1:2.262] [  N/A:N/A] Volume (cm) : [1:0.452] [N/A:N/A] % Reduction in Area: [1:73.60%] [N/A:N/A] % Reduction in Volume: [1:73.60%] [N/A:N/A] Classification: [1:Full Thickness Without Exposed Support Structures] [N/A:N/A] Exudate Amount: [1:Large] [N/A:N/A] Exudate Type: [1:Serous] [N/A:N/A] Exudate Color: [1:amber] [N/A:N/A] Wound Margin: [1:Flat and Intact] [N/A:N/A] Granulation Amount: [1:Large (67-100%)] [N/A:N/A] Granulation Quality: [1:Red] [N/A:N/A] Necrotic Amount: [1:Small (1-33%)] [N/A:N/A] Exposed Structures: [1:Fat Layer (Subcutaneous Tissue) Exposed: Yes Fascia: No Tendon: No Muscle: No Joint: No Bone: No] [N/A:N/A] Epithelialization: [1:None] [N/A:N/A] Debridement: [1:Debridement - Excisional] [N/A:N/A] Pre-procedure [1:14:23] [N/A:N/A] Verification/Time Out Taken: Pain Control: [1:Lidocaine 4% Topical Solution] [N/A:N/A] Tissue Debrided: [1:Subcutaneous, Slough] [N/A:N/A] Level: [1:Skin/Subcutaneous Tissue] [N/A:N/A] Debridement Area (sq cm): 2.88 N/A N/A Instrument: Curette N/A N/A Bleeding: Minimum N/A N/A Hemostasis  Achieved: Pressure N/A N/A Procedural Pain: 0 N/A N/A Post Procedural Pain: 0 N/A N/A Debridement Treatment Procedure was tolerated well N/A N/A Response: Post Debridement 1.8x1.6x0.3 N/A N/A Measurements L x W x D (cm) Post Debridement Volume: 0.679 N/A N/A (cm) Periwound Skin Texture: Excoriation: No N/A N/A Induration: No Callus: No Crepitus: No Rash: No Scarring: No Periwound Skin Moisture: Maceration: Yes N/A N/A Dry/Scaly: No Periwound Skin Color: Erythema: Yes N/A N/A Atrophie Blanche: No Cyanosis: No Ecchymosis: No Hemosiderin Staining: No Mottled: No Pallor: No Rubor: No Temperature: No Abnormality N/A N/A Tenderness on Palpation: No N/A N/A Wound Preparation: Ulcer Cleansing: N/A N/A Rinsed/Irrigated with Saline Topical Anesthetic Applied: Other: lidocaine 4% Procedures Performed: Debridement N/A N/A Treatment Notes Electronic Signature(s) Signed: 11/09/2017 2:53:01 PM By: Lawanda Cousins Previous Signature: 11/09/2017 2:43:37 PM Version By: Alric Quan Entered By: Lawanda Cousins on 11/09/2017 14:53:01 [name] ([id number]) -------------------------------------------------------------------------------- Multi-Disciplinary Care Plan Details Patient Name: [name] Date of Service: 11/09/2017 2:00 PM Medical Record Number: [id number] Patient Account Number: [id number] Date of Birth/Sex: 05/12/1929 (84 y.o. M) Treating RN: Ahmed Prima Primary Care Griffyn Kucinski: Lavone Orn Other Clinician: Referring Smera Guyette: Lavone Orn Treating Murel Shenberger/Extender: Cathie Olden in Treatment: 2 Active Inactive ` Orientation to the Wound Care Program Nursing Diagnoses: Knowledge deficit related to the wound healing center program Goals: Patient/caregiver will verbalize understanding of the [place] Program Date Initiated: 10/26/2017 Target Resolution Date: 11/16/2017 Goal Status: Active Interventions: Provide education on orientation to  the wound center Notes: ` Wound/Skin Impairment Nursing Diagnoses: Impaired tissue integrity Goals: Patient/caregiver will verbalize understanding of skin care regimen Date Initiated: 10/26/2017 Target Resolution Date: 11/16/2017 Goal Status: Active Ulcer/skin breakdown will have a volume reduction of 30% by week 4 Date Initiated: 10/26/2017 Target Resolution Date: 11/16/2017 Goal Status: Active Interventions: Assess patient/caregiver ability to obtain necessary supplies Assess patient/caregiver ability to perform ulcer/skin care regimen upon admission and as needed Assess ulceration(s) every visit Treatment Activities: Skin care regimen initiated : 10/26/2017 Notes: Electronic Signature(s) Signed: 11/09/2017 2:43:37 PM By: Merton Border, Juliann Pulse ([id number]) Entered By: Alric Quan on 11/09/2017 14:23:45 [name] ([id number]) -------------------------------------------------------------------------------- Pain Assessment Details Patient Name: [name] Date of Service: 11/09/2017 2:00 PM Medical Record Number: [id number] Patient Account Number: [id number] Date of Birth/Sex: [birth year] (84 y.o. M) Treating RN: Secundino Ginger Primary Care Dally Oshel: Lavone Orn Other Clinician: Referring Lyncoln Ledgerwood: Lavone Orn Treating Azilee Pirro/Extender: Cathie Olden in Treatment: 2 Active Problems Location of Pain Severity and Description of Pain Patient Has Paino No Site Locations Pain Management and Medication Current Pain Management: Electronic Signature(s) Signed: 11/09/2017 2:49:54 PM By: Secundino Ginger Entered By: Secundino Ginger on 11/09/2017 14:11:36 [name] ([id number]) -------------------------------------------------------------------------------- Patient/Caregiver Education Details Patient Name: [name] Date of Service: 11/09/2017 2:00 PM Medical Record Number: [id number] Patient  Account Number: [id number] Date of Birth/Gender: 09-01-1928 (84 y.o.  M) Treating RN: Ahmed Prima Primary Care Physician: Lavone Orn Other Clinician: Referring Physician: Lavone Orn Treating Physician/Extender: Cathie Olden in Treatment: 2 Education Assessment Education Provided To: Patient Education Topics Provided Wound/Skin Impairment: Handouts: Caring for Your Ulcer, Skin Care Do's and Dont's, Other: change dressing as ordered Methods: Demonstration, Explain/Verbal Responses: State content correctly Electronic Signature(s) Signed: 11/09/2017 4:25:44 PM By: Alric Quan Entered By: Alric Quan on 11/09/2017 15:03:06 [name] ([id number]) -------------------------------------------------------------------------------- Wound Assessment Details Patient Name: [name] Date of Service: 11/09/2017 2:00 PM Medical Record Number: [id number] Patient Account Number: [id number] Date of Birth/Sex: [birth year] (84 y.o. M) Treating RN: Secundino Ginger Primary Care Binta Statzer: Lavone Orn Other Clinician: Referring Monserrate Blaschke: Lavone Orn Treating Jivan Symanski/Extender: Lawanda Cousins Weeks in Treatment: 2 Wound Status Wound Number: 1 Primary Etiology: Trauma, Other Wound Location: Left Lower Leg - Anterior Wound Status: Open Wounding Event: Trauma Comorbid History: Arrhythmia, Hypertension Date Acquired: 10/05/2017 Weeks Of Treatment: 2 Clustered Wound: No Photos Photo Uploaded By: Montey Hora on 11/09/2017 16:02:32 Wound Measurements Length: (cm) 1.8 Width: (cm) 1.6 Depth: (cm) 0.2 Area: (cm) 2.262 Volume: (cm) 0.452 % Reduction in Area: 73.6% % Reduction in Volume: 73.6% Epithelialization: None Tunneling: No Undermining: No Wound Description Full Thickness Without Exposed Support Classification: Structures Wound Margin: Flat and Intact Exudate Large Amount: Exudate Type: Serous Exudate Color: amber Foul Odor After Cleansing: No Slough/Fibrino Yes Wound Bed Granulation Amount: Large (67-100%) Exposed  Structure Granulation Quality: Red Fascia Exposed: No Necrotic Amount: Small (1-33%) Fat Layer (Subcutaneous Tissue) Exposed: Yes Necrotic Quality: Adherent Slough Tendon Exposed: No Muscle Exposed: No Joint Exposed: No Bone Exposed: No Ellerbe, Willow ([id number]) Periwound Skin Texture Texture Color No Abnormalities Noted: No No Abnormalities Noted: No Callus: No Atrophie Blanche: No Crepitus: No Cyanosis: No Excoriation: No Ecchymosis: No Induration: No Erythema: Yes Rash: No Hemosiderin Staining: No Scarring: No Mottled: No Pallor: No Moisture Rubor: No No Abnormalities Noted: No Dry / Scaly: No Temperature / Pain Maceration: Yes Temperature: No Abnormality Wound Preparation Ulcer Cleansing: Rinsed/Irrigated with Saline Topical Anesthetic Applied: Other: lidocaine 4%, Treatment Notes Wound #1 (Left, Anterior Lower Leg) 1. Cleansed with: Clean wound with Normal Saline 2. Anesthetic Topical Lidocaine 4% cream to wound bed prior to debridement 4. Dressing Applied: Promogran 5. Secondary Dressing Applied Bordered Foam Dressing Electronic Signature(s) Signed: 11/09/2017 2:49:54 PM By: Secundino Ginger Entered By: Secundino Ginger on 11/09/2017 14:17:06 [name] ([id number]) -------------------------------------------------------------------------------- Vitals Details Patient Name: [name] Date of Service: 11/09/2017 2:00 PM Medical Record Number: [id number] Patient Account Number: [id number] Date of Birth/Sex: Dec 04, 1928 (84 y.o. M) Treating RN: Secundino Ginger Primary Care Jadian Karman: Lavone Orn Other Clinician: Referring Arlin Savona: Lavone Orn Treating Haani Bakula/Extender: Cathie Olden in Treatment: 2 Vital Signs Time Taken: 14:00 Temperature (F): 98.1 Height (in): 69 Pulse (bpm): 66 Weight (lbs): 170 Respiratory Rate (breaths/min): 16 Body Mass Index (BMI): 25.1 Blood Pressure (mmHg): 108/76 Reference Range: 80 - 120 mg / dl Electronic  Signature(s) Signed: 11/09/2017 2:49:54 PM By: Secundino Ginger Entered By: Secundino Ginger on 11/09/2017 14:12:56

## 2017-11-10 NOTE — Progress Notes (Signed)
BRADDEN, TADROS ([id number]) Visit Report for 11/09/2017 Chief Complaint Document Details Patient Name: [name], [name] Date of Service: 11/09/2017 2:00 PM Medical Record Number: [id number] Patient Account Number: [id number] Date of Birth/Sex: Aug 05, 1928 (84 y.o. M) Treating RN: Ahmed Prima Primary Care Provider: Lavone Orn Other Clinician: Referring Provider: Lavone Orn Treating Provider/Extender: Cathie Olden in Treatment: 2 Information Obtained from: Patient Chief Complaint He is here for left lower extremity wound Electronic Signature(s) Signed: 11/09/2017 2:54:20 PM By: Lawanda Cousins Entered By: Lawanda Cousins on 11/09/2017 14:54:19 [name] ([id number]) -------------------------------------------------------------------------------- Debridement Details Patient Name: [name] Date of Service: 11/09/2017 2:00 PM Medical Record Number: [id number] Patient Account Number: [id number] Date of Birth/Sex: [birth year] (84 y.o. M) Treating RN: Ahmed Prima Primary Care Provider: Lavone Orn Other Clinician: Referring Provider: Lavone Orn Treating Provider/Extender: Cathie Olden in Treatment: 2 Debridement Performed for Wound #1 Left,Anterior Lower Leg Assessment: Performed By: Physician Lawanda Cousins, NP Debridement Type: Debridement Pre-procedure Verification/Time Yes - 14:23 Out Taken: Start Time: 14:23 Pain Control: Lidocaine 4% Topical Solution Total Area Debrided (L x W): 1.8 (cm) x 1.6 (cm) = 2.88 (cm) Tissue and other material Viable, Non-Viable, Slough, Subcutaneous, Fibrin/Exudate, Slough debrided: Level: Skin/Subcutaneous Tissue Debridement Description: Excisional Instrument: Curette Bleeding: Minimum Hemostasis Achieved: Pressure End Time: 14:25 Procedural Pain: 0 Post Procedural Pain: 0 Response to Treatment: Procedure was tolerated well Level of Consciousness: Awake and Alert Post Procedure Vitals: Temperature: 98.1 Pulse:  66 Respiratory Rate: 16 Blood Pressure: Systolic Blood Pressure: 124 Diastolic Blood Pressure: 76 Post Debridement Measurements of Total Wound Length: (cm) 1.8 Width: (cm) 1.6 Depth: (cm) 0.3 Volume: (cm) 0.679 Character of Wound/Ulcer Post Debridement: Requires Further Debridement Post Procedure Diagnosis Same as Pre-procedure Electronic Signature(s) Signed: 11/09/2017 2:43:37 PM By: Alric Quan Signed: 11/09/2017 4:23:13 PM By: Lawanda Cousins Entered By: Alric Quan on 11/09/2017 14:25:09 [name] ([id number]) -------------------------------------------------------------------------------- HPI Details Patient Name: [name] Date of Service: 11/09/2017 2:00 PM Medical Record Number: [id number] Patient Account Number: [id number] Date of Birth/Sex: 17-Apr-1929 (84 y.o. M) Treating RN: Ahmed Prima Primary Care Provider: Lavone Orn Other Clinician: Referring Provider: Lavone Orn Treating Provider/Extender: Cathie Olden in Treatment: 2 History of Present Illness HPI Description: 10/26/17-He is here for an initial evaluation for left lower extremity traumatic injury. He states that on approximate 3 weeks ago he injured his leg with a step stool while cleaning his car. He saw Dr. Deforest Hoyles at [facility] on 5/15 who prescribed doxycycline; he had a follow-up on 5/20 when he was referred to the wound clinic and instructed to continue doxycycline and Neosporin. He denies pain, he denies edema. He is nondiabetic and a former smoker, quit in 1968. He does wear compression stockings/wraps. 11/02/17-He is here for follow-up evaluation of the left lower extremity ulcer. There is significant improvement in both measurements and appearance. He completed doxycycline on Saturday. We will change to [facility] and he will follow-up next week 11/09/17- He is here for follow up evaluation of the left lower extremity ulcer. He continues to progress, we will continue  with same treatment plan and he will follow up next week Electronic Signature(s) Signed: 11/09/2017 2:55:21 PM By: Lawanda Cousins Entered By: Lawanda Cousins on 11/09/2017 14:55:21 [name] ([id number]) -------------------------------------------------------------------------------- Physician Orders Details Patient Name: [name] Date of Service: 11/09/2017 2:00 PM Medical Record Number: [id number] Patient Account Number: [id number] Date of Birth/Sex: 02-May-1929 (84 y.o. M) Treating RN: Ahmed Prima Primary Care Provider: Lavone Orn Other Clinician: Referring Provider: Lavone Orn Treating Provider/Extender: Cathie Olden in  Treatment: 2 Verbal / Phone Orders: Yes Clinician: Pinkerton, Debi Read Back and Verified: Yes Diagnosis Coding Wound Cleansing Wound #1 Left,Anterior Lower Leg o Clean wound with Normal Saline. o Cleanse wound with mild soap and water Anesthetic (add to Medication List) Wound #1 Left,Anterior Lower Leg o Topical Lidocaine 4% cream applied to wound bed prior to debridement (In Clinic Only). Skin Barriers/Peri-Wound Care Wound #1 Left,Anterior Lower Leg o Skin Prep Primary Wound Dressing Wound #1 Left,Anterior Lower Leg o Collagen Secondary Dressing Wound #1 Left,Anterior Lower Leg o Boardered Foam Dressing Dressing Change Frequency Wound #1 Left,Anterior Lower Leg o Dressing is to be changed Monday and Thursday. Follow-up Appointments Wound #1 Left,Anterior Lower Leg o Return Appointment in 1 week. Edema Control o Elevate legs to the level of the heart and pump ankles as often as possible Additional Orders / Instructions Wound #1 Left,Anterior Lower Leg o Increase protein intake. Patient Medications Allergies: Septra Notifications Medication Indication Start End lidocaine Bernardini, [place] ([id number]) Notifications Medication Indication Start End DOSE 1 - topical 4 % cream - 1 cream topical Electronic  Signature(s) Signed: 11/09/2017 2:43:37 PM By: Alric Quan Signed: 11/09/2017 4:23:13 PM By: Lawanda Cousins Entered By: Alric Quan on 11/09/2017 14:27:11 CONSTANTINOS, KREMPASKY ([id number]) -------------------------------------------------------------------------------- Prescription 11/09/2017 Patient Name: [name] Provider: Lawanda Cousins NP Date of Birth: 05/07/1929 NPI#: [id number] Sex: M DEA#: [id number] Phone #: [phone] License #: Patient Address: [facility] [place], [place] [address] [address] [place], [place] [address] [phone] Allergies Septra Medication Medication: Route: Strength: Form: lidocaine topical 4% cream Class: TOPICAL LOCAL ANESTHETICS Dose: Frequency / Time: Indication: 1 1 cream topical Number of Refills: Number of Units: 0 Generic Substitution: Start Date: End Date: Administered at [facility]: Yes Time Administered: Time Discontinued: Note to Pharmacy: Signature(s): Date(s): Electronic Signature(s) Signed: 11/09/2017 2:43:37 PM By: Alric Quan Signed: 11/09/2017 4:23:13 PM By: Lawanda Cousins Entered By: Alric Quan on 11/09/2017 14:27:12 [name] ([id number]) Duanne Limerick, Juliann Pulse ([id number]) --------------------------------------------------------------------------------  Problem List Details Patient Name: [name] Date of Service: 11/09/2017 2:00 PM Medical Record Number: [id number] Patient Account Number: [id number] Date of Birth/Sex: 12-10-1928 (84 y.o. M) Treating RN: Ahmed Prima Primary Care Provider: Lavone Orn Other Clinician: Referring Provider: Lavone Orn Treating Provider/Extender: Cathie Olden in Treatment: 2 Active Problems ICD-10 Impacting Encounter Code Description Active Date Wound Healing Diagnosis L97.222 Non-pressure chronic ulcer of left calf with fat layer  exposed 10/26/2017 No Yes S81.812S Laceration without foreign body, left lower leg, sequela 10/26/2017 No Yes Inactive Problems Resolved Problems Electronic Signature(s) Signed: 11/09/2017 2:52:53 PM By: Lawanda Cousins Entered By: Lawanda Cousins on 11/09/2017 14:52:52 [name] ([id number]) -------------------------------------------------------------------------------- Progress Note Details Patient Name: [name] Date of Service: 11/09/2017 2:00 PM Medical Record Number: [id number] Patient Account Number: [id number] Date of Birth/Sex: 1928/11/12 (84 y.o. M) Treating RN: Ahmed Prima Primary Care Provider: Lavone Orn Other Clinician: Referring Provider: Lavone Orn Treating Provider/Extender: Cathie Olden in Treatment: 2 Subjective Chief Complaint Information obtained from Patient He is here for left lower extremity wound History of Present Illness (HPI) 10/26/17-He is here for an initial evaluation for left lower extremity traumatic injury. He states that on approximate 3 weeks ago he injured his leg with a step stool while cleaning his car. He saw Dr. Deforest Hoyles at [facility] on 5/15 who prescribed doxycycline; he had a follow-up on 5/20 when he was referred to the wound clinic and instructed to continue doxycycline and Neosporin.  He denies pain, he denies edema. He is nondiabetic and a former smoker, quit in 1968. He does wear compression stockings/wraps. 11/02/17-He is here for follow-up evaluation of the left lower extremity ulcer. There is significant improvement in both measurements and appearance. He completed doxycycline on Saturday. We will change to [facility] and he will follow-up next week 11/09/17- He is here for follow up evaluation of the left lower extremity ulcer. He continues to progress, we will continue with same treatment plan and he will follow up next week Patient History Information obtained from Patient. Family History Heart Disease -  Father,Mother, Hypertension - Mother,Father, No family history of Cancer, Hereditary Spherocytosis, Kidney Disease, Lung Disease, Seizures, Stroke, Thyroid Problems, Tuberculosis. Social History Former smoker - quit 50 years ago, Alcohol Use - Never, Drug Use - No History, Caffeine Use - Daily. Medical And Surgical History Notes Oncologic multiple melanomas Objective Constitutional Vitals Time Taken: 2:00 PM, Height: 69 in, Weight: 170 lbs, BMI: 25.1, Temperature: 98.1 F, Pulse: 66 bpm, Respiratory Aronoff, Corben ([id number]) Rate: 16 breaths/min, Blood Pressure: 108/76 mmHg. Integumentary (Hair, Skin) Wound #1 status is Open. Original cause of wound was Trauma. The wound is located on the Left,Anterior Lower Leg. The wound measures 1.8cm length x 1.6cm width x 0.2cm depth; 2.262cm^2 area and 0.452cm^3 volume. There is Fat Layer (Subcutaneous Tissue) Exposed exposed. There is no tunneling or undermining noted. There is a large amount of serous drainage noted. The wound margin is flat and intact. There is large (67-100%) red granulation within the wound bed. There is a small (1-33%) amount of necrotic tissue within the wound bed including Adherent Slough. The periwound skin appearance exhibited: Maceration, Erythema. The periwound skin appearance did not exhibit: Callus, Crepitus, Excoriation, Induration, Rash, Scarring, Dry/Scaly, Atrophie Blanche, Cyanosis, Ecchymosis, Hemosiderin Staining, Mottled, Pallor, Rubor. The surrounding wound skin color is noted with erythema. Periwound temperature was noted as No Abnormality. Assessment Active Problems ICD-10 Non-pressure chronic ulcer of left calf with fat layer exposed Laceration without foreign body, left lower leg, sequela Procedures Wound #1 Pre-procedure diagnosis of Wound #1 is a Trauma, Other located on the Left,Anterior Lower Leg . There was a Excisional Skin/Subcutaneous Tissue Debridement with a total area of 2.88 sq cm performed  by Lawanda Cousins, NP. With the following instrument(s): Curette to remove Viable and Non-Viable tissue/material. Material removed includes Subcutaneous Tissue, Slough, and Fibrin/Exudate after achieving pain control using Lidocaine 4% Topical Solution. No specimens were taken. A time out was conducted at 14:23, prior to the start of the procedure. A Minimum amount of bleeding was controlled with Pressure. The procedure was tolerated well with a pain level of 0 throughout and a pain level of 0 following the procedure. Patient s Level of Consciousness post procedure was recorded as Awake and Alert. Post Debridement Measurements: 1.8cm length x 1.6cm width x 0.3cm depth; 0.679cm^3 volume. Character of Wound/Ulcer Post Debridement requires further debridement. Post procedure Diagnosis Wound #1: Same as Pre-Procedure Plan Wound Cleansing: Wound #1 Left,Anterior Lower Leg: Clean wound with Normal Saline. Cleanse wound with mild soap and water Anesthetic (add to Medication List): Wound #1 Left,Anterior Lower Leg: Topical Lidocaine 4% cream applied to wound bed prior to debridement (In Clinic Only). Skin Barriers/Peri-Wound Care: GUNTHER, ZAWADZKI ([id number]) Wound #1 Left,Anterior Lower Leg: Skin Prep Primary Wound Dressing: Wound #1 Left,Anterior Lower Leg: Collagen Secondary Dressing: Wound #1 Left,Anterior Lower Leg: Boardered Foam Dressing Dressing Change Frequency: Wound #1 Left,Anterior Lower Leg: Dressing is to be changed Monday and Thursday. Follow-up Appointments:  Wound #1 Left,Anterior Lower Leg: Return Appointment in 1 week. Edema Control: Elevate legs to the level of the heart and pump ankles as often as possible Additional Orders / Instructions: Wound #1 Left,Anterior Lower Leg: Increase protein intake. The following medication(s) was prescribed: lidocaine topical 4 % cream 1 1 cream topical was prescribed at facility Electronic Signature(s) Signed: 11/09/2017 2:55:47 PM By:  Lawanda Cousins Entered By: Lawanda Cousins on 11/09/2017 14:55:46 [name] ([id number]) -------------------------------------------------------------------------------- ROS/PFSH Details Patient Name: [name] Date of Service: 11/09/2017 2:00 PM Medical Record Number: [id number] Patient Account Number: [id number] Date of Birth/Sex: 08-02-1928 (84 y.o. 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, VRE)o Yes Date: 10/19/2017 Have you tested positive for osteomyelitis (bone infection)o No Have you had any tests for circulation on your legso No Have you had other problems associated with your woundso Infection Eyes Medical History: Negative for: Cataracts; Glaucoma; Optic Neuritis Ear/Nose/Mouth/Throat Medical History: Negative for: Chronic sinus problems/congestion; Middle ear problems Hematologic/Lymphatic Medical History: Negative for: Anemia; Hemophilia; Human Immunodeficiency Virus; Lymphedema; Sickle Cell Disease Respiratory Medical History: Negative for: Aspiration; Asthma; Chronic Obstructive Pulmonary Disease (COPD); Pneumothorax; Sleep Apnea; Tuberculosis Cardiovascular Medical History: Positive for: Arrhythmia - a fib; Hypertension Negative for: Angina; Congestive Heart Failure; Coronary Artery Disease; Deep Vein Thrombosis; Hypotension; Myocardial Infarction; Peripheral Arterial Disease;  Peripheral Venous Disease; Phlebitis; Vasculitis Gastrointestinal Medical History: Negative for: Cirrhosis ; Colitis; Crohnos; Hepatitis A; Hepatitis B; Hepatitis C Endocrine DERRIEN, ANSCHUTZ ([id number]) Medical History: Negative for: Type I Diabetes; Type II Diabetes Genitourinary Medical History: Negative for: End Stage Renal Disease Immunological Medical History: Negative for: Lupus Erythematosus; Raynaudos; Scleroderma Integumentary (Skin) Medical History: Negative for: History of Burn; History of pressure wounds Musculoskeletal Medical History: Negative for: Gout; Rheumatoid Arthritis; Osteoarthritis; Osteomyelitis Neurologic Medical History: Negative for: Dementia; Neuropathy; Quadriplegia; Paraplegia; Seizure Disorder Oncologic Medical History: Negative for: Received Chemotherapy; Received Radiation Past Medical History Notes: multiple melanomas Psychiatric Medical History: Negative for: Anorexia/bulimia; Confinement Anxiety Immunizations Pneumococcal Vaccine: Received Pneumococcal Vaccination: Yes Implantable Devices Family and Social History Cancer: No; Heart Disease: Yes - Father,Mother; Hereditary Spherocytosis: No; Hypertension: Yes - Mother,Father; Kidney Disease: No; Lung Disease: No; Seizures: No; Stroke: No; Thyroid Problems: No; Tuberculosis: No; Former smoker - quit 50 years ago; Alcohol Use: Never; Drug Use: No History; Caffeine Use: Daily; Financial Concerns: No; Food, Clothing or Shelter Needs: No; Support System Lacking: No; Transportation Concerns: No; Advanced Directives: No; Patient does not want information on Advanced Directives Physician Affirmation I have reviewed and agree with the above information. Electronic Signature(s) Signed: 11/09/2017 4:23:13 PM By: Laretta Alstrom ([id number]) Signed: 11/09/2017 4:25:44 PM By: Alric Quan Entered By: Lawanda Cousins on 11/09/2017 14:55:29 [name]  ([id number]) -------------------------------------------------------------------------------- SuperBill Details Patient Name: [name] Date of Service: 11/09/2017 Medical Record Number: [id number] Patient Account Number: [id number] Date of Birth/Sex: 01-05-1929 (84 y.o. M) Treating RN: Ahmed Prima Primary Care Provider: Lavone Orn Other Clinician: Referring Provider: Lavone Orn Treating Provider/Extender: Cathie Olden in  Treatment: 2 Diagnosis Coding ICD-10 Codes Code Description [phone] Non-pressure chronic ulcer of left calf with fat layer exposed S81.812S Laceration without foreign body, left lower leg, sequela Facility Procedures CPT4 Code: [id number] Description: 60029 - DEB SUBQ TISSUE 20 SQ CM/< ICD-10 Diagnosis Description L97.222 Non-pressure chronic ulcer of left calf with fat layer expo S81.812S Laceration without foreign body, left lower leg, sequela Modifier: sed Quantity: 1 Physician Procedures CPT4 Code: [id number] Description: 69437 - WC PHYS SUBQ TISS 20 SQ CM ICD-10 Diagnosis Description L97.222 Non-pressure chronic ulcer of left calf with fat layer expo S81.812S Laceration without foreign body, left lower leg, sequela Modifier: sed Quantity: 1 Electronic Signature(s) Signed: 11/09/2017 2:55:58 PM By: Lawanda Cousins Entered By: Lawanda Cousins on 11/09/2017 14:55:57

## 2017-11-16 ENCOUNTER — Encounter: Payer: Medicare Other | Admitting: Nurse Practitioner

## 2017-11-16 DIAGNOSIS — L97822 Non-pressure chronic ulcer of other part of left lower leg with fat layer exposed: Secondary | ICD-10-CM | POA: Diagnosis not present

## 2017-11-16 DIAGNOSIS — L97222 Non-pressure chronic ulcer of left calf with fat layer exposed: Secondary | ICD-10-CM | POA: Diagnosis not present

## 2017-11-19 NOTE — Progress Notes (Signed)
[name] ([id number]) Visit Report for 11/16/2017 Arrival Information Details Patient Name: [name] Date of Service: 11/16/2017 8:00 AM Medical Record Number: [id number] Patient Account Number: [id number] Date of Birth/Sex: 1929-04-28 (84 y.o. M) Treating RN: Roger Shelter Primary Care Daemon Dowty: Lavone Orn Other Clinician: Referring Yaelis Scharfenberg: Lavone Orn Treating Adriel Desrosier/Extender: Cathie Olden in Treatment: 3 Visit Information History Since Last Visit All ordered tests and consults were completed: No Patient Arrived: Ambulatory Added or deleted any medications: No Arrival Time: 08:08 Any new allergies or adverse reactions: No Accompanied By: self Had a fall or experienced change in No Transfer Assistance: None activities of daily living that may affect Patient Identification Verified: Yes risk of falls: Secondary Verification Process Yes Signs or symptoms of abuse/neglect since last visito No Completed: Hospitalized since last visit: No Patient Has Alerts: Yes Implantable device outside of the clinic excluding No Patient Alerts: Patient on Blood cellular tissue based products placed in the center Thinner since last visit: Eliquis Pain Present Now: No Electronic Signature(s) Signed: 11/16/2017 9:30:03 AM By: Roger Shelter Entered By: Roger Shelter on 11/16/2017 08:08:39 [name] ([id number]) -------------------------------------------------------------------------------- Encounter Discharge Information Details Patient Name: [name] Date of Service: 11/16/2017 8:00 AM Medical Record Number: [id number] Patient Account Number: [id number] Date of Birth/Sex: 1929/03/15 (84 y.o. M) Treating RN: Ahmed Prima Primary Care Gitty Osterlund: Lavone Orn Other Clinician: Referring Merlon Alcorta: Lavone Orn Treating Nicolis Boody/Extender: Cathie Olden in Treatment: 3 Encounter Discharge Information Items Discharge Condition:  Stable Ambulatory Status: Ambulatory Discharge Destination: Home Transportation: Private Auto Accompanied By: self Schedule Follow-up Appointment: Yes Clinical Summary of Care: Electronic Signature(s) Signed: 11/16/2017 3:04:13 PM By: Alric Quan Entered By: Alric Quan on 11/16/2017 08:27:59 [name] ([id number]) -------------------------------------------------------------------------------- Lower Extremity Assessment Details Patient Name: [name] Date of Service: 11/16/2017 8:00 AM Medical Record Number: [id number] Patient Account Number: [id number] Date of Birth/Sex: 1928-06-27 (84 y.o. M) Treating RN: Roger Shelter Primary Care Ayce Pietrzyk: Lavone Orn Other Clinician: Referring Mariajose Mow: Lavone Orn Treating Sicilia Killough/Extender: Cathie Olden in Treatment: 3 Edema Assessment Assessed: [Left: No] [Right: No] Edema: [Left: N] [Right: o] Calf Left: Right: Point of Measurement: 34 cm From Medial Instep 33 cm cm Ankle Left: Right: Point of Measurement: 12 cm From Medial Instep 21.3 cm cm Vascular Assessment Claudication: Claudication Assessment [Left:None] Pulses: Dorsalis Pedis Palpable: [Left:Yes] Posterior Tibial Extremity colors, hair growth, and conditions: Extremity Color: [Left:Hyperpigmented] Hair Growth on Extremity: [Left:Yes] Temperature of Extremity: [Left:Warm] Capillary Refill: [Left:< 3 seconds] Toe Nail Assessment Left: Right: Thick: Yes Discolored: No Deformed: No Improper Length and Hygiene: No Electronic Signature(s) Signed: 11/16/2017 9:30:03 AM By: Roger Shelter Entered By: Roger Shelter on 11/16/2017 08:16:56 [name] ([id number]) -------------------------------------------------------------------------------- Multi Wound Chart Details Patient Name: [name] Date of Service: 11/16/2017 8:00 AM Medical Record Number: [id number] Patient Account Number: [id number] Date of Birth/Sex: January 04, 1929 (84  y.o. M) Treating RN: Ahmed Prima Primary Care Jo-Ann Johanning: Lavone Orn Other Clinician: Referring Lytle Malburg: Lavone Orn Treating Yeray Tomas/Extender: Cathie Olden in Treatment: 3 Vital Signs Height(in): 69 Pulse(bpm): 93 Weight(lbs): 170 Blood Pressure(mmHg): 145/62 Body Mass Index(BMI): 25 Temperature(F): 97.7 Respiratory Rate 16 (breaths/min): Photos: [1:No Photos] [N/A:N/A] Wound Location: [1:Left Lower Leg - Anterior] [N/A:N/A] Wounding Event: [1:Trauma] [N/A:N/A] Primary Etiology: [1:Trauma, Other] [N/A:N/A] Comorbid History: [1:Arrhythmia, Hypertension] [N/A:N/A] Date Acquired: [1:10/05/2017] [N/A:N/A] Weeks of Treatment: [1:3] [N/A:N/A] Wound Status: [1:Open] [N/A:N/A] Measurements L x W x D [1:0.7x1.5x0.2] [N/A:N/A] (cm) Area (cm) : [1:0.825] [N/A:N/A] Volume (cm) : [1:0.165] [N/A:N/A] % Reduction in Area: [1:90.40%] [N/A:N/A] % Reduction in Volume: [1:90.40%] [N/A:N/A]  Classification: [1:Full Thickness Without Exposed Support Structures] [N/A:N/A] Exudate Amount: [1:Medium] [N/A:N/A] Exudate Type: [1:Serous] [N/A:N/A] Exudate Color: [1:amber] [N/A:N/A] Wound Margin: [1:Flat and Intact] [N/A:N/A] Granulation Amount: [1:Large (67-100%)] [N/A:N/A] Granulation Quality: [1:Red, Hyper-granulation] [N/A:N/A] Necrotic Amount: [1:Small (1-33%)] [N/A:N/A] Exposed Structures: [1:Fat Layer (Subcutaneous Tissue) Exposed: Yes Fascia: No Tendon: No Muscle: No Joint: No Bone: No] [N/A:N/A] Epithelialization: [1:None] [N/A:N/A] Debridement: [1:Debridement - Excisional] [N/A:N/A] Pre-procedure [1:08:22] [N/A:N/A] Verification/Time Out Taken: Pain Control: [1:Lidocaine 4% Topical Solution] [N/A:N/A] Tissue Debrided: [1:Subcutaneous, Slough] [N/A:N/A] Level: [1:Skin/Subcutaneous Tissue] [N/A:N/A] Debridement Area (sq cm): 1.05 N/A N/A Instrument: Curette N/A N/A Bleeding: Minimum N/A N/A Hemostasis Achieved: Pressure N/A N/A Procedural Pain: 0 N/A N/A Post  Procedural Pain: 0 N/A N/A Debridement Treatment Procedure was tolerated well N/A N/A Response: Post Debridement 0.7x1.5x0.3 N/A N/A Measurements L x W x D (cm) Post Debridement Volume: 0.247 N/A N/A (cm) Periwound Skin Texture: Excoriation: No N/A N/A Induration: No Callus: No Crepitus: No Rash: No Scarring: No Periwound Skin Moisture: Maceration: No N/A N/A Dry/Scaly: No Periwound Skin Color: Atrophie Blanche: No N/A N/A Cyanosis: No Ecchymosis: No Erythema: No Hemosiderin Staining: No Mottled: No Pallor: No Rubor: No Temperature: No Abnormality N/A N/A Tenderness on Palpation: No N/A N/A Wound Preparation: Ulcer Cleansing: N/A N/A Rinsed/Irrigated with Saline Topical Anesthetic Applied: Other: lidocaine 4% Procedures Performed: Debridement N/A N/A Treatment Notes Electronic Signature(s) Signed: 11/16/2017 8:26:35 AM By: Lawanda Cousins Entered By: Lawanda Cousins on 11/16/2017 08:26:35 [name] ([id number]) -------------------------------------------------------------------------------- [place] Details Patient Name: [name] Date of Service: 11/16/2017 8:00 AM Medical Record Number: [id number] Patient Account Number: [id number] Date of Birth/Sex: 04-16-1929 (84 y.o. M) Treating RN: Ahmed Prima Primary Care Lyndsay Talamante: Lavone Orn Other Clinician: Referring Tacey Dimaggio: Lavone Orn Treating Briasia Flinders/Extender: Cathie Olden in Treatment: 3 Active Inactive ` Orientation to the Wound Care Program Nursing Diagnoses: Knowledge deficit related to the wound healing center program Goals: Patient/caregiver will verbalize understanding of the [place] Program Date Initiated: 10/26/2017 Target Resolution Date: 11/16/2017 Goal Status: Active Interventions: Provide education on orientation to the wound center Notes: ` Wound/Skin Impairment Nursing Diagnoses: Impaired tissue integrity Goals: Patient/caregiver will  verbalize understanding of skin care regimen Date Initiated: 10/26/2017 Target Resolution Date: 11/16/2017 Goal Status: Active Ulcer/skin breakdown will have a volume reduction of 30% by week 4 Date Initiated: 10/26/2017 Target Resolution Date: 11/16/2017 Goal Status: Active Interventions: Assess patient/caregiver ability to obtain necessary supplies Assess patient/caregiver ability to perform ulcer/skin care regimen upon admission and as needed Assess ulceration(s) every visit Treatment Activities: Skin care regimen initiated : 10/26/2017 Notes: Electronic Signature(s) Signed: 11/16/2017 3:04:13 PM By: Merton Border, Juliann Pulse ([id number]) Entered By: Alric Quan on 11/16/2017 08:22:30 [name] ([id number]) -------------------------------------------------------------------------------- Pain Assessment Details Patient Name: [name] Date of Service: 11/16/2017 8:00 AM Medical Record Number: [id number] Patient Account Number: [id number] Date of Birth/Sex: January 09, 1929 (84 y.o. M) Treating RN: Roger Shelter Primary Care Ianna Salmela: Lavone Orn Other Clinician: Referring Nidhi Jacome: Lavone Orn Treating Donie Lemelin/Extender: Cathie Olden in Treatment: 3 Active Problems Location of Pain Severity and Description of Pain Patient Has Paino No Site Locations Pain Management and Medication Current Pain Management: Electronic Signature(s) Signed: 11/16/2017 9:30:03 AM By: Roger Shelter Entered By: Roger Shelter on 11/16/2017 08:08:45 [name] ([id number]) -------------------------------------------------------------------------------- Patient/Caregiver Education Details Patient Name: [name] Date of Service: 11/16/2017 8:00 AM Medical Record Number: [id number] Patient Account Number: [id number] Date of Birth/Gender: February 05, 1929 (84 y.o. M) Treating RN: Ahmed Prima Primary Care Physician: Lavone Orn Other Clinician: Referring Physician:  Lavone Orn  Treating Physician/Extender: Cathie Olden in Treatment: 3 Education Assessment Education Provided To: Patient Education Topics Provided Wound/Skin Impairment: Handouts: Caring for Your Ulcer, Skin Care Do's and Dont's, Other: change dressing as ordered Methods: Demonstration, Explain/Verbal Responses: State content correctly Electronic Signature(s) Signed: 11/16/2017 3:04:13 PM By: Alric Quan Entered By: Alric Quan on 11/16/2017 08:28:19 [name] ([id number]) -------------------------------------------------------------------------------- Wound Assessment Details Patient Name: [name] Date of Service: 11/16/2017 8:00 AM Medical Record Number: [id number] Patient Account Number: [id number] Date of Birth/Sex: February 04, 1929 (84 y.o. M) Treating RN: Roger Shelter Primary Care Creig Landin: Lavone Orn Other Clinician: Referring Nehemiah Montee: Lavone Orn Treating Edit Ricciardelli/Extender: Lawanda Cousins Weeks in Treatment: 3 Wound Status Wound Number: 1 Primary Etiology: Trauma, Other Wound Location: Left Lower Leg - Anterior Wound Status: Open Wounding Event: Trauma Comorbid History: Arrhythmia, Hypertension Date Acquired: 10/05/2017 Weeks Of Treatment: 3 Clustered Wound: No Photos Photo Uploaded By: Roger Shelter on 11/16/2017 09:28:18 Wound Measurements Length: (cm) 0.7 Width: (cm) 1.5 Depth: (cm) 0.2 Area: (cm) 0.825 Volume: (cm) 0.165 % Reduction in Area: 90.4% % Reduction in Volume: 90.4% Epithelialization: None Tunneling: No Undermining: No Wound Description Full Thickness Without Exposed Support Classification: Structures Wound Margin: Flat and Intact Exudate Medium Amount: Exudate Type: Serous Exudate Color: amber Foul Odor After Cleansing: No Slough/Fibrino Yes Wound Bed Granulation Amount: Large (67-100%) Exposed Structure Granulation Quality: Red, Hyper-granulation Fascia Exposed: No Necrotic Amount: Small  (1-33%) Fat Layer (Subcutaneous Tissue) Exposed: Yes Necrotic Quality: Adherent Slough Tendon Exposed: No Muscle Exposed: No Joint Exposed: No Bone Exposed: No Tarr, Eldin ([id number]) Periwound Skin Texture Texture Color No Abnormalities Noted: No No Abnormalities Noted: No Callus: No Atrophie Blanche: No Crepitus: No Cyanosis: No Excoriation: No Ecchymosis: No Induration: No Erythema: No Rash: No Hemosiderin Staining: No Scarring: No Mottled: No Pallor: No Moisture Rubor: No No Abnormalities Noted: No Dry / Scaly: No Temperature / Pain Maceration: No Temperature: No Abnormality Wound Preparation Ulcer Cleansing: Rinsed/Irrigated with Saline Topical Anesthetic Applied: Other: lidocaine 4%, Treatment Notes Wound #1 (Left, Anterior Lower Leg) 1. Cleansed with: Clean wound with Normal Saline 2. Anesthetic Topical Lidocaine 4% cream to wound bed prior to debridement 4. Dressing Applied: Promogran 5. Secondary Dressing Applied Bordered Foam Dressing Electronic Signature(s) Signed: 11/16/2017 9:30:03 AM By: Roger Shelter Entered By: Roger Shelter on 11/16/2017 08:15:23 [name] ([id number]) -------------------------------------------------------------------------------- Vitals Details Patient Name: [name] Date of Service: 11/16/2017 8:00 AM Medical Record Number: [id number] Patient Account Number: [id number] Date of Birth/Sex: 1929-05-30 (84 y.o. M) Treating RN: Roger Shelter Primary Care Hannah Strader: Lavone Orn Other Clinician: Referring Cecille Mcclusky: Lavone Orn Treating Hazen Brumett/Extender: Cathie Olden in Treatment: 3 Vital Signs Time Taken: 08:08 Temperature (F): 97.7 Height (in): 69 Pulse (bpm): 63 Weight (lbs): 170 Respiratory Rate (breaths/min): 16 Body Mass Index (BMI): 25.1 Blood Pressure (mmHg): 145/62 Reference Range: 80 - 120 mg / dl Electronic Signature(s) Signed: 11/16/2017 9:30:03 AM By: Roger Shelter Entered By: Roger Shelter on 11/16/2017 08:09:06

## 2017-11-19 NOTE — Progress Notes (Signed)
Geoffrey, [name] ([id number]) Visit Report for 11/16/2017 Chief Complaint Document Details Patient Name: [name], [name] Date of Service: 11/16/2017 8:00 AM Medical Record Number: [id number] Patient Account Number: [id number] Date of Birth/Sex: [birth year] (84 y.o. M) Treating RN: Ahmed Prima Primary Care Provider: Lavone Orn Other Clinician: Referring Provider: Lavone Orn Treating Provider/Extender: Cathie Olden in Treatment: 3 Information Obtained from: Patient Chief Complaint He is here for left lower extremity wound Electronic Signature(s) Signed: 11/16/2017 8:27:01 AM By: Lawanda Cousins Entered By: Lawanda Cousins on 11/16/2017 08:27:01 [name] ([id number]) -------------------------------------------------------------------------------- Debridement Details Patient Name: [name] Date of Service: 11/16/2017 8:00 AM Medical Record Number: [id number] Patient Account Number: [id number] Date of Birth/Sex: July 27, 1928 (84 y.o. M) Treating RN: Ahmed Prima Primary Care Provider: Lavone Orn Other Clinician: Referring Provider: Lavone Orn Treating Provider/Extender: Cathie Olden in Treatment: 3 Debridement Performed for Wound #1 Left,Anterior Lower Leg Assessment: Performed By: Physician Lawanda Cousins, NP Debridement Type: Debridement Pre-procedure Verification/Time Yes - 08:22 Out Taken: Start Time: 08:22 Pain Control: Lidocaine 4% Topical Solution Total Area Debrided (L x W): 0.7 (cm) x 1.5 (cm) = 1.05 (cm) Tissue and other material Viable, Non-Viable, Slough, Subcutaneous, Fibrin/Exudate, Slough debrided: Level: Skin/Subcutaneous Tissue Debridement Description: Excisional Instrument: Curette Bleeding: Minimum Hemostasis Achieved: Pressure End Time: 08:24 Procedural Pain: 0 Post Procedural Pain: 0 Response to Treatment: Procedure was tolerated well Level of Consciousness: Awake and Alert Post Procedure Vitals: Temperature:  97.7 Pulse: 63 Respiratory Rate: 18 Blood Pressure: Systolic Blood Pressure: 824 Diastolic Blood Pressure: 62 Post Debridement Measurements of Total Wound Length: (cm) 0.7 Width: (cm) 1.5 Depth: (cm) 0.3 Volume: (cm) 0.247 Character of Wound/Ulcer Post Debridement: Requires Further Debridement Post Procedure Diagnosis Same as Pre-procedure Electronic Signature(s) Signed: 11/16/2017 8:26:51 AM By: Lawanda Cousins Signed: 11/16/2017 3:04:13 PM By: Alric Quan Entered By: Lawanda Cousins on 11/16/2017 08:26:51 [name] ([id number]) -------------------------------------------------------------------------------- HPI Details Patient Name: [name] Date of Service: 11/16/2017 8:00 AM Medical Record Number: [id number] Patient Account Number: [id number] Date of Birth/Sex: March 05, 1929 (84 y.o. M) Treating RN: Ahmed Prima Primary Care Provider: Lavone Orn Other Clinician: Referring Provider: Lavone Orn Treating Provider/Extender: Cathie Olden in Treatment: 3 History of Present Illness HPI Description: 10/26/17-He is here for an initial evaluation for left lower extremity traumatic injury. He states that on approximate 3 weeks ago he injured his leg with a step stool while cleaning his car. He saw Dr. Deforest Hoyles at [facility] on 5/15 who prescribed doxycycline; he had a follow-up on 5/20 when he was referred to the wound clinic and instructed to continue doxycycline and Neosporin. He denies pain, he denies edema. He is nondiabetic and a former smoker, quit in 1968. He does wear compression stockings/wraps. 11/02/17-He is here for follow-up evaluation of the left lower extremity ulcer. There is significant improvement in both measurements and appearance. He completed doxycycline on Saturday. We will change to [facility] and he will follow-up next week 11/09/17- He is here for follow up evaluation of the left lower extremity ulcer. He continues to progress, we will  continue with same treatment plan and he will follow up next week 11/16/17-He is here for follow-up evaluation for left lower extremity ulcer. He continues to make progress with improvement and measurement; continues to have red granulation tissue throughout. We will continue with same treatment plan he will follow-up next week Electronic Signature(s) Signed: 11/16/2017 8:27:32 AM By: Lawanda Cousins Entered By: Lawanda Cousins on 11/16/2017 08:27:31 [name] ([id number]) -------------------------------------------------------------------------------- Physician Orders Details Patient Name: [name] Date of Service:  11/16/2017 8:00 AM Medical Record Number: [id number] Patient Account Number: [id number] Date of Birth/Sex: 1929-04-30 (84 y.o. M) Treating RN: Ahmed Prima Primary Care Provider: Lavone Orn Other Clinician: Referring Provider: Lavone Orn Treating Provider/Extender: Cathie Olden in Treatment: 3 Verbal / Phone Orders: Yes Clinician: Pinkerton, Debi Read Back and Verified: Yes Diagnosis Coding Wound Cleansing Wound #1 Left,Anterior Lower Leg o Clean wound with Normal Saline. o Cleanse wound with mild soap and water Anesthetic (add to Medication List) Wound #1 Left,Anterior Lower Leg o Topical Lidocaine 4% cream applied to wound bed prior to debridement (In Clinic Only). Skin Barriers/Peri-Wound Care Wound #1 Left,Anterior Lower Leg o Skin Prep Primary Wound Dressing Wound #1 Left,Anterior Lower Leg o Collagen Secondary Dressing Wound #1 Left,Anterior Lower Leg o Boardered Foam Dressing Dressing Change Frequency Wound #1 Left,Anterior Lower Leg o Dressing is to be changed Monday and Thursday. Follow-up Appointments Wound #1 Left,Anterior Lower Leg o Return Appointment in 1 week. Edema Control o Elevate legs to the level of the heart and pump ankles as often as possible Additional Orders / Instructions Wound #1 Left,Anterior  Lower Leg o Increase protein intake. Patient Medications Allergies: Septra Notifications Medication Indication Start End lidocaine Burkle, Neely ([id number]) Notifications Medication Indication Start End DOSE 1 - topical 4 % cream - 1 cream topical Electronic Signature(s) Signed: 11/16/2017 3:04:13 PM By: Alric Quan Signed: 11/16/2017 3:35:38 PM By: Lawanda Cousins Entered By: Alric Quan on 11/16/2017 08:27:07 LAMARIUS, DIRR ([id number]) -------------------------------------------------------------------------------- Prescription 11/16/2017 Patient Name: [name] Provider: Lawanda Cousins NP Date of Birth: [birth year] NPI#: [id number] Sex: M DEA#: [id number] Phone #: [phone] License #: Patient Address: [place] [address] [facility] [place], [place] [address] [address], [place], [place] [address] [phone] Allergies Septra Medication Medication: Route: Strength: Form: lidocaine 4 % topical cream topical 4% cream Class: TOPICAL LOCAL ANESTHETICS Dose: Frequency / Time: Indication: 1 1 cream topical Number of Refills: Number of Units: 0 Generic Substitution: Start Date: End Date: One Time Use: Substitution Permitted No Note to Pharmacy: Signature(s): Date(s): Electronic Signature(s) Signed: 11/16/2017 3:04:13 PM By: Alric Quan Signed: 11/16/2017 3:35:38 PM By: Lawanda Cousins Entered By: Alric Quan on 11/16/2017 08:27:07 [name] ([id number]) --------------------------------------------------------------------------------  Problem List Details Patient Name: [name] Date of Service: 11/16/2017 8:00 AM Medical Record Number: [id number] Patient Account Number: [id number] Date of Birth/Sex: 25-Jul-1928 (84 y.o. M) Treating RN: Ahmed Prima Primary Care Provider: Lavone Orn Other Clinician: Referring Provider: Lavone Orn Treating  Provider/Extender: Cathie Olden in Treatment: 3 Active Problems ICD-10 Impacting Encounter Code Description Active Date Wound Healing Diagnosis L97.222 Non-pressure chronic ulcer of left calf with fat layer exposed 10/26/2017 No Yes S81.812S Laceration without foreign body, left lower leg, sequela 10/26/2017 No Yes Inactive Problems Resolved Problems Electronic Signature(s) Signed: 11/16/2017 8:26:30 AM By: Lawanda Cousins Entered By: Lawanda Cousins on 11/16/2017 08:26:30 [name] ([id number]) -------------------------------------------------------------------------------- Progress Note Details Patient Name: [name] Date of Service: 11/16/2017 8:00 AM Medical Record Number: [id number] Patient Account Number: [id number] Date of Birth/Sex: [birth year] (84 y.o. M) Treating RN: Ahmed Prima Primary Care Provider: Lavone Orn Other Clinician: Referring Provider: Lavone Orn Treating Provider/Extender: Cathie Olden in Treatment: 3 Subjective Chief Complaint Information obtained from Patient He is here for left lower extremity wound History of Present Illness (HPI) 10/26/17-He is here for an initial evaluation for left lower extremity traumatic injury. He states that on approximate 3 weeks ago he injured his leg with a step stool while  cleaning his car. He saw Dr. Deforest Hoyles at [facility] on 5/15 who prescribed doxycycline; he had a follow-up on 5/20 when he was referred to the wound clinic and instructed to continue doxycycline and Neosporin. He denies pain, he denies edema. He is nondiabetic and a former smoker, quit in 1968. He does wear compression stockings/wraps. 11/02/17-He is here for follow-up evaluation of the left lower extremity ulcer. There is significant improvement in both measurements and appearance. He completed doxycycline on Saturday. We will change to [facility] and he will follow-up next week 11/09/17- He is here for follow up evaluation of the  left lower extremity ulcer. He continues to progress, we will continue with same treatment plan and he will follow up next week 11/16/17-He is here for follow-up evaluation for left lower extremity ulcer. He continues to make progress with improvement and measurement; continues to have red granulation tissue throughout. We will continue with same treatment plan he will follow-up next week Patient History Information obtained from Patient. Family History Heart Disease - Father,Mother, Hypertension - Mother,Father, No family history of Cancer, Hereditary Spherocytosis, Kidney Disease, Lung Disease, Seizures, Stroke, Thyroid Problems, Tuberculosis. Social History Former smoker - quit 50 years ago, Alcohol Use - Never, Drug Use - No History, Caffeine Use - Daily. Medical And Surgical History Notes Oncologic multiple melanomas Objective AMAAD, BYERS ([id number]) Constitutional Vitals Time Taken: 8:08 AM, Height: 69 in, Weight: 170 lbs, BMI: 25.1, Temperature: 97.7 F, Pulse: 63 bpm, Respiratory Rate: 16 breaths/min, Blood Pressure: 145/62 mmHg. Integumentary (Hair, Skin) Wound #1 status is Open. Original cause of wound was Trauma. The wound is located on the Left,Anterior Lower Leg. The wound measures 0.7cm length x 1.5cm width x 0.2cm depth; 0.825cm^2 area and 0.165cm^3 volume. There is Fat Layer (Subcutaneous Tissue) Exposed exposed. There is no tunneling or undermining noted. There is a medium amount of serous drainage noted. The wound margin is flat and intact. There is large (67-100%) red, hyper - granulation within the wound bed. There is a small (1-33%) amount of necrotic tissue within the wound bed including Adherent Slough. The periwound skin appearance did not exhibit: Callus, Crepitus, Excoriation, Induration, Rash, Scarring, Dry/Scaly, Maceration, Atrophie Blanche, Cyanosis, Ecchymosis, Hemosiderin Staining, Mottled, Pallor, Rubor, Erythema. Periwound temperature was noted as No  Abnormality. Assessment Active Problems ICD-10 Non-pressure chronic ulcer of left calf with fat layer exposed Laceration without foreign body, left lower leg, sequela Procedures Wound #1 Pre-procedure diagnosis of Wound #1 is a Trauma, Other located on the Left,Anterior Lower Leg . There was a Excisional Skin/Subcutaneous Tissue Debridement with a total area of 1.05 sq cm performed by Lawanda Cousins, NP. With the following instrument(s): Curette to remove Viable and Non-Viable tissue/material. Material removed includes Subcutaneous Tissue, Slough, and Fibrin/Exudate after achieving pain control using Lidocaine 4% Topical Solution. No specimens were taken. A time out was conducted at 08:22, prior to the start of the procedure. A Minimum amount of bleeding was controlled with Pressure. The procedure was tolerated well with a pain level of 0 throughout and a pain level of 0 following the procedure. Patient s Level of Consciousness post procedure was recorded as Awake and Alert. Post Debridement Measurements: 0.7cm length x 1.5cm width x 0.3cm depth; 0.247cm^3 volume. Character of Wound/Ulcer Post Debridement requires further debridement. Post procedure Diagnosis Wound #1: Same as Pre-Procedure Plan Wound Cleansing: Wound #1 Left,Anterior Lower Leg: Clean wound with Normal Saline. Cleanse wound with mild soap and water Anesthetic (add to Medication List): HAKIEM, MALIZIA ([id number]) Wound #1 Left,Anterior  Lower Leg: Topical Lidocaine 4% cream applied to wound bed prior to debridement (In Clinic Only). Skin Barriers/Peri-Wound Care: Wound #1 Left,Anterior Lower Leg: Skin Prep Primary Wound Dressing: Wound #1 Left,Anterior Lower Leg: Collagen Secondary Dressing: Wound #1 Left,Anterior Lower Leg: Boardered Foam Dressing Dressing Change Frequency: Wound #1 Left,Anterior Lower Leg: Dressing is to be changed Monday and Thursday. Follow-up Appointments: Wound #1 Left,Anterior Lower  Leg: Return Appointment in 1 week. Edema Control: Elevate legs to the level of the heart and pump ankles as often as possible Additional Orders / Instructions: Wound #1 Left,Anterior Lower Leg: Increase protein intake. The following medication(s) was prescribed: lidocaine topical 4 % cream 1 1 cream topical was prescribed at facility Electronic Signature(s) Signed: 11/16/2017 8:28:19 AM By: Lawanda Cousins Entered By: Lawanda Cousins on 11/16/2017 08:28:19 [name] ([id number]) -------------------------------------------------------------------------------- ROS/PFSH Details Patient Name: [name] Date of Service: 11/16/2017 8:00 AM Medical Record Number: [id number] Patient Account Number: [id number] Date of Birth/Sex: 1928/07/07 (84 y.o. 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, VRE)o Yes Date: 10/19/2017 Have you tested positive for osteomyelitis (bone infection)o No Have you had any tests for circulation on your legso No Have you had other problems associated with your woundso Infection Eyes Medical History: Negative for: Cataracts; Glaucoma; Optic Neuritis Ear/Nose/Mouth/Throat Medical History: Negative for: Chronic sinus problems/congestion; Middle ear problems Hematologic/Lymphatic Medical History: Negative for: Anemia; Hemophilia; Human Immunodeficiency Virus; Lymphedema; Sickle Cell  Disease Respiratory Medical History: Negative for: Aspiration; Asthma; Chronic Obstructive Pulmonary Disease (COPD); Pneumothorax; Sleep Apnea; Tuberculosis Cardiovascular Medical History: Positive for: Arrhythmia - a fib; Hypertension Negative for: Angina; Congestive Heart Failure; Coronary Artery Disease; Deep Vein Thrombosis; Hypotension; Myocardial Infarction; Peripheral Arterial Disease; Peripheral Venous Disease; Phlebitis; Vasculitis Gastrointestinal Medical History: Negative for: Cirrhosis ; Colitis; Crohnos; Hepatitis A; Hepatitis B; Hepatitis C Endocrine JALAN, FARISS ([id number]) Medical History: Negative for: Type I Diabetes; Type II Diabetes Genitourinary Medical History: Negative for: End Stage Renal Disease Immunological Medical History: Negative for: Lupus Erythematosus; Raynaudos; Scleroderma Integumentary (Skin) Medical History: Negative for: History of Burn; History of pressure wounds Musculoskeletal Medical History: Negative for: Gout; Rheumatoid Arthritis; Osteoarthritis; Osteomyelitis Neurologic Medical History: Negative for: Dementia; Neuropathy; Quadriplegia; Paraplegia; Seizure Disorder Oncologic Medical History: Negative for: Received Chemotherapy; Received Radiation Past Medical History Notes: multiple melanomas Psychiatric Medical History: Negative for: Anorexia/bulimia; Confinement Anxiety Immunizations Pneumococcal Vaccine: Received Pneumococcal Vaccination: Yes Implantable Devices Family and Social History Cancer: No; Heart Disease: Yes - Father,Mother; Hereditary Spherocytosis: No; Hypertension: Yes - Mother,Father; Kidney Disease: No; Lung Disease: No; Seizures: No; Stroke: No; Thyroid Problems: No; Tuberculosis: No; Former smoker - quit 50 years ago; Alcohol Use: Never; Drug Use: No History; Caffeine Use: Daily; Financial Concerns: No; Food, Clothing or Shelter Needs: No; Support System Lacking: No; Transportation Concerns: No;  Advanced Directives: No; Patient does not want information on Advanced Directives Physician Affirmation I have reviewed and agree with the above information. Electronic Signature(s) Signed: 11/16/2017 3:04:13 PM By: Merton Border, Juliann Pulse ([id number]) Signed:  11/16/2017 3:35:38 PM By: Lawanda Cousins Entered By: Lawanda Cousins on 11/16/2017 08:27:50 [name] ([id number]) -------------------------------------------------------------------------------- [place] Details Patient Name: [name] Date of Service: 11/16/2017 Medical Record Number: [id number] Patient Account Number: [id number] Date of Birth/Sex: 04-27-1929 (84 y.o. M) Treating RN: Ahmed Prima Primary Care Provider: Lavone Orn Other Clinician: Referring Provider: Lavone Orn Treating Provider/Extender: Cathie Olden in Treatment: 3 Diagnosis Coding ICD-10 Codes Code Description [phone] Non-pressure chronic ulcer of left calf with fat layer exposed S81.812S Laceration without foreign body, left lower leg, sequela Facility Procedures CPT4 Code: [id number] Description: 03546 - DEB SUBQ TISSUE 20 SQ CM/< ICD-10 Diagnosis Description L97.222 Non-pressure chronic ulcer of left calf with fat layer expo S81.812S Laceration without foreign body, left lower leg, sequela Modifier: sed Quantity: 1 Physician Procedures CPT4 Code: [id number] Description: 17001 - WC PHYS SUBQ TISS 20 SQ CM ICD-10 Diagnosis Description L97.222 Non-pressure chronic ulcer of left calf with fat layer expo S81.812S Laceration without foreign body, left lower leg, sequela Modifier: sed Quantity: 1 Electronic Signature(s) Signed: 11/16/2017 8:28:28 AM By: Lawanda Cousins Entered By: Lawanda Cousins on 11/16/2017 74:94:49

## 2017-11-23 ENCOUNTER — Encounter: Payer: Medicare Other | Admitting: Nurse Practitioner

## 2017-11-23 DIAGNOSIS — L97822 Non-pressure chronic ulcer of other part of left lower leg with fat layer exposed: Secondary | ICD-10-CM | POA: Diagnosis not present

## 2017-11-23 DIAGNOSIS — L97222 Non-pressure chronic ulcer of left calf with fat layer exposed: Secondary | ICD-10-CM | POA: Diagnosis not present

## 2017-11-25 NOTE — Progress Notes (Signed)
[name], [name] ([id number]) Visit Report for 11/23/2017 Chief Complaint Document Details Patient Name: [name], [name] Date of Service: 11/23/2017 8:00 AM Medical Record Number: [id number] Patient Account Number: [id number] Date of Birth/Sex: 08/17/1928 (84 y.o. M) Treating RN: Ahmed Prima Primary Care Provider: Lavone Orn Other Clinician: Referring Provider: Lavone Orn Treating Provider/Extender: Cathie Olden in Treatment: 4 Information Obtained from: Patient Chief Complaint He is here for left lower extremity wound Electronic Signature(s) Signed: 11/23/2017 8:49:22 AM By: Lawanda Cousins Entered By: Lawanda Cousins on 11/23/2017 08:49:22 [name] ([id number]) -------------------------------------------------------------------------------- Debridement Details Patient Name: [name] Date of Service: 11/23/2017 8:00 AM Medical Record Number: [id number] Patient Account Number: [id number] Date of Birth/Sex: 16-Jan-1929 (84 y.o. M) Treating RN: Ahmed Prima Primary Care Provider: Lavone Orn Other Clinician: Referring Provider: Lavone Orn Treating Provider/Extender: Cathie Olden in Treatment: 4 Debridement Performed for Wound #1 Left,Anterior Lower Leg Assessment: Performed By: Physician Lawanda Cousins, NP Debridement Type: Debridement Pre-procedure Verification/Time Yes - 08:18 Out Taken: Start Time: 08:18 Pain Control: Lidocaine 4% Topical Solution Total Area Debrided (L x W): 0.5 (cm) x 0.6 (cm) = 0.3 (cm) Tissue and other material Viable, Slough, Subcutaneous, Slough, Hyper-granulation debrided: Level: Skin/Subcutaneous Tissue Debridement Description: Excisional Instrument: Curette Bleeding: Minimum Hemostasis Achieved: Pressure End Time: 08:20 Procedural Pain: 0 Post Procedural Pain: 0 Response to Treatment: Procedure was tolerated well Level of Consciousness: Awake and Alert Post Procedure Vitals: Temperature: 97.6 Pulse:  69 Respiratory Rate: 18 Blood Pressure: Systolic Blood Pressure: 024 Diastolic Blood Pressure: 67 Post Debridement Measurements of Total Wound Length: (cm) 0.5 Width: (cm) 0.6 Depth: (cm) 0.2 Volume: (cm) 0.047 Character of Wound/Ulcer Post Debridement: Requires Further Debridement Post Procedure Diagnosis Same as Pre-procedure Electronic Signature(s) Signed: 11/23/2017 8:49:03 AM By: Lawanda Cousins Signed: 11/23/2017 4:29:22 PM By: Alric Quan Entered By: Lawanda Cousins on 11/23/2017 08:49:03 [name] ([id number]) -------------------------------------------------------------------------------- HPI Details Patient Name: [name] Date of Service: 11/23/2017 8:00 AM Medical Record Number: [id number] Patient Account Number: [id number] Date of Birth/Sex: 12-12-1928 (84 y.o. M) Treating RN: Ahmed Prima Primary Care Provider: Lavone Orn Other Clinician: Referring Provider: Lavone Orn Treating Provider/Extender: Cathie Olden in Treatment: 4 History of Present Illness HPI Description: 10/26/17-He is here for an initial evaluation for left lower extremity traumatic injury. He states that on approximate 3 weeks ago he injured his leg with a step stool while cleaning his car. He saw Dr. Deforest Hoyles at [facility] on 5/15 who prescribed doxycycline; he had a follow-up on 5/20 when he was referred to the wound clinic and instructed to continue doxycycline and Neosporin. He denies pain, he denies edema. He is nondiabetic and a former smoker, quit in 1968. He does wear compression stockings/wraps. 11/02/17-He is here for follow-up evaluation of the left lower extremity ulcer. There is significant improvement in both measurements and appearance. He completed doxycycline on Saturday. We will change to [facility] and he will follow-up next week 11/09/17- He is here for follow up evaluation of the left lower extremity ulcer. He continues to progress, we will continue  with same treatment plan and he will follow up next week 11/16/17-He is here for follow-up evaluation for left lower extremity ulcer. He continues to make progress with improvement and measurement; continues to have red granulation tissue throughout. We will continue with same treatment plan he will follow-up next week 11/23/17-He is here in follow up evaluation for left lower extremity ulcer. He continues to make progress, there is hyper granulation present in the wound today we will switch to [facility]. He'll follow-up next week, anticipate he will be healed at that time Electronic Signature(s) Signed: 11/23/2017 8:49:57 AM By: Lawanda Cousins Entered By: Lawanda Cousins on 11/23/2017 08:49:57 [name] ([id number]) -------------------------------------------------------------------------------- Physician Orders Details Patient Name: [name] Date of Service: 11/23/2017 8:00 AM Medical Record Number: [id number] Patient Account Number: [id number] Date of Birth/Sex: 1928-11-28 (84 y.o. M) Treating RN: Ahmed Prima Primary Care Provider: Lavone Orn Other Clinician: Referring Provider: Lavone Orn Treating Provider/Extender: Cathie Olden in Treatment: 4 Verbal / Phone Orders: Yes Clinician: Pinkerton, Debi Read Back and Verified: Yes Diagnosis Coding Wound Cleansing Wound #1 Left,Anterior Lower Leg o Clean wound with Normal Saline. o Cleanse wound with mild soap and water Anesthetic (add to Medication List) Wound #1 Left,Anterior Lower Leg o Topical Lidocaine 4% cream applied to wound bed prior to debridement (In Clinic Only). Skin Barriers/Peri-Wound Care Wound #1 Left,Anterior Lower Leg o Skin Prep Primary Wound Dressing Wound #1 Left,Anterior Lower Leg o Hydrafera Blue Ready Transfer Secondary Dressing Wound #1 Left,Anterior Lower Leg o Boardered Foam Dressing Dressing Change Frequency Wound #1 Left,Anterior Lower Leg o Dressing is to  be changed Monday and Thursday. Follow-up Appointments Wound #1 Left,Anterior Lower Leg o Return Appointment in 1 week. Edema Control o Elevate legs to the level of the heart and pump ankles as often as possible Additional Orders / Instructions Wound #1 Left,Anterior Lower Leg o Increase protein intake. Patient Medications Allergies: Septra Notifications Medication Indication Start End lidocaine Gohman, Jakhai ([id number]) Notifications Medication Indication Start End DOSE 1 - topical 4 % cream - 1 cream topical Electronic Signature(s) Signed: 11/23/2017 4:29:22 PM By: Alric Quan Signed: 11/23/2017 5:32:34 PM By: Lawanda Cousins Entered By: Alric Quan on 11/23/2017 08:20:30 [name], [name] ([id number]) -------------------------------------------------------------------------------- Prescription 11/23/2017 Patient Name: [name] Provider: Lawanda Cousins NP Date of Birth: 07/26/1928 NPI#: [id number] Sex: M DEA#: [id number] Phone #: [phone] License #: Patient Address: [place] [address] [facility] [place], [place] [address] [address] [place], [place] [address] [phone] Allergies Septra Medication Medication: Route: Strength: Form: lidocaine topical 4% cream Class: TOPICAL LOCAL ANESTHETICS Dose: Frequency / Time: Indication: 1 1 cream topical Number of Refills: Number of Units: 0 Generic Substitution: Start Date: End Date: Administered at [place]: Yes Time Administered: Time Discontinued: Note to Pharmacy: Signature(s): Date(s): Electronic Signature(s) Signed: 11/23/2017 4:29:22 PM By: Alric Quan Signed: 11/23/2017 5:32:34 PM By: Lawanda Cousins Entered By: Alric Quan on 11/23/2017 08:20:31 [name] ([id number]) Duanne Limerick, Juliann Pulse  ([id number]) --------------------------------------------------------------------------------  Problem List Details Patient Name: [name] Date of Service: 11/23/2017 8:00 AM Medical Record Number: [id number] Patient Account Number: [id number] Date of Birth/Sex: 06/12/1928 (84 y.o. M) Treating RN: Ahmed Prima Primary Care Provider: Lavone Orn Other Clinician: Referring Provider: Lavone Orn Treating Provider/Extender: Cathie Olden in Treatment: 4 Active Problems ICD-10 Evaluated Encounter Code Description Active Date Today Diagnosis L97.222 Non-pressure chronic ulcer of left calf with fat layer exposed 10/26/2017 No Yes S81.812S Laceration without foreign body, left lower leg, sequela 10/26/2017 No Yes Inactive Problems Resolved Problems Electronic Signature(s) Signed: 11/23/2017 8:48:38 AM By: Lawanda Cousins Entered By: Lawanda Cousins on 11/23/2017 08:48:38 [name] ([id number]) -------------------------------------------------------------------------------- Progress Note Details Patient Name: [name] Date of Service: 11/23/2017 8:00 AM Medical Record Number: [id number] Patient Account Number: [id number] Date of Birth/Sex: 1929/05/23 (84 y.o. M) Treating RN: Ahmed Prima Primary Care Provider: Lavone Orn Other Clinician: Referring Provider: Lavone Orn Treating Provider/Extender: Cathie Olden in Treatment: 4 Subjective Chief  Complaint Information obtained from Patient He is here for left lower extremity wound History of Present Illness (HPI) 10/26/17-He is here for an initial evaluation for left lower extremity traumatic injury. He states that on approximate 3 weeks ago he injured his leg with a step stool while cleaning his car. He saw Dr. Deforest Hoyles at [facility] on 5/15 who prescribed doxycycline; he had a follow-up on 5/20 when he was referred to the wound clinic and instructed to continue doxycycline and Neosporin. He denies  pain, he denies edema. He is nondiabetic and a former smoker, quit in 1968. He does wear compression stockings/wraps. 11/02/17-He is here for follow-up evaluation of the left lower extremity ulcer. There is significant improvement in both measurements and appearance. He completed doxycycline on Saturday. We will change to [facility] and he will follow-up next week 11/09/17- He is here for follow up evaluation of the left lower extremity ulcer. He continues to progress, we will continue with same treatment plan and he will follow up next week 11/16/17-He is here for follow-up evaluation for left lower extremity ulcer. He continues to make progress with improvement and measurement; continues to have red granulation tissue throughout. We will continue with same treatment plan he will follow-up next week 11/23/17-He is here in follow up evaluation for left lower extremity ulcer. He continues to make progress, there is hyper granulation present in the wound today we will switch to [facility]. He'll follow-up next week, anticipate he will be healed at that time Patient History Information obtained from Patient. Family History Heart Disease - Father,Mother, Hypertension - Mother,Father, No family history of Cancer, Hereditary Spherocytosis, Kidney Disease, Lung Disease, Seizures, Stroke, Thyroid Problems, Tuberculosis. Social History Former smoker - quit 50 years ago, Alcohol Use - Never, Drug Use - No History, Caffeine Use - Daily. Medical And Surgical History Notes Oncologic multiple melanomas Review of Systems (ROS) Constitutional Symptoms (General Health) Denies complaints or symptoms of Fatigue, Fever, Chills. Cardiovascular Denies complaints or symptoms of LE edema. [name], [name] ([id number]) Objective Constitutional Vitals Time Taken: 8:08 AM, Height: 69 in, Weight: 170 lbs, BMI: 25.1, Temperature: 97.6 F, Pulse: 69 bpm, Respiratory Rate: 16 breaths/min, Blood Pressure: 151/76  mmHg. Integumentary (Hair, Skin) Wound #1 status is Open. Original cause of wound was Trauma. The wound is located on the Left,Anterior Lower Leg. The wound measures 0.5cm length x 0.6cm width x 0.1cm depth; 0.236cm^2 area and 0.024cm^3 volume. There is Fat Layer (Subcutaneous Tissue) Exposed exposed. There is no tunneling or undermining noted. There is a medium amount of serous drainage noted. The wound margin is flat and intact. There is large (67-100%) red, hyper - granulation within the wound bed. There is a small (1-33%) amount of necrotic tissue within the wound bed including Adherent Slough. The periwound skin appearance did not exhibit: Callus, Crepitus, Excoriation, Induration, Rash, Scarring, Dry/Scaly, Maceration, Atrophie Blanche, Cyanosis, Ecchymosis, Hemosiderin Staining, Mottled, Pallor, Rubor, Erythema. Periwound temperature was noted as No Abnormality. Assessment Active Problems ICD-10 Non-pressure chronic ulcer of left calf with fat layer exposed Laceration without foreign body, left lower leg, sequela Procedures Wound #1 Pre-procedure diagnosis of Wound #1 is a Trauma, Other located on the Left,Anterior Lower Leg . There was a Excisional Skin/Subcutaneous Tissue Debridement with a total area of 0.3 sq cm performed by Lawanda Cousins, NP. With the following instrument(s): Curette to remove Viable tissue/material. Material removed includes Subcutaneous Tissue, Slough, and Hyper- granulation after achieving pain control using Lidocaine 4% Topical Solution. No specimens were taken. A time  out was conducted at 08:18, prior to the start of the procedure. A Minimum amount of bleeding was controlled with Pressure. The procedure was tolerated well with a pain level of 0 throughout and a pain level of 0 following the procedure. Patient s Level of Consciousness post procedure was recorded as Awake and Alert. Post Debridement Measurements: 0.5cm length x 0.6cm width x 0.2cm depth;  0.047cm^3 volume. Character of Wound/Ulcer Post Debridement requires further debridement. Post procedure Diagnosis Wound #1: Same as Pre-Procedure [name], [name] ([id number]) Plan Wound Cleansing: Wound #1 Left,Anterior Lower Leg: Clean wound with Normal Saline. Cleanse wound with mild soap and water Anesthetic (add to Medication List): Wound #1 Left,Anterior Lower Leg: Topical Lidocaine 4% cream applied to wound bed prior to debridement (In Clinic Only). Skin Barriers/Peri-Wound Care: Wound #1 Left,Anterior Lower Leg: Skin Prep Primary Wound Dressing: Wound #1 Left,Anterior Lower Leg: Hydrafera Blue Ready Transfer Secondary Dressing: Wound #1 Left,Anterior Lower Leg: Boardered Foam Dressing Dressing Change Frequency: Wound #1 Left,Anterior Lower Leg: Dressing is to be changed Monday and Thursday. Follow-up Appointments: Wound #1 Left,Anterior Lower Leg: Return Appointment in 1 week. Edema Control: Elevate legs to the level of the heart and pump ankles as often as possible Additional Orders / Instructions: Wound #1 Left,Anterior Lower Leg: Increase protein intake. The following medication(s) was prescribed: lidocaine topical 4 % cream 1 1 cream topical was prescribed at facility Electronic Signature(s) Signed: 11/23/2017 8:50:44 AM By: Lawanda Cousins Entered By: Lawanda Cousins on 11/23/2017 08:50:44 [name] ([id number]) -------------------------------------------------------------------------------- ROS/PFSH Details Patient Name: [name] Date of Service: 11/23/2017 8:00 AM Medical Record Number: [id number] Patient Account Number: [id number] Date of Birth/Sex: Nov 30, 1928 (84 y.o. M) Treating RN: Ahmed Prima Primary Care Provider: Lavone Orn Other Clinician: Referring Provider: Lavone Orn Treating Provider/Extender: Cathie Olden in Treatment: 4 Information Obtained From Patient Wound History Do you currently have one or more open woundso  Yes How many open wounds do you currently haveo 1 Approximately how long have you had your woundso 3 weeks How have you been treating your wound(s) until nowo bandage Has your wound(s) ever healed and then re-openedo No Have you had any lab work done in the past montho No Have you tested positive for an antibiotic resistant organism (MRSA, VRE)o Yes Date: 10/19/2017 Have you tested positive for osteomyelitis (bone infection)o No Have you had any tests for circulation on your legso No Have you had other problems associated with your woundso Infection Constitutional Symptoms (General Health) Complaints and Symptoms: Negative for: Fatigue; Fever; Chills Cardiovascular Complaints and Symptoms: Negative for: LE edema Medical History: Positive for: Arrhythmia - a fib; Hypertension Negative for: Angina; Congestive Heart Failure; Coronary Artery Disease; Deep Vein Thrombosis; Hypotension; Myocardial Infarction; Peripheral Arterial Disease; Peripheral Venous Disease; Phlebitis; Vasculitis Eyes Medical History: Negative for: Cataracts; Glaucoma; Optic Neuritis Ear/Nose/Mouth/Throat Medical History: Negative for: Chronic sinus problems/congestion; Middle ear problems Hematologic/Lymphatic Medical History: Negative for: Anemia; Hemophilia; Human Immunodeficiency Virus; Lymphedema; Sickle Cell Disease Respiratory Medical History: Negative for: Aspiration; Asthma; Chronic Obstructive Pulmonary Disease (COPD); Pneumothorax; Sleep Apnea; [name], [name] ([id number]) Tuberculosis Gastrointestinal Medical History: Negative for: Cirrhosis ; Colitis; Crohnos; Hepatitis A; Hepatitis B; Hepatitis C Endocrine Medical History: Negative for: Type I Diabetes; Type II Diabetes Genitourinary Medical History: Negative for: End Stage Renal Disease Immunological Medical History: Negative for: Lupus Erythematosus; Raynaudos; Scleroderma Integumentary (Skin) Medical History: Negative for: History of  Burn; History of pressure wounds Musculoskeletal Medical History: Negative for: Gout; Rheumatoid Arthritis; Osteoarthritis; Osteomyelitis Neurologic Medical History: Negative for: Dementia; Neuropathy;  Quadriplegia; Paraplegia; Seizure Disorder Oncologic Medical History: Negative for: Received Chemotherapy; Received Radiation Past Medical History Notes: multiple melanomas Psychiatric Medical History: Negative for: Anorexia/bulimia; Confinement Anxiety Immunizations Pneumococcal Vaccine: Received Pneumococcal Vaccination: Yes Implantable Devices Family and Social History Cancer: No; Heart Disease: Yes - Father,Mother; Hereditary Spherocytosis: No; Hypertension: Yes - Mother,Father; Kidney Disease: No; Lung Disease: No; Seizures: No; Stroke: No; Thyroid Problems: No; Tuberculosis: No; Former smoker - quit 50 years ago; Alcohol Use: Never; Drug Use: No History; Caffeine Use: Daily; Financial Concerns: No; Food, Clothing or [name], [name] ([id number]) Needs: No; Support System Lacking: No; Transportation Concerns: No; Advanced Directives: No; Patient does not want information on Advanced Directives Physician Affirmation I have reviewed and agree with the above information. Electronic Signature(s) Signed: 11/23/2017 4:29:22 PM By: Alric Quan Signed: 11/23/2017 5:32:34 PM By: Lawanda Cousins Entered By: Lawanda Cousins on 11/23/2017 08:50:33 [name] ([id number]) -------------------------------------------------------------------------------- SuperBill Details Patient Name: [name] Date of Service: 11/23/2017 Medical Record Number: [id number] Patient Account Number: [id number] Date of Birth/Sex: 04/12/1929 (84 y.o. M) Treating RN: Ahmed Prima Primary Care Provider: Lavone Orn Other Clinician: Referring Provider: Lavone Orn Treating Provider/Extender: Cathie Olden in Treatment: 4 Diagnosis Coding ICD-10 Codes Code Description [phone]  Non-pressure chronic ulcer of left calf with fat layer exposed S81.812S Laceration without foreign body, left lower leg, sequela Facility Procedures CPT4 Code: 28768115 Description: 72620 - DEB SUBQ TISSUE 20 SQ CM/< ICD-10 Diagnosis Description L97.222 Non-pressure chronic ulcer of left calf with fat layer expo S81.812S Laceration without foreign body, left lower leg, sequela Modifier: sed Quantity: 1 Physician Procedures CPT4 Code: 3559741 Description: 63845 - WC PHYS SUBQ TISS 20 SQ CM ICD-10 Diagnosis Description L97.222 Non-pressure chronic ulcer of left calf with fat layer expo S81.812S Laceration without foreign body, left lower leg, sequela Modifier: sed Quantity: 1 Electronic Signature(s) Signed: 11/23/2017 8:50:56 AM By: Lawanda Cousins Entered By: Lawanda Cousins on 11/23/2017 08:50:56

## 2017-11-25 NOTE — Progress Notes (Signed)
[name], [name] ([id number]) Visit Report for 11/23/2017 Arrival Information Details Patient Name: [name], [name] Date of Service: 11/23/2017 8:00 AM Medical Record Number: [id number] Patient Account Number: [id number] Date of Birth/Sex: Feb 02, 1929 (84 y.o. M) Treating RN: Roger Shelter Primary Care Satsuki Zillmer: Lavone Orn Other Clinician: Referring Annisa Mazzarella: Lavone Orn Treating Atlantis Delong/Extender: Cathie Olden in Treatment: 4 Visit Information History Since Last Visit All ordered tests and consults were completed: No Patient Arrived: Ambulatory Added or deleted any medications: No Arrival Time: 08:06 Any new allergies or adverse reactions: No Accompanied By: self Had a fall or experienced change in No Transfer Assistance: None activities of daily living that may affect Patient Identification Verified: Yes risk of falls: Secondary Verification Process Yes Signs or symptoms of abuse/neglect since last visito No Completed: Hospitalized since last visit: No Patient Has Alerts: Yes Implantable device outside of the clinic excluding No Patient Alerts: Patient on Blood cellular tissue based products placed in the center Thinner since last visit: Eliquis Pain Present Now: No Electronic Signature(s) Signed: 11/23/2017 10:41:08 AM By: Roger Shelter Entered By: Roger Shelter on 11/23/2017 08:07:45 [name] ([id number]) -------------------------------------------------------------------------------- Encounter Discharge Information Details Patient Name: [name] Date of Service: 11/23/2017 8:00 AM Medical Record Number: [id number] Patient Account Number: [id number] Date of Birth/Sex: November 09, 1928 (84 y.o. M) Treating RN: Roger Shelter Primary Care Maelynn Moroney: Lavone Orn Other Clinician: Referring Lorain Fettes: Lavone Orn Treating Jamiere Gulas/Extender: Cathie Olden in Treatment: 4 Encounter Discharge Information Items Discharge Condition:  Stable Ambulatory Status: Ambulatory Discharge Destination: Home Transportation: Private Auto Schedule Follow-up Appointment: Yes Clinical Summary of Care: Electronic Signature(s) Signed: 11/23/2017 10:41:08 AM By: Roger Shelter Entered By: Roger Shelter on 11/23/2017 08:26:19 [name] ([id number]) -------------------------------------------------------------------------------- Lower Extremity Assessment Details Patient Name: [name] Date of Service: 11/23/2017 8:00 AM Medical Record Number: [id number] Patient Account Number: [id number] Date of Birth/Sex: [birth year] (84 y.o. M) Treating RN: Roger Shelter Primary Care Michaela Shankel: Lavone Orn Other Clinician: Referring Iwalani Templeton: Lavone Orn Treating Iria Jamerson/Extender: Cathie Olden in Treatment: 4 Edema Assessment Assessed: [Left: No] [Right: No] Edema: [Left: Ye] [Right: s] Calf Left: Right: Point of Measurement: 34 cm From Medial Instep 33.4 cm cm Ankle Left: Right: Point of Measurement: 12 cm From Medial Instep 22 cm cm Vascular Assessment Claudication: Claudication Assessment [Left:None] Pulses: Dorsalis Pedis Palpable: [Left:Yes] Posterior Tibial Extremity colors, hair growth, and conditions: Extremity Color: [Left:Normal] Hair Growth on Extremity: [Left:Yes] Temperature of Extremity: [Left:Warm] Capillary Refill: [Left:< 3 seconds] Toe Nail Assessment Left: Right: Thick: Yes Discolored: Yes Deformed: No Improper Length and Hygiene: No Electronic Signature(s) Signed: 11/23/2017 10:41:08 AM By: Roger Shelter Entered By: Roger Shelter on 11/23/2017 08:13:46 [name] ([id number]) -------------------------------------------------------------------------------- Multi Wound Chart Details Patient Name: [name] Date of Service: 11/23/2017 8:00 AM Medical Record Number: [id number] Patient Account Number: [id number] Date of Birth/Sex: 1929/05/28 (84 y.o. M) Treating RN:  Ahmed Prima Primary Care Asuzena Weis: Lavone Orn Other Clinician: Referring Suhaylah Wampole: Lavone Orn Treating Nanako Stopher/Extender: Cathie Olden in Treatment: 4 Vital Signs Height(in): 26 Pulse(bpm): 70 Weight(lbs): 170 Blood Pressure(mmHg): 151/76 Body Mass Index(BMI): 25 Temperature(F): 97.6 Respiratory Rate 16 (breaths/min): Photos: [1:No Photos] [N/A:N/A] Wound Location: [1:Left Lower Leg - Anterior] [N/A:N/A] Wounding Event: [1:Trauma] [N/A:N/A] Primary Etiology: [1:Trauma, Other] [N/A:N/A] Comorbid History: [1:Arrhythmia, Hypertension] [N/A:N/A] Date Acquired: [1:10/05/2017] [N/A:N/A] Weeks of Treatment: [1:4] [N/A:N/A] Wound Status: [1:Open] [N/A:N/A] Measurements L x W x D [1:0.5x0.6x0.1] [N/A:N/A] (cm) Area (cm) : [1:0.236] [N/A:N/A] Volume (cm) : [1:0.024] [N/A:N/A] % Reduction in Area: [1:97.20%] [N/A:N/A] % Reduction in Volume: [1:98.60%] [N/A:N/A] Classification: [1:Full Thickness  Without Exposed Support Structures] [N/A:N/A] Exudate Amount: [1:Medium] [N/A:N/A] Exudate Type: [1:Serous] [N/A:N/A] Exudate Color: [1:amber] [N/A:N/A] Wound Margin: [1:Flat and Intact] [N/A:N/A] Granulation Amount: [1:Large (67-100%)] [N/A:N/A] Granulation Quality: [1:Red, Hyper-granulation] [N/A:N/A] Necrotic Amount: [1:Small (1-33%)] [N/A:N/A] Exposed Structures: [1:Fat Layer (Subcutaneous Tissue) Exposed: Yes Fascia: No Tendon: No Muscle: No Joint: No Bone: No] [N/A:N/A] Epithelialization: [1:Small (1-33%)] [N/A:N/A] Debridement: [1:Debridement - Excisional] [N/A:N/A] Pre-procedure [1:08:18] [N/A:N/A] Verification/Time Out Taken: Pain Control: [1:Lidocaine 4% Topical Solution] [N/A:N/A] Tissue Debrided: [1:Subcutaneous, Slough] [N/A:N/A] Level: [1:Skin/Subcutaneous Tissue] [N/A:N/A] Debridement Area (sq cm): 0.3 N/A N/A Instrument: Curette N/A N/A Bleeding: Minimum N/A N/A Hemostasis Achieved: Pressure N/A N/A Procedural Pain: 0 N/A N/A Post Procedural Pain: 0  N/A N/A Debridement Treatment Procedure was tolerated well N/A N/A Response: Post Debridement 0.5x0.6x0.2 N/A N/A Measurements L x W x D (cm) Post Debridement Volume: 0.047 N/A N/A (cm) Periwound Skin Texture: Excoriation: No N/A N/A Induration: No Callus: No Crepitus: No Rash: No Scarring: No Periwound Skin Moisture: Maceration: No N/A N/A Dry/Scaly: No Periwound Skin Color: Atrophie Blanche: No N/A N/A Cyanosis: No Ecchymosis: No Erythema: No Hemosiderin Staining: No Mottled: No Pallor: No Rubor: No Temperature: No Abnormality N/A N/A Tenderness on Palpation: No N/A N/A Wound Preparation: Ulcer Cleansing: N/A N/A Rinsed/Irrigated with Saline Topical Anesthetic Applied: Other: lidocaine 4% Procedures Performed: Debridement N/A N/A Treatment Notes Wound #1 (Left, Anterior Lower Leg) 1. Cleansed with: Clean wound with Normal Saline 2. Anesthetic Topical Lidocaine 4% cream to wound bed prior to debridement 4. Dressing Applied: Hydrafera Blue 5. Secondary Dressing Applied Bordered Foam Dressing Electronic Signature(s) Signed: 11/23/2017 8:48:45 AM By: Lawanda Cousins Entered By: Lawanda Cousins on 11/23/2017 08:48:45 [name], [name] ([id number]) -------------------------------------------------------------------------------- Multi-Disciplinary Care Plan Details Patient Name: [name] Date of Service: 11/23/2017 8:00 AM Medical Record Number: [id number] Patient Account Number: [id number] Date of Birth/Sex: 04-Apr-1929 (84 y.o. M) Treating RN: Ahmed Prima Primary Care Davan Nawabi: Lavone Orn Other Clinician: Referring Torrez Renfroe: Lavone Orn Treating Sanye Ledesma/Extender: Cathie Olden in Treatment: 4 Active Inactive ` Orientation to the Wound Care Program Nursing Diagnoses: Knowledge deficit related to the wound healing center program Goals: Patient/caregiver will verbalize understanding of the [place] Program Date Initiated:  10/26/2017 Target Resolution Date: 11/16/2017 Goal Status: Active Interventions: Provide education on orientation to the wound center Notes: ` Wound/Skin Impairment Nursing Diagnoses: Impaired tissue integrity Goals: Patient/caregiver will verbalize understanding of skin care regimen Date Initiated: 10/26/2017 Target Resolution Date: 11/16/2017 Goal Status: Active Ulcer/skin breakdown will have a volume reduction of 30% by week 4 Date Initiated: 10/26/2017 Target Resolution Date: 11/16/2017 Goal Status: Active Interventions: Assess patient/caregiver ability to obtain necessary supplies Assess patient/caregiver ability to perform ulcer/skin care regimen upon admission and as needed Assess ulceration(s) every visit Treatment Activities: Skin care regimen initiated : 10/26/2017 Notes: Electronic Signature(s) Signed: 11/23/2017 4:29:22 PM By: Merton Border, Juliann Pulse ([id number]) Entered By: Alric Quan on 11/23/2017 08:18:22 [name] ([id number]) -------------------------------------------------------------------------------- Pain Assessment Details Patient Name: [name] Date of Service: 11/23/2017 8:00 AM Medical Record Number: [id number] Patient Account Number: [id number] Date of Birth/Sex: Aug 12, 1928 (84 y.o. M) Treating RN: Roger Shelter Primary Care Sherran Margolis: Lavone Orn Other Clinician: Referring Trina Asch: Lavone Orn Treating Darriona Dehaas/Extender: Cathie Olden in Treatment: 4 Active Problems Location of Pain Severity and Description of Pain Patient Has Paino No Site Locations Pain Management and Medication Current Pain Management: Electronic Signature(s) Signed: 11/23/2017 10:41:08 AM By: Roger Shelter Entered By: Roger Shelter on 11/23/2017 08:08:15 [name] ([id number]) -------------------------------------------------------------------------------- Patient/Caregiver Education Details Patient Name: [name] Date  of  Service: 11/23/2017 8:00 AM Medical Record Number: [id number] Patient Account Number: [id number] Date of Birth/Gender: 1929/02/16 (84 y.o. M) Treating RN: Roger Shelter Primary Care Physician: Lavone Orn Other Clinician: Referring Physician: Lavone Orn Treating Physician/Extender: Cathie Olden in Treatment: 4 Education Assessment Education Provided To: Patient Education Topics Provided Wound Debridement: Handouts: Wound Debridement Methods: Explain/Verbal Responses: State content correctly Wound/Skin Impairment: Handouts: Caring for Your Ulcer Methods: Explain/Verbal Responses: State content correctly Electronic Signature(s) Signed: 11/23/2017 10:41:08 AM By: Roger Shelter Entered By: Roger Shelter on 11/23/2017 08:26:35 [name] ([id number]) -------------------------------------------------------------------------------- Wound Assessment Details Patient Name: [name] Date of Service: 11/23/2017 8:00 AM Medical Record Number: [id number] Patient Account Number: [id number] Date of Birth/Sex: August 05, 1928 (84 y.o. M) Treating RN: Roger Shelter Primary Care Reda Gettis: Lavone Orn Other Clinician: Referring Trip Cavanagh: Lavone Orn Treating Laqueshia Cihlar/Extender: Cathie Olden in Treatment: 4 Wound Status Wound Number: 1 Primary Etiology: Trauma, Other Wound Location: Left Lower Leg - Anterior Wound Status: Open Wounding Event: Trauma Comorbid History: Arrhythmia, Hypertension Date Acquired: 10/05/2017 Weeks Of Treatment: 4 Clustered Wound: No Photos Photo Uploaded By: Roger Shelter on 11/23/2017 16:18:59 Wound Measurements Length: (cm) 0.5 Width: (cm) 0.6 Depth: (cm) 0.1 Area: (cm) 0.236 Volume: (cm) 0.024 % Reduction in Area: 97.2% % Reduction in Volume: 98.6% Epithelialization: Small (1-33%) Tunneling: No Undermining: No Wound Description Full Thickness Without Exposed Support Classification: Structures Wound Margin:  Flat and Intact Exudate Medium Amount: Exudate Type: Serous Exudate Color: amber Foul Odor After Cleansing: No Slough/Fibrino Yes Wound Bed Granulation Amount: Large (67-100%) Exposed Structure Granulation Quality: Red, Hyper-granulation Fascia Exposed: No Necrotic Amount: Small (1-33%) Fat Layer (Subcutaneous Tissue) Exposed: Yes Necrotic Quality: Adherent Slough Tendon Exposed: No Muscle Exposed: No Joint Exposed: No Bone Exposed: No [name], Geoffrey ([id number]) Periwound Skin Texture Texture Color No Abnormalities Noted: No No Abnormalities Noted: No Callus: No Atrophie Blanche: No Crepitus: No Cyanosis: No Excoriation: No Ecchymosis: No Induration: No Erythema: No Rash: No Hemosiderin Staining: No Scarring: No Mottled: No Pallor: No Moisture Rubor: No No Abnormalities Noted: No Dry / Scaly: No Temperature / Pain Maceration: No Temperature: No Abnormality Wound Preparation Ulcer Cleansing: Rinsed/Irrigated with Saline Topical Anesthetic Applied: Other: lidocaine 4%, Treatment Notes Wound #1 (Left, Anterior Lower Leg) 1. Cleansed with: Clean wound with Normal Saline 2. Anesthetic Topical Lidocaine 4% cream to wound bed prior to debridement 4. Dressing Applied: Hydrafera Blue 5. Secondary Dressing Applied Bordered Foam Dressing Electronic Signature(s) Signed: 11/23/2017 10:41:08 AM By: Roger Shelter Entered By: Roger Shelter on 11/23/2017 08:12:16 [name] ([id number]) -------------------------------------------------------------------------------- Vitals Details Patient Name: [name] Date of Service: 11/23/2017 8:00 AM Medical Record Number: [id number] Patient Account Number: [id number] Date of Birth/Sex: 09/02/1928 (84 y.o. M) Treating RN: Roger Shelter Primary Care Carole Deere: Lavone Orn Other Clinician: Referring Alaila Pillard: Lavone Orn Treating Codie Krogh/Extender: Cathie Olden in Treatment: 4 Vital Signs Time Taken:  08:08 Temperature (F): 97.6 Height (in): 69 Pulse (bpm): 69 Weight (lbs): 170 Respiratory Rate (breaths/min): 16 Body Mass Index (BMI): 25.1 Blood Pressure (mmHg): 151/76 Reference Range: 80 - 120 mg / dl Electronic Signature(s) Signed: 11/23/2017 10:41:08 AM By: Roger Shelter Entered By: Roger Shelter on 11/23/2017 08:08:52

## 2017-11-30 ENCOUNTER — Encounter: Payer: Medicare Other | Admitting: Nurse Practitioner

## 2017-11-30 DIAGNOSIS — L97822 Non-pressure chronic ulcer of other part of left lower leg with fat layer exposed: Secondary | ICD-10-CM | POA: Diagnosis not present

## 2017-11-30 DIAGNOSIS — L97222 Non-pressure chronic ulcer of left calf with fat layer exposed: Secondary | ICD-10-CM | POA: Diagnosis not present

## 2017-12-05 NOTE — Progress Notes (Signed)
BRINSON, TOZZI ([id number]) Visit Report for 11/30/2017 Arrival Information Details Patient Name: [name], [name] Date of Service: 11/30/2017 8:00 AM Medical Record Number: [id number] Patient Account Number: [id number] Date of Birth/Sex: 05-15-1929 (84 y.o. M) Treating RN: Roger Shelter Primary Care Deandrae Wajda: Lavone Orn Other Clinician: Referring Sevan Mcbroom: Lavone Orn Treating Kwaku Mostafa/Extender: Cathie Olden in Treatment: 5 Visit Information History Since Last Visit All ordered tests and consults were completed: No Patient Arrived: Ambulatory Added or deleted any medications: No Arrival Time: 08:03 Any new allergies or adverse reactions: No Accompanied By: self Had a fall or experienced change in No Transfer Assistance: None activities of daily living that may affect Patient Identification Verified: Yes risk of falls: Secondary Verification Process Yes Signs or symptoms of abuse/neglect since last visito No Completed: Hospitalized since last visit: No Patient Has Alerts: Yes Implantable device outside of the clinic excluding No Patient Alerts: Patient on Blood cellular tissue based products placed in the center Thinner since last visit: Eliquis Pain Present Now: No Electronic Signature(s) Signed: 11/30/2017 3:08:47 PM By: Roger Shelter Entered By: Roger Shelter on 11/30/2017 08:04:08 [name] ([id number]) -------------------------------------------------------------------------------- Clinic Level of Care Assessment Details Patient Name: [name] Date of Service: 11/30/2017 8:00 AM Medical Record Number: [id number] Patient Account Number: [id number] Date of Birth/Sex: 1928-09-01 (84 y.o. M) Treating RN: Ahmed Prima Primary Care Shahira Fiske: Lavone Orn Other Clinician: Referring Alex Mcmanigal: Lavone Orn Treating Avik Leoni/Extender: Cathie Olden in Treatment: 5 Clinic Level of Care Assessment Items TOOL 4 Quantity Score X - Use when  only an EandM is performed on FOLLOW-UP visit 1 0 ASSESSMENTS - Nursing Assessment / Reassessment X - Reassessment of Co-morbidities (includes updates in patient status) 1 10 X- 1 5 Reassessment of Adherence to Treatment Plan ASSESSMENTS - Wound and Skin Assessment / Reassessment X - Simple Wound Assessment / Reassessment - one wound 1 5 []  - 0 Complex Wound Assessment / Reassessment - multiple wounds []  - 0 Dermatologic / Skin Assessment (not related to wound area) ASSESSMENTS - Focused Assessment []  - Circumferential Edema Measurements - multi extremities 0 []  - 0 Nutritional Assessment / Counseling / Intervention []  - 0 Lower Extremity Assessment (monofilament, tuning fork, pulses) []  - 0 Peripheral Arterial Disease Assessment (using hand held doppler) ASSESSMENTS - Ostomy and/or Continence Assessment and Care []  - Incontinence Assessment and Management 0 []  - 0 Ostomy Care Assessment and Management (repouching, etc.) PROCESS - Coordination of Care X - Simple Patient / Family Education for ongoing care 1 15 []  - 0 Complex (extensive) Patient / Family Education for ongoing care []  - 0 Staff obtains Programmer, systems, Records, Test Results / Process Orders []  - 0 Staff telephones HHA, Nursing Homes / Clarify orders / etc []  - 0 Routine Transfer to another Facility (non-emergent condition) []  - 0 Routine Hospital Admission (non-emergent condition) []  - 0 New Admissions / Biomedical engineer / Ordering NPWT, Apligraf, etc. []  - 0 Emergency Hospital Admission (emergent condition) X- 1 10 Simple Discharge Coordination JUDDSON, COBERN ([id number]) []  - 0 Complex (extensive) Discharge Coordination PROCESS - Special Needs []  - Pediatric / Minor Patient Management 0 []  - 0 Isolation Patient Management []  - 0 Hearing / Language / Visual special needs []  - 0 Assessment of Community assistance (transportation, D/C planning, etc.) []  - 0 Additional assistance / Altered  mentation []  - 0 Support Surface(s) Assessment (bed, cushion, seat, etc.) INTERVENTIONS - Wound Cleansing / Measurement X - Simple Wound Cleansing - one wound 1 5 []  - 0 Complex Wound Cleansing - multiple  wounds X- 1 5 Wound Imaging (photographs - any number of wounds) []  - 0 Wound Tracing (instead of photographs) X- 1 5 Simple Wound Measurement - one wound []  - 0 Complex Wound Measurement - multiple wounds INTERVENTIONS - Wound Dressings X - Small Wound Dressing one or multiple wounds 1 10 []  - 0 Medium Wound Dressing one or multiple wounds []  - 0 Large Wound Dressing one or multiple wounds X- 1 5 Application of Medications - topical []  - 0 Application of Medications - injection INTERVENTIONS - Miscellaneous []  - External ear exam 0 []  - 0 Specimen Collection (cultures, biopsies, blood, body fluids, etc.) []  - 0 Specimen(s) / Culture(s) sent or taken to Lab for analysis []  - 0 Patient Transfer (multiple staff / Civil Service fast streamer / Similar devices) []  - 0 Simple Staple / Suture removal (25 or less) []  - 0 Complex Staple / Suture removal (26 or more) []  - 0 Hypo / Hyperglycemic Management (close monitor of Blood Glucose) []  - 0 Ankle / Brachial Index (ABI) - do not check if billed separately X- 1 5 Vital Signs Mattice, Saifullah ([id number]) Has the patient been seen at the hospital within the last three years: Yes Total Score: 80 Level Of Care: New/Established - Level 3 Electronic Signature(s) Signed: 12/04/2017 5:11:46 PM By: Alric Quan Entered By: Alric Quan on 11/30/2017 08:33:36 [name] ([id number]) -------------------------------------------------------------------------------- Encounter Discharge Information Details Patient Name: [name] Date of Service: 11/30/2017 8:00 AM Medical Record Number: [id number] Patient Account Number: [id number] Date of Birth/Sex: 28-Sep-1928 (84 y.o. M) Treating RN: Ahmed Prima Primary Care Elmer Merwin: Lavone Orn Other Clinician: Referring Asad Keeven: Lavone Orn Treating Elicia Lui/Extender: Cathie Olden in Treatment: 5 Encounter Discharge Information Items Discharge Condition: Stable Ambulatory Status: Ambulatory Discharge Destination: Home Transportation: Private Auto Accompanied By: self Schedule Follow-up Appointment: No Clinical Summary of Care: Electronic Signature(s) Signed: 12/04/2017 5:11:46 PM By: Alric Quan Entered By: Alric Quan on 11/30/2017 08:24:49 [name] ([id number]) -------------------------------------------------------------------------------- Lower Extremity Assessment Details Patient Name: [name] Date of Service: 11/30/2017 8:00 AM Medical Record Number: [id number] Patient Account Number: [id number] Date of Birth/Sex: 1929/03/10 (84 y.o. M) Treating RN: Roger Shelter Primary Care Icelynn Onken: Lavone Orn Other Clinician: Referring Tilford Deaton: Lavone Orn Treating Jakiera Ehler/Extender: Cathie Olden in Treatment: 5 Edema Assessment Assessed: [Left: No] [Right: No] Edema: [Left: N] [Right: o] Calf Left: Right: Point of Measurement: 34 cm From Medial Instep 34 cm cm Ankle Left: Right: Point of Measurement: 12 cm From Medial Instep 21.5 cm cm Vascular Assessment Claudication: Claudication Assessment [Left:None] Pulses: Dorsalis Pedis Palpable: [Left:Yes] Posterior Tibial Extremity colors, hair growth, and conditions: Extremity Color: [Left:Normal] Hair Growth on Extremity: [Left:Yes] Temperature of Extremity: [Left:Warm] Capillary Refill: [Left:< 3 seconds] Toe Nail Assessment Left: Right: Thick: Yes Discolored: Yes Deformed: No Improper Length and Hygiene: No Electronic Signature(s) Signed: 11/30/2017 3:08:47 PM By: Roger Shelter Entered By: Roger Shelter on 11/30/2017 08:11:32 [name] ([id number]) -------------------------------------------------------------------------------- Multi Wound Chart  Details Patient Name: [name] Date of Service: 11/30/2017 8:00 AM Medical Record Number: [id number] Patient Account Number: [id number] Date of Birth/Sex: [birth year] (84 y.o. M) Treating RN: Ahmed Prima Primary Care Delma Drone: Lavone Orn Other Clinician: Referring Tonyia Marschall: Lavone Orn Treating Ayako Tapanes/Extender: Cathie Olden in Treatment: 5 Vital Signs Height(in): 76 Pulse(bpm): 51 Weight(lbs): 170 Blood Pressure(mmHg): 157/66 Body Mass Index(BMI): 25 Temperature(F): 98.3 Respiratory Rate 18 (breaths/min): Photos: [1:No Photos] [N/A:N/A] Wound Location: [1:Left Lower Leg - Anterior] [N/A:N/A] Wounding Event: [1:Trauma] [N/A:N/A] Primary Etiology: [1:Trauma, Other] [N/A:N/A] Comorbid History: [1:Arrhythmia, Hypertension] [N/A:N/A]  Date Acquired: [1:10/05/2017] [N/A:N/A] Weeks of Treatment: [1:5] [N/A:N/A] Wound Status: [1:Open] [N/A:N/A] Measurements L x W x D [1:0.2x0.3x0.1] [N/A:N/A] (cm) Area (cm) : [1:0.047] [N/A:N/A] Volume (cm) : [1:0.005] [N/A:N/A] % Reduction in Area: [1:99.50%] [N/A:N/A] % Reduction in Volume: [1:99.70%] [N/A:N/A] Classification: [1:Full Thickness Without Exposed Support Structures] [N/A:N/A] Exudate Amount: [1:Small] [N/A:N/A] Exudate Type: [1:Serous] [N/A:N/A] Exudate Color: [1:amber] [N/A:N/A] Wound Margin: [1:Flat and Intact] [N/A:N/A] Granulation Amount: [1:Large (67-100%)] [N/A:N/A] Granulation Quality: [1:Red, Hyper-granulation] [N/A:N/A] Necrotic Amount: [1:Small (1-33%)] [N/A:N/A] Necrotic Tissue: [1:Eschar] [N/A:N/A] Exposed Structures: [1:Fat Layer (Subcutaneous Tissue) Exposed: Yes Fascia: No Tendon: No Muscle: No Joint: No Bone: No] [N/A:N/A] Epithelialization: [1:Medium (34-66%)] [N/A:N/A] Periwound Skin Texture: [1:Excoriation: No Induration: No Callus: No Crepitus: No] [N/A:N/A] Rash: No Scarring: No Periwound Skin Moisture: Maceration: No N/A N/A Dry/Scaly: No Periwound Skin Color: Atrophie  Blanche: No N/A N/A Cyanosis: No Ecchymosis: No Erythema: No Hemosiderin Staining: No Mottled: No Pallor: No Rubor: No Temperature: No Abnormality N/A N/A Tenderness on Palpation: No N/A N/A Wound Preparation: Ulcer Cleansing: N/A N/A Rinsed/Irrigated with Saline Topical Anesthetic Applied: Other: lidocaine 4% Treatment Notes Wound #1 (Left, Anterior Lower Leg) 1. Cleansed with: Clean wound with Normal Saline 5. Secondary Dressing Applied Bordered Foam Dressing Electronic Signature(s) Signed: 11/30/2017 8:26:22 AM By: Lawanda Cousins Entered By: Lawanda Cousins on 11/30/2017 42:35:36 [name] ([id number]) -------------------------------------------------------------------------------- [place] Details Patient Name: [name] Date of Service: 11/30/2017 8:00 AM Medical Record Number: [id number] Patient Account Number: [id number] Date of Birth/Sex: 1929-03-29 (84 y.o. M) Treating RN: Ahmed Prima Primary Care Eveleigh Crumpler: Lavone Orn Other Clinician: Referring Janann Boeve: Lavone Orn Treating Sharlet Notaro/Extender: Lawanda Cousins Weeks in Treatment: 5 Active Inactive Electronic Signature(s) Signed: 12/04/2017 5:11:46 PM By: Alric Quan Entered By: Alric Quan on 11/30/2017 08:23:29 [name] ([id number]) -------------------------------------------------------------------------------- Pain Assessment Details Patient Name: [name] Date of Service: 11/30/2017 8:00 AM Medical Record Number: [id number] Patient Account Number: [id number] Date of Birth/Sex: 02/24/1929 (84 y.o. M) Treating RN: Roger Shelter Primary Care Romario Tith: Lavone Orn Other Clinician: Referring Ambriella Kitt: Lavone Orn Treating Saretta Dahlem/Extender: Cathie Olden in Treatment: 5 Active Problems Location of Pain Severity and Description of Pain Patient Has Paino No Site Locations Pain Management and Medication Current Pain Management: Electronic  Signature(s) Signed: 11/30/2017 3:08:47 PM By: Roger Shelter Entered By: Roger Shelter on 11/30/2017 08:04:16 [name] ([id number]) -------------------------------------------------------------------------------- Patient/Caregiver Education Details Patient Name: [name] Date of Service: 11/30/2017 8:00 AM Medical Record Number: [id number] Patient Account Number: [id number] Date of Birth/Gender: Apr 20, 1929 (84 y.o. M) Treating RN: Ahmed Prima Primary Care Physician: Lavone Orn Other Clinician: Referring Physician: Lavone Orn Treating Physician/Extender: Cathie Olden in Treatment: 5 Education Assessment Education Provided To: Patient Education Topics Provided Wound/Skin Impairment: Skin Care Do's and Dont's, Other: Keep area covered and protected until Monday. Please call us if you have any Handouts: questions or concerns. Methods: Demonstration, Explain/Verbal Responses: State content correctly Electronic Signature(s) Signed: 12/04/2017 5:11:46 PM By: Alric Quan Entered By: Alric Quan on 11/30/2017 08:27:02 [name] ([id number]) -------------------------------------------------------------------------------- Wound Assessment Details Patient Name: [name] Date of Service: 11/30/2017 8:00 AM Medical Record Number: [id number] Patient Account Number: [id number] Date of Birth/Sex: 03/29/1929 (84 y.o. M) Treating RN: Roger Shelter Primary Care Starsky Nanna: Lavone Orn Other Clinician: Referring Claudell Wohler: Lavone Orn Treating Lakshya Mcgillicuddy/Extender: Lawanda Cousins Weeks in Treatment: 5 Wound Status Wound Number: 1 Primary Etiology: Trauma, Other Wound Location: Left Lower Leg - Anterior Wound Status: Open Wounding Event: Trauma Comorbid History: Arrhythmia, Hypertension Date Acquired: 10/05/2017 Weeks Of Treatment: 5 Clustered Wound: No Photos  Photo Uploaded By: Roger Shelter on 11/30/2017 14:03:59 Wound  Measurements Length: (cm) 0.2 Width: (cm) 0.3 Depth: (cm) 0.1 Area: (cm) 0.047 Volume: (cm) 0.005 % Reduction in Area: 99.5% % Reduction in Volume: 99.7% Epithelialization: Medium (34-66%) Tunneling: No Undermining: No Wound Description Full Thickness Without Exposed Support Classification: Structures Wound Margin: Flat and Intact Exudate Small Amount: Exudate Type: Serous Exudate Color: amber Foul Odor After Cleansing: No Slough/Fibrino Yes Wound Bed Granulation Amount: Large (67-100%) Exposed Structure Granulation Quality: Red, Hyper-granulation Fascia Exposed: No Necrotic Amount: Small (1-33%) Fat Layer (Subcutaneous Tissue) Exposed: Yes Necrotic Quality: Eschar Tendon Exposed: No Muscle Exposed: No Joint Exposed: No Bone Exposed: No Creed, Anthany ([id number]) Periwound Skin Texture Texture Color No Abnormalities Noted: No No Abnormalities Noted: No Callus: No Atrophie Blanche: No Crepitus: No Cyanosis: No Excoriation: No Ecchymosis: No Induration: No Erythema: No Rash: No Hemosiderin Staining: No Scarring: No Mottled: No Pallor: No Moisture Rubor: No No Abnormalities Noted: No Dry / Scaly: No Temperature / Pain Maceration: No Temperature: No Abnormality Wound Preparation Ulcer Cleansing: Rinsed/Irrigated with Saline Topical Anesthetic Applied: Other: lidocaine 4%, Electronic Signature(s) Signed: 11/30/2017 3:08:47 PM By: Roger Shelter Entered By: Roger Shelter on 11/30/2017 08:09:25 [name] ([id number]) -------------------------------------------------------------------------------- Vitals Details Patient Name: [name] Date of Service: 11/30/2017 8:00 AM Medical Record Number: [id number] Patient Account Number: [id number] Date of Birth/Sex: 1928-08-05 (84 y.o. M) Treating RN: Roger Shelter Primary Care Kiyoko Mcguirt: Lavone Orn Other Clinician: Referring Kimla Furth: Lavone Orn Treating Rebekka Lobello/Extender: Cathie Olden in Treatment: 5 Vital Signs Time Taken: 08:04 Temperature (F): 98.3 Height (in): 69 Pulse (bpm): 67 Weight (lbs): 170 Respiratory Rate (breaths/min): 18 Body Mass Index (BMI): 25.1 Blood Pressure (mmHg): 157/66 Reference Range: 80 - 120 mg / dl Electronic Signature(s) Signed: 11/30/2017 3:08:47 PM By: Roger Shelter Entered By: Roger Shelter on 11/30/2017 08:05:56

## 2017-12-05 NOTE — Progress Notes (Signed)
[name], [name] ([id number]) Visit Report for 11/30/2017 Chief Complaint Document Details Patient Name: [name], [name] Date of Service: 11/30/2017 8:00 AM Medical Record Number: [id number] Patient Account Number: [id number] Date of Birth/Sex: [birth year] (84 y.o. M) Treating RN: Ahmed Prima Primary Care Provider: Lavone Orn Other Clinician: Referring Provider: Lavone Orn Treating Provider/Extender: Cathie Olden in Treatment: 5 Information Obtained from: Patient Chief Complaint He is here for left lower extremity wound Electronic Signature(s) Signed: 11/30/2017 8:26:32 AM By: Lawanda Cousins Entered By: Lawanda Cousins on 11/30/2017 08:26:31 [name] ([id number]) -------------------------------------------------------------------------------- HPI Details Patient Name: [name] Date of Service: 11/30/2017 8:00 AM Medical Record Number: [id number] Patient Account Number: [id number] Date of Birth/Sex: 1928-07-28 (84 y.o. M) Treating RN: Ahmed Prima Primary Care Provider: Lavone Orn Other Clinician: Referring Provider: Lavone Orn Treating Provider/Extender: Cathie Olden in Treatment: 5 History of Present Illness HPI Description: 10/26/17-He is here for an initial evaluation for left lower extremity traumatic injury. He states that on approximate 3 weeks ago he injured his leg with a step stool while cleaning his car. He saw Dr. Deforest Hoyles at [facility] on 5/15 who prescribed doxycycline; he had a follow-up on 5/20 when he was referred to the wound clinic and instructed to continue doxycycline and Neosporin. He denies pain, he denies edema. He is nondiabetic and a former smoker, quit in 1968. He does wear compression stockings/wraps. 11/02/17-He is here for follow-up evaluation of the left lower extremity ulcer. There is significant improvement in both measurements and appearance. He completed doxycycline on Saturday. We will change to [facility] and he  will follow-up next week 11/09/17- He is here for follow up evaluation of the left lower extremity ulcer. He continues to progress, we will continue with same treatment plan and he will follow up next week 11/16/17-He is here for follow-up evaluation for left lower extremity ulcer. He continues to make progress with improvement and measurement; continues to have red granulation tissue throughout. We will continue with same treatment plan he will follow-up next week 11/23/17-He is here in follow up evaluation for left lower extremity ulcer. He continues to make progress, there is hyper granulation present in the wound today we will switch to [facility]. He'll follow-up next week, anticipate he will be healed at that time 11/30/17-He is here for evaluation for left lower extremity wound. This is essentially healed with small area of partial-thickness opening. We'll be closed by next Thursday; I anticipate him to be healed before then. He was asked to call the office on Monday July 8, to inform of if he is healed or needs a follow up on the 11th. Electronic Signature(s) Signed: 11/30/2017 8:27:46 AM By: Lawanda Cousins Entered By: Lawanda Cousins on 11/30/2017 08:27:46 [name] ([id number]) -------------------------------------------------------------------------------- Physician Orders Details Patient Name: [name] Date of Service: 11/30/2017 8:00 AM Medical Record Number: [id number] Patient Account Number: [id number] Date of Birth/Sex: 1928/07/18 (84 y.o. M) Treating RN: Ahmed Prima Primary Care Provider: Lavone Orn Other Clinician: Referring Provider: Lavone Orn Treating Provider/Extender: Cathie Olden in Treatment: 5 Verbal / Phone Orders: Yes Clinician: Pinkerton, Debi Read Back and Verified: Yes Diagnosis Coding Wound Cleansing Wound #1 Left,Anterior Lower Leg o Clean wound with Normal Saline. Secondary Dressing Wound #1 Left,Anterior Lower Leg o  Boardered Foam Dressing Discharge From [facility] Services Wound #1 Left,Anterior Lower Leg o Discharge from [place] area covered and protected until Monday. Please call us if you have any questions or concerns. Electronic Signature(s) Signed: 11/30/2017 9:32:27 PM By:  Abbygayle Helfand Signed: 12/04/2017 5:11:46 PM By: Alric Quan Entered By: Alric Quan on 11/30/2017 08:24:30 [name] ([id number]) -------------------------------------------------------------------------------- Problem List Details Patient Name: [name] Date of Service: 11/30/2017 8:00 AM Medical Record Number: [id number] Patient Account Number: [id number] Date of Birth/Sex: May 24, 1929 (84 y.o. M) Treating RN: Ahmed Prima Primary Care Provider: Lavone Orn Other Clinician: Referring Provider: Lavone Orn Treating Provider/Extender: Cathie Olden in Treatment: 5 Active Problems ICD-10 Evaluated Encounter Code Description Active Date Today Diagnosis L97.222 Non-pressure chronic ulcer of left calf with fat layer exposed 10/26/2017 No Yes S81.812S Laceration without foreign body, left lower leg, sequela 10/26/2017 No Yes Inactive Problems Resolved Problems Electronic Signature(s) Signed: 11/30/2017 8:26:16 AM By: Lawanda Cousins Entered By: Lawanda Cousins on 11/30/2017 08:26:16 [name] ([id number]) -------------------------------------------------------------------------------- Progress Note Details Patient Name: [name] Date of Service: 11/30/2017 8:00 AM Medical Record Number: [id number] Patient Account Number: [id number] Date of Birth/Sex: 03-09-1929 (84 y.o. M) Treating RN: Ahmed Prima Primary Care Provider: Lavone Orn Other Clinician: Referring Provider: Lavone Orn Treating Provider/Extender: Cathie Olden in Treatment: 5 Subjective Chief Complaint Information obtained from Patient He is here for left lower extremity wound History of  Present Illness (HPI) 10/26/17-He is here for an initial evaluation for left lower extremity traumatic injury. He states that on approximate 3 weeks ago he injured his leg with a step stool while cleaning his car. He saw Dr. Deforest Hoyles at [facility] on 5/15 who prescribed doxycycline; he had a follow-up on 5/20 when he was referred to the wound clinic and instructed to continue doxycycline and Neosporin. He denies pain, he denies edema. He is nondiabetic and a former smoker, quit in 1968. He does wear compression stockings/wraps. 11/02/17-He is here for follow-up evaluation of the left lower extremity ulcer. There is significant improvement in both measurements and appearance. He completed doxycycline on Saturday. We will change to [facility] and he will follow-up next week 11/09/17- He is here for follow up evaluation of the left lower extremity ulcer. He continues to progress, we will continue with same treatment plan and he will follow up next week 11/16/17-He is here for follow-up evaluation for left lower extremity ulcer. He continues to make progress with improvement and measurement; continues to have red granulation tissue throughout. We will continue with same treatment plan he will follow-up next week 11/23/17-He is here in follow up evaluation for left lower extremity ulcer. He continues to make progress, there is hyper granulation present in the wound today we will switch to [facility] He'll follow-up next week, anticipate he will be healed at that time 11/30/17-He is here for evaluation for left lower extremity wound. This is essentially healed with small area of partial-thickness opening. We'll be closed by next Thursday; I anticipate him to be healed before then. He was asked to call the office on Monday July 8, to inform of if he is healed or needs a follow up on the 11th. Patient History Information obtained from Patient. Family History Heart Disease - Father,Mother, Hypertension  - Mother,Father, No family history of Cancer, Hereditary Spherocytosis, Kidney Disease, Lung Disease, Seizures, Stroke, Thyroid Problems, Tuberculosis. Social History Former smoker - quit 50 years ago, Alcohol Use - Never, Drug Use - No History, Caffeine Use - Daily. Medical And Surgical History Notes Oncologic multiple melanomas Review of Systems (ROS) Constitutional Symptoms (General Health) Denies complaints or symptoms of Fatigue, Fever, Chills. SEVAG, SHEARN ([id number]) Objective Constitutional Vitals Time Taken: 8:04 AM, Height: 69 in, Weight: 170 lbs, BMI: 25.1, Temperature:  98.3 F, Pulse: 67 bpm, Respiratory Rate: 18 breaths/min, Blood Pressure: 157/66 mmHg. Integumentary (Hair, Skin) Wound #1 status is Open. Original cause of wound was Trauma. The wound is located on the Left,Anterior Lower Leg. The wound measures 0.2cm length x 0.3cm width x 0.1cm depth; 0.047cm^2 area and 0.005cm^3 volume. There is Fat Layer (Subcutaneous Tissue) Exposed exposed. There is no tunneling or undermining noted. There is a small amount of serous drainage noted. The wound margin is flat and intact. There is large (67-100%) red, hyper - granulation within the wound bed. There is a small (1-33%) amount of necrotic tissue within the wound bed including Eschar. The periwound skin appearance did not exhibit: Callus, Crepitus, Excoriation, Induration, Rash, Scarring, Dry/Scaly, Maceration, Atrophie Blanche, Cyanosis, Ecchymosis, Hemosiderin Staining, Mottled, Pallor, Rubor, Erythema. Periwound temperature was noted as No Abnormality. Assessment Active Problems ICD-10 Non-pressure chronic ulcer of left calf with fat layer exposed Laceration without foreign body, left lower leg, sequela Plan Wound Cleansing: Wound #1 Left,Anterior Lower Leg: Clean wound with Normal Saline. Secondary Dressing: Wound #1 Left,Anterior Lower Leg: Boardered Foam Dressing Discharge From [facility] Services: Wound #1  Left,Anterior Lower Leg: Discharge from [place] area covered and protected until Monday. Please call us if you have any questions or concerns. Electronic Signature(s) Signed: 11/30/2017 8:29:23 AM By: Thornton Dales, Juliann Pulse ([id number]) Entered By: Lawanda Cousins on 11/30/2017 08:29:23 [name] ([id number]) -------------------------------------------------------------------------------- ROS/PFSH Details Patient Name: [name] Date of Service: 11/30/2017 8:00 AM Medical Record Number: [id number] Patient Account Number: [id number] Date of Birth/Sex: [birth year] (84 y.o. 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, VRE)o Yes Date: 10/19/2017 Have you tested positive for osteomyelitis (bone infection)o No Have you had any tests for circulation on your legso No Have you had other problems associated with your woundso Infection Constitutional Symptoms (General Health) Complaints and Symptoms: Negative for: Fatigue; Fever; Chills Eyes Medical History: Negative for: Cataracts; Glaucoma; Optic Neuritis Ear/Nose/Mouth/Throat Medical History: Negative for: Chronic sinus problems/congestion; Middle ear problems Hematologic/Lymphatic Medical History: Negative for: Anemia; Hemophilia; Human Immunodeficiency Virus; Lymphedema; Sickle Cell Disease Respiratory Medical History: Negative for:  Aspiration; Asthma; Chronic Obstructive Pulmonary Disease (COPD); Pneumothorax; Sleep Apnea; Tuberculosis Cardiovascular Medical History: Positive for: Arrhythmia - a fib; Hypertension Negative for: Angina; Congestive Heart Failure; Coronary Artery Disease; Deep Vein Thrombosis; Hypotension; Myocardial Infarction; Peripheral Arterial Disease; Peripheral Venous Disease; Phlebitis; Vasculitis Gastrointestinal LINC, RENNE ([id number]) Medical History: Negative for: Cirrhosis ; Colitis; Crohnos; Hepatitis A; Hepatitis B; Hepatitis C Endocrine Medical History: Negative for: Type I Diabetes; Type II Diabetes Genitourinary Medical History: Negative for: End Stage Renal Disease Immunological Medical History: Negative for: Lupus Erythematosus; Raynaudos; Scleroderma Integumentary (Skin) Medical History: Negative for: History of Burn; History of pressure wounds Musculoskeletal Medical History: Negative for: Gout; Rheumatoid Arthritis; Osteoarthritis; Osteomyelitis Neurologic Medical History: Negative for: Dementia; Neuropathy; Quadriplegia; Paraplegia; Seizure Disorder Oncologic Medical History: Negative for: Received Chemotherapy; Received Radiation Past Medical History Notes: multiple melanomas Psychiatric Medical History: Negative for:  Anorexia/bulimia; Confinement Anxiety Immunizations Pneumococcal Vaccine: Received Pneumococcal Vaccination: Yes Implantable Devices Family and Social History Cancer: No; Heart Disease: Yes - Father,Mother; Hereditary Spherocytosis: No; Hypertension: Yes - Mother,Father; Kidney Disease: No; Lung Disease: No; Seizures: No; Stroke: No; Thyroid Problems: No; Tuberculosis: No; Former smoker - quit 50 years ago; Alcohol Use: Never; Drug Use: No History; Caffeine Use: Daily; Financial Concerns: No; Food, Clothing or Shelter Needs: No; Support System Lacking: No; Transportation Concerns: No; Advanced Directives: No; Patient does not want information  on [place] ([id number]) I have reviewed and agree with the above information. Electronic Signature(s) Signed: 11/30/2017 9:32:27 PM By: Lawanda Cousins Signed: 12/04/2017 5:11:46 PM By: Alric Quan Entered By: Lawanda Cousins on 11/30/2017 08:28:03 [name] ([id number]) -------------------------------------------------------------------------------- SuperBill Details Patient Name: [name] Date of Service: 11/30/2017 Medical Record Number: [id number] Patient Account Number: [id number] Date of Birth/Sex: August 12, 1928 (84 y.o. M) Treating RN: Ahmed Prima Primary Care Provider: Lavone Orn Other Clinician: Referring Provider: Lavone Orn Treating Provider/Extender: Cathie Olden in Treatment: 5 Diagnosis Coding ICD-10 Codes Code Description [phone] Non-pressure chronic ulcer of left calf with fat layer exposed S81.812S Laceration without foreign body, left lower leg, sequela Facility Procedures CPT4 Code: [id number] Description: 61443 - WOUND CARE VISIT-LEV 3 EST PT Modifier: Quantity: 1 Physician Procedures CPT4 Code: [id number] Description: 76195 - WC PHYS LEVEL 3 - EST PT ICD-10 Diagnosis Description L97.222 Non-pressure chronic ulcer of left calf with fat layer expo S81.812S Laceration without foreign body, left lower leg, sequela Modifier: sed Quantity: 1 Electronic Signature(s) Signed: 11/30/2017 8:33:47 AM By: Alric Quan Signed: 11/30/2017 9:32:27 PM By: Lawanda Cousins Previous Signature: 11/30/2017 8:32:02 AM Version By: Lawanda Cousins Entered By: Alric Quan on 11/30/2017 08:33:46

## 2017-12-08 IMAGING — US US CAROTID DUPLEX BILAT
1 series · 13 of 24 positions shown · non-contrast
Comparison: No prior

CLINICAL DATA: 88-year-old male with a history of type lobe via.

Cardiovascular risk factors include hypertension
EXAM:
BILATERAL CAROTID DUPLEX ULTRASOUND
TECHNIQUE: Gray scale imaging, color Doppler and duplex ultrasound were
performed of bilateral carotid and vertebral arteries in the neck.

[Series 1: us carotid duplex bilat · 0.06mm/px · 13 of 78 slices shown]
[im 1/78]
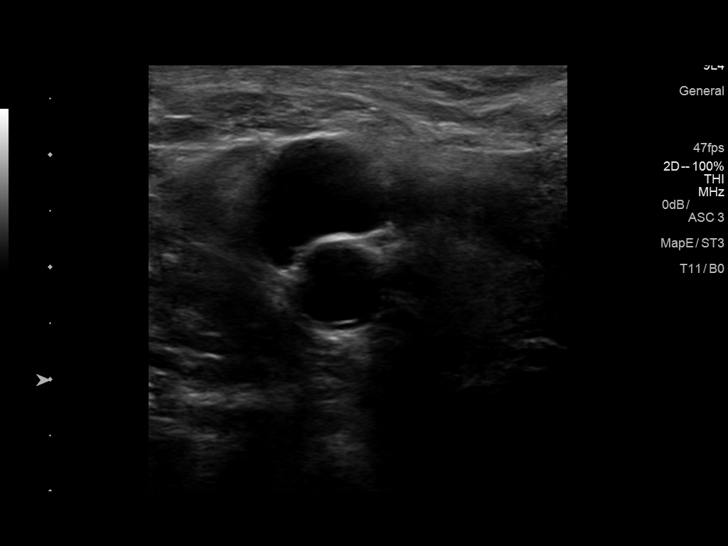
[im 7/78]
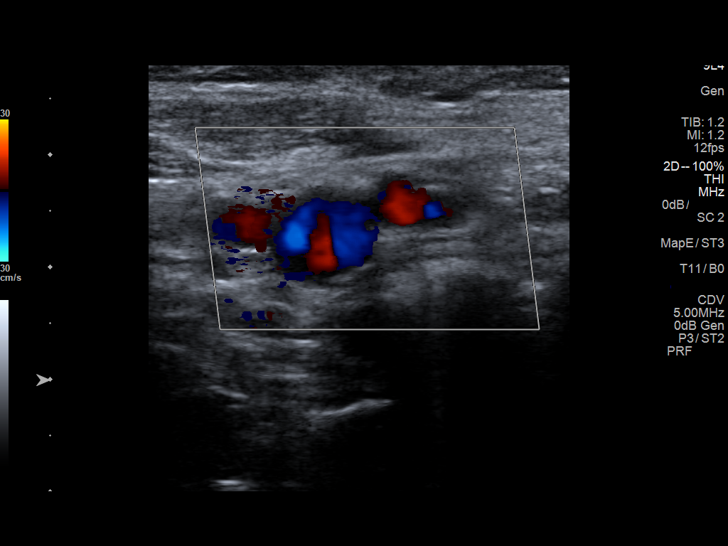
[im 14/78]
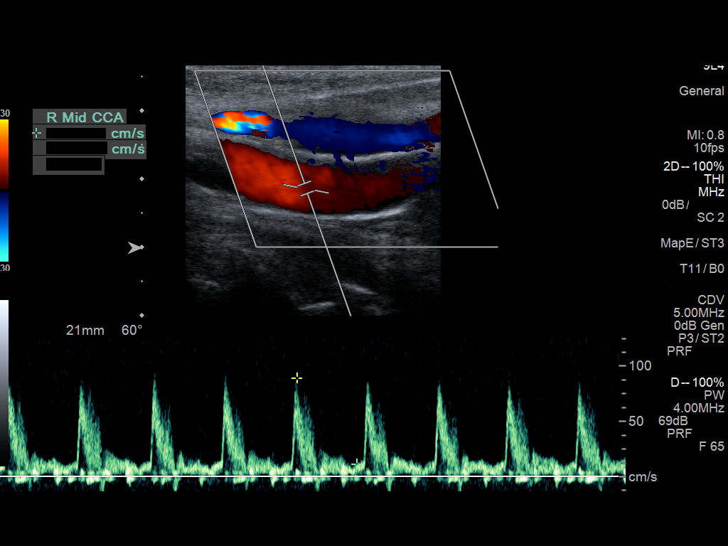
[im 21/78]
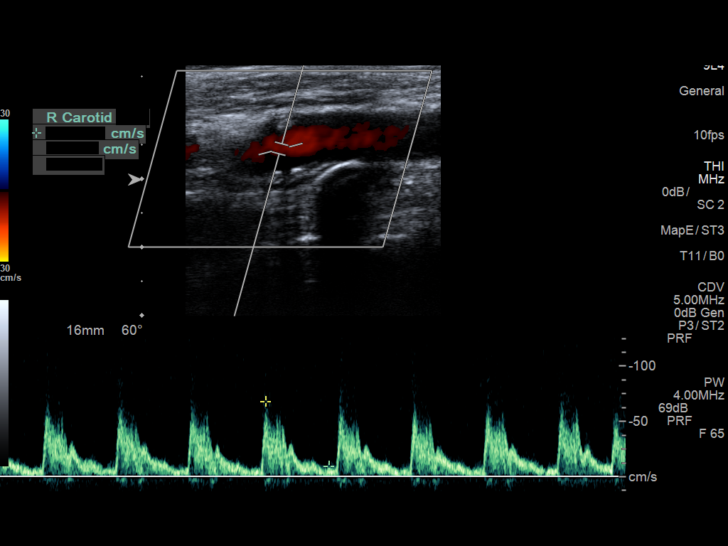
[im 27/78]
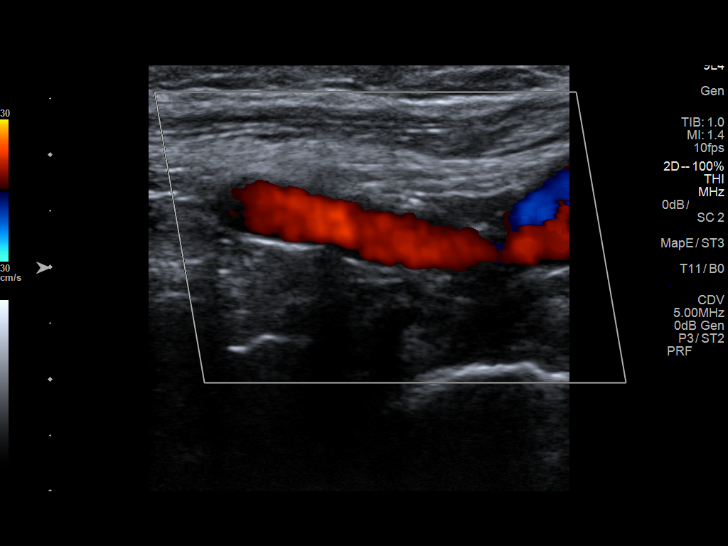
[im 34/78]
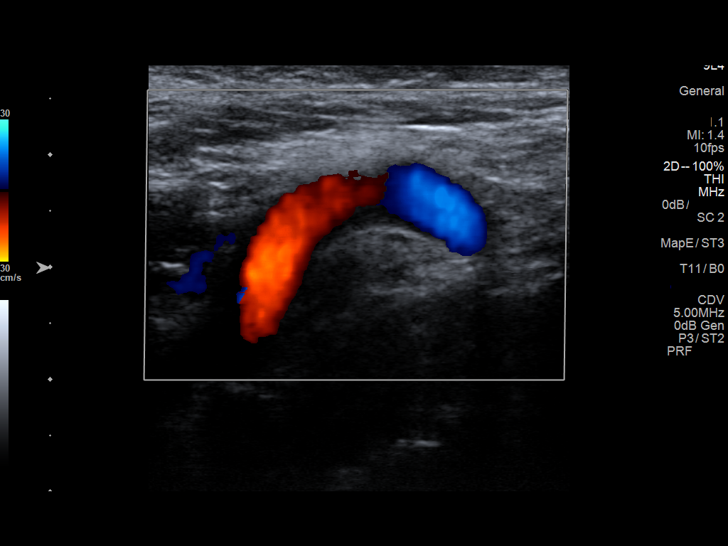
[im 41/78]
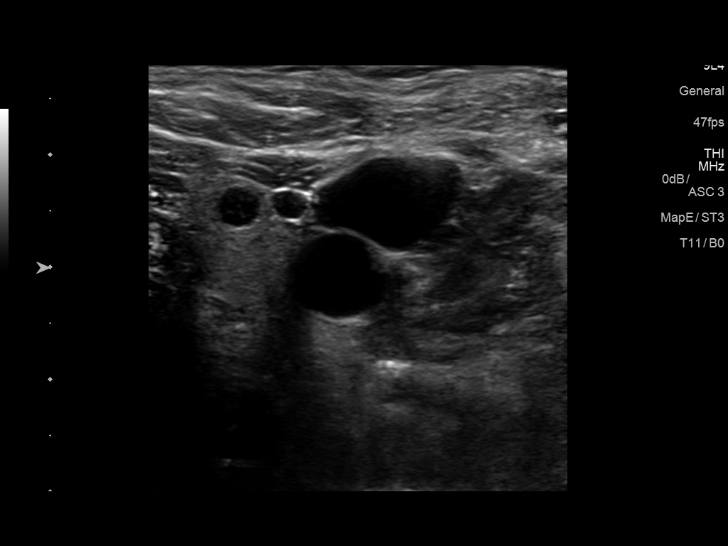
[im 44/78]
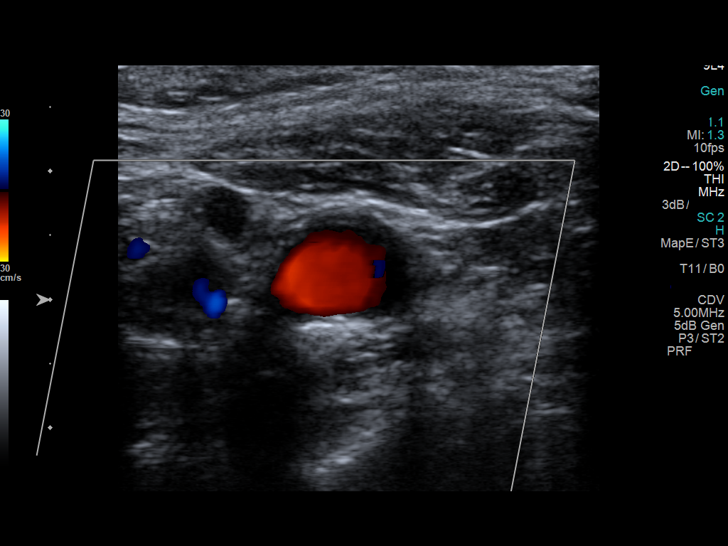
[im 51/78]
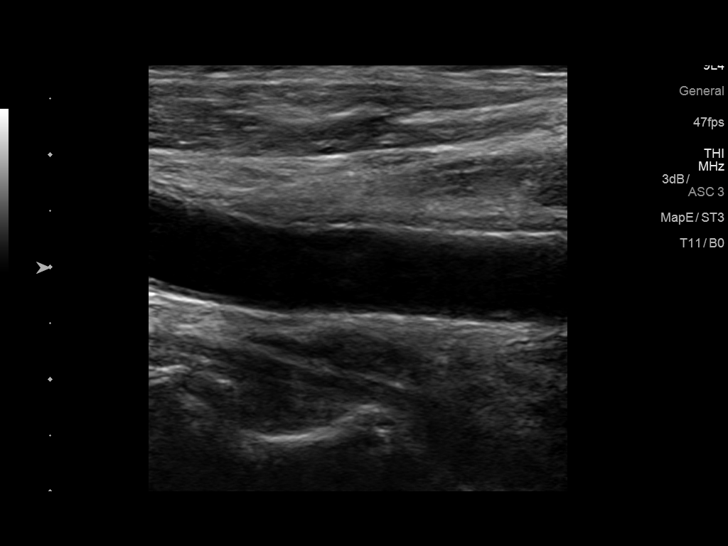
[im 57/78]
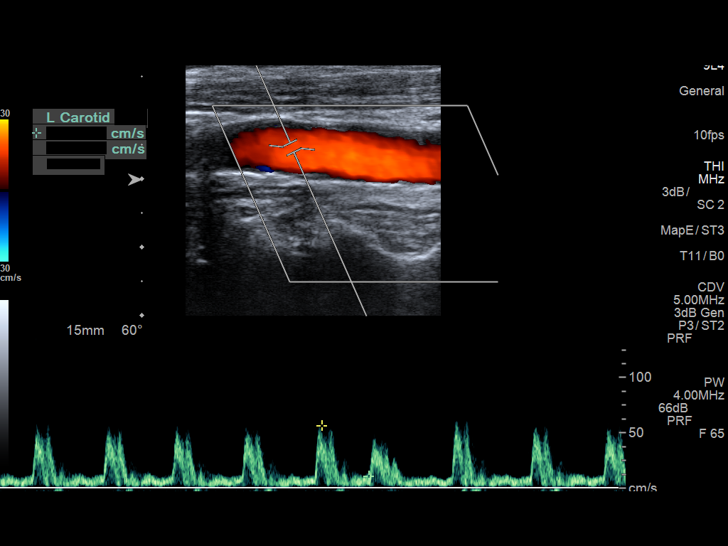
[im 64/78]
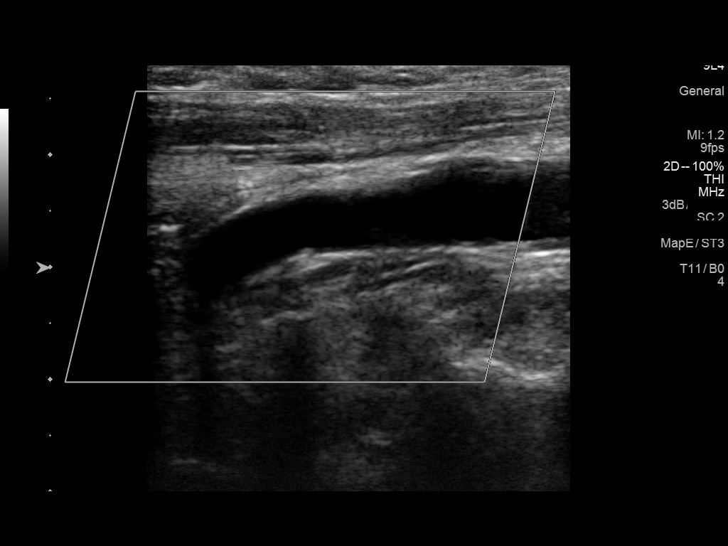
[im 71/78]
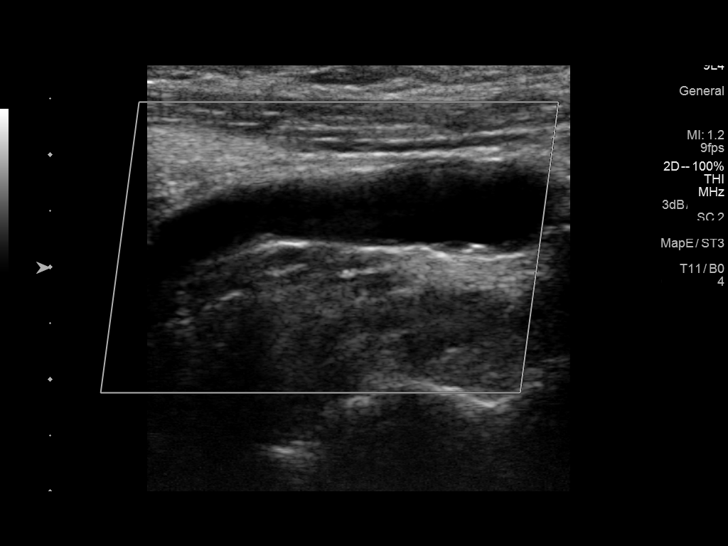
[im 78/78]
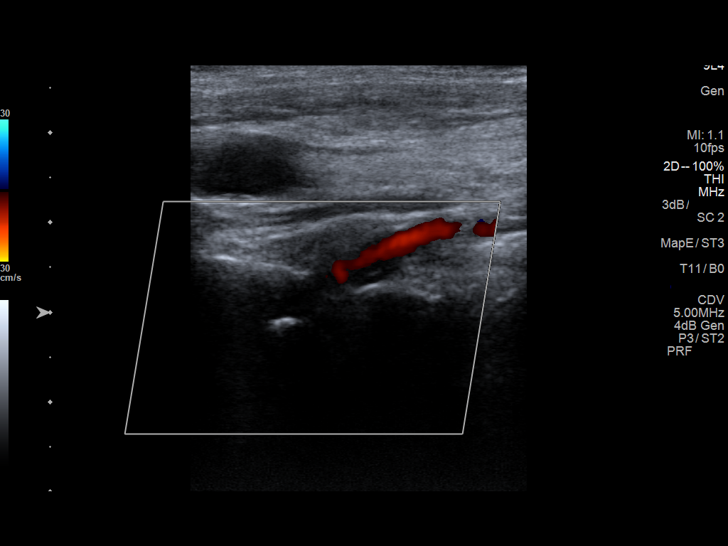

[13 of 24 positions shown; findings below may reference images not displayed]

FINDINGS: Criteria: Quantification of carotid stenosis is based on velocity
parameters that correlate the residual internal carotid diameter
with NASCET-based stenosis levels, using the diameter of the distal
internal carotid lumen as the denominator for stenosis measurement.

The following velocity measurements were obtained:

RIGHT

ICA:  Systolic 73 cm/sec, Diastolic 18 cm/sec

CCA:  124 cm/sec

SYSTOLIC ICA/CCA RATIO:

ECA:  129 cm/sec

LEFT

ICA:  Systolic 67 cm/sec, Diastolic 10 cm/sec

CCA:  112 cm/sec

SYSTOLIC ICA/CCA RATIO:

ECA:  118 cm/sec

Right Brachial SBP: 141

Left Brachial SBP: 150

RIGHT CAROTID ARTERY: No significant calcifications of the right
common carotid artery. Intermediate waveform maintained.
Heterogeneous and partially calcified plaque at the right carotid
bifurcation. No significant lumen shadowing. Low resistance waveform
of the right ICA. Tortuosity

RIGHT VERTEBRAL ARTERY: Antegrade flow with low resistance waveform.

LEFT CAROTID ARTERY: No significant calcifications of the left
common carotid artery. Intermediate waveform maintained.
Heterogeneous and partially calcified plaque at the left carotid
bifurcation without significant lumen shadowing. Low resistance
waveform of the left ICA. Tortuosity

LEFT VERTEBRAL ARTERY:  Antegrade flow with low resistance waveform.
IMPRESSION: Color duplex indicates minimal heterogeneous and calcified plaque,
with no hemodynamically significant stenosis by duplex criteria in
the extracranial cerebrovascular circulation.

## 2017-12-12 DIAGNOSIS — H524 Presbyopia: Secondary | ICD-10-CM | POA: Diagnosis not present

## 2017-12-12 DIAGNOSIS — H40153 Residual stage of open-angle glaucoma, bilateral: Secondary | ICD-10-CM | POA: Diagnosis not present

## 2018-01-22 ENCOUNTER — Ambulatory Visit (INDEPENDENT_AMBULATORY_CARE_PROVIDER_SITE_OTHER): Payer: Medicare Other | Admitting: *Deleted

## 2018-01-22 DIAGNOSIS — I4430 Unspecified atrioventricular block: Secondary | ICD-10-CM

## 2018-01-23 ENCOUNTER — Telehealth: Payer: Self-pay | Admitting: Cardiology

## 2018-01-23 DIAGNOSIS — D225 Melanocytic nevi of trunk: Secondary | ICD-10-CM | POA: Diagnosis not present

## 2018-01-23 DIAGNOSIS — X32XXXD Exposure to sunlight, subsequent encounter: Secondary | ICD-10-CM | POA: Diagnosis not present

## 2018-01-23 DIAGNOSIS — C44329 Squamous cell carcinoma of skin of other parts of face: Secondary | ICD-10-CM | POA: Diagnosis not present

## 2018-01-23 DIAGNOSIS — L570 Actinic keratosis: Secondary | ICD-10-CM | POA: Diagnosis not present

## 2018-01-23 DIAGNOSIS — L820 Inflamed seborrheic keratosis: Secondary | ICD-10-CM | POA: Diagnosis not present

## 2018-01-23 NOTE — Telephone Encounter (Signed)
Spoke with pt and reminded pt of remote transmission that is due today. Pt verbalized understanding.   

## 2018-01-24 ENCOUNTER — Encounter: Payer: Self-pay | Admitting: Cardiology

## 2018-01-24 NOTE — Progress Notes (Signed)
Remote pacemaker transmission.   

## 2018-02-26 LAB — CUP PACEART REMOTE DEVICE CHECK
Brady Statistic AP VS Percent: 1 %
Brady Statistic AS VP Percent: 32 %
Brady Statistic RV Percent Paced: 99 %
Date Time Interrogation Session: 20190821142225
Implantable Lead Implant Date: 20161003
Implantable Pulse Generator Implant Date: 20161003
Lead Channel Impedance Value: 390 Ohm
Lead Channel Pacing Threshold Amplitude: 0.5 V
Lead Channel Sensing Intrinsic Amplitude: 6.4 mV
Lead Channel Setting Pacing Amplitude: 1.5 V
Lead Channel Setting Pacing Pulse Width: 0.4 ms
Lead Channel Setting Sensing Sensitivity: 2 mV
MDC IDC LEAD IMPLANT DT: 20161003
MDC IDC LEAD LOCATION: 753859
MDC IDC LEAD LOCATION: 753860
MDC IDC MSMT BATTERY REMAINING LONGEVITY: 107 mo
MDC IDC MSMT BATTERY REMAINING PERCENTAGE: 95.5 %
MDC IDC MSMT BATTERY VOLTAGE: 2.99 V
MDC IDC MSMT LEADCHNL RA IMPEDANCE VALUE: 410 Ohm
MDC IDC MSMT LEADCHNL RA PACING THRESHOLD AMPLITUDE: 0.5 V
MDC IDC MSMT LEADCHNL RA PACING THRESHOLD PULSEWIDTH: 0.4 ms
MDC IDC MSMT LEADCHNL RA SENSING INTR AMPL: 5 mV
MDC IDC MSMT LEADCHNL RV PACING THRESHOLD PULSEWIDTH: 0.4 ms
MDC IDC SET LEADCHNL RV PACING AMPLITUDE: 2.5 V
MDC IDC STAT BRADY AP VP PERCENT: 68 %
MDC IDC STAT BRADY AS VS PERCENT: 1 %
MDC IDC STAT BRADY RA PERCENT PACED: 68 %
Pulse Gen Model: 2240
Pulse Gen Serial Number: 7818292

## 2018-02-27 DIAGNOSIS — Z85828 Personal history of other malignant neoplasm of skin: Secondary | ICD-10-CM | POA: Diagnosis not present

## 2018-02-27 DIAGNOSIS — C44329 Squamous cell carcinoma of skin of other parts of face: Secondary | ICD-10-CM | POA: Diagnosis not present

## 2018-02-27 DIAGNOSIS — Z08 Encounter for follow-up examination after completed treatment for malignant neoplasm: Secondary | ICD-10-CM | POA: Diagnosis not present

## 2018-03-06 DIAGNOSIS — Z85828 Personal history of other malignant neoplasm of skin: Secondary | ICD-10-CM | POA: Diagnosis not present

## 2018-03-06 DIAGNOSIS — Z85820 Personal history of malignant melanoma of skin: Secondary | ICD-10-CM | POA: Diagnosis not present

## 2018-03-06 DIAGNOSIS — Z08 Encounter for follow-up examination after completed treatment for malignant neoplasm: Secondary | ICD-10-CM | POA: Diagnosis not present

## 2018-03-14 DIAGNOSIS — Z23 Encounter for immunization: Secondary | ICD-10-CM | POA: Diagnosis not present

## 2018-04-12 ENCOUNTER — Encounter: Payer: Self-pay | Admitting: Cardiology

## 2018-04-12 ENCOUNTER — Ambulatory Visit: Payer: Medicare Other | Admitting: Cardiology

## 2018-04-12 VITALS — BP 130/64 | HR 69 | Ht 69.0 in | Wt 168.0 lb

## 2018-04-12 DIAGNOSIS — I480 Paroxysmal atrial fibrillation: Secondary | ICD-10-CM | POA: Diagnosis not present

## 2018-04-12 DIAGNOSIS — I10 Essential (primary) hypertension: Secondary | ICD-10-CM

## 2018-04-12 DIAGNOSIS — I441 Atrioventricular block, second degree: Secondary | ICD-10-CM

## 2018-04-12 NOTE — Patient Instructions (Signed)
Medication Instructions:  Your physician recommends that you continue on your current medications as directed. Please refer to the Current Medication list given to you today.  If you need a refill on your cardiac medications before your next appointment, please call your pharmacy.   Lab work: None ordered  If you have labs (blood work) drawn today and your tests are completely normal, you will receive your results only by: Marland Kitchen MyChart Message (if you have MyChart) OR . A paper copy in the mail If you have any lab test that is abnormal or we need to change your treatment, we will call you to review the results.  Testing/Procedures: None ordered  Follow-Up: Remote monitoring is used to monitor your Pacemaker of ICD from home. This monitoring reduces the number of office visits required to check your device to one time per year. It allows [place] to keep an eye on the functioning of your device to ensure it is working properly. You are scheduled for a device check from home on 04/23/2018. You may send your transmission at any time that day. If you have a wireless device, the transmission will be sent automatically. After your physician reviews your transmission, you will receive a postcard with your next transmission date.  At [facility], you and your health needs are our priority.  As part of our continuing mission to provide you with exceptional heart care, we have created designated Provider Care Teams.  These Care Teams include your primary Cardiologist (physician) and Advanced Practice Providers (APPs -  Physician Assistants and Nurse Practitioners) who all work together to provide you with the care you need, when you need it. You will need a follow up appointment in 1 years.  Please call our office 2 months in advance to schedule this appointment.  You may see Dr. Curt Bears or one of the following Advanced Practice Providers on your designated Care Team:   Chanetta Marshall, NP . Tommye Standard,  PA-C  Thank you for choosing [facility]!!   Trinidad Curet, RN [phone]

## 2018-04-12 NOTE — Progress Notes (Signed)
Electrophysiology Office Note   Date:  04/12/2018   ID:  [name], DOB 1928-06-20, MRN [id number]  PCP:  Lavone Orn, MD  Primary Electrophysiologist:  Marky Buresh Meredith Leeds, MD    No chief complaint on file.    History of Present Illness: [name] is a 84 y.o. male who presents today for electrophysiology evaluation.   He initially presented in September with fatugue and was found to be in 2:1 heart block.  He had a pacemaker placed in October 2016.  He has been doing well without major issues. Is tolerating his medications without problems.   Today, denies symptoms of palpitations, chest pain, shortness of breath, orthopnea, PND, lower extremity edema, claudication, dizziness, presyncope, syncope, bleeding, or neurologic sequela. The patient is tolerating medications without difficulties.  Overall he is feeling well.  He does have a couple episodes of fatigue and sweating.  He feels that this is possibly due to being out in the heat over the summer.   Past Medical History:  Diagnosis Date  . ED (erectile dysfunction)   . Hypertension   . Melanoma ([place])    left abdomen   Past Surgical History:  Procedure Laterality Date  . EP IMPLANTABLE DEVICE N/A 03/09/2015   Procedure: Pacemaker Implant;  Surgeon: Khrystyne Arpin Meredith Leeds, MD;  Location: [facility];  Service: Cardiovascular;  Laterality: N/A;  . NASAL SINUS SURGERY       Current Outpatient Medications  Medication Sig Dispense Refill  . amLODipine (NORVASC) 10 MG tablet Take 10 mg by mouth daily.  3  . cholecalciferol (VITAMIN D) 1000 UNITS tablet Take 1,000 Units by mouth daily.    Marland Kitchen ELIQUIS 5 MG TABS tablet TAKE 1 TABLET BY MOUTH TWICE DAILY 60 tablet 6  . lisinopril (PRINIVIL,ZESTRIL) 20 MG tablet Take 20 mg by mouth daily.  3  . triamcinolone cream (KENALOG) 0.1 % APPLY TO AFFECTED AREAS UP TO TWO TIMES DAILY AS NEEDED. AVOID FACE AND UNDERARMS.  1   No current facility-administered medications for this  visit.     Allergies:   Avelox [moxifloxacin hcl in nacl]; Moxifloxacin; and Sulfa antibiotics   Social History:  The patient  reports that he has quit smoking. He has never used smokeless tobacco. He reports that he drinks alcohol. He reports that he does not use drugs.   Family History:  The patient's family history includes Heart failure in his mother; Hypertension in his father and mother.   ROS:  Please see the history of present illness.   Otherwise, review of systems is positive for months, constipation, back pain, rash, easy bruising.   All other systems are reviewed and negative.   PHYSICAL EXAM: VS:  BP 130/64   Pulse 69   Ht 5\' 9"  (1.753 m)   Wt 168 lb (76.2 kg)   BMI 24.81 kg/m  , BMI Body mass index is 24.81 kg/m. GEN: Well nourished, well developed, in no acute distress  HEENT: normal  Neck: no JVD, carotid bruits, or masses Cardiac: RRR; no murmurs, rubs, or gallops,no edema  Respiratory:  clear to auscultation bilaterally, normal work of breathing GI: soft, nontender, nondistended, + BS MS: no deformity or atrophy  Skin: warm and dry, device site well healed Neuro:  Strength and sensation are intact Psych: euthymic mood, full affect  EKG:  EKG is ordered today. Personal review of the ekg ordered shows sinus rhythm, intermittent atrial and ventricular pacing with intermittent fusion beats  Personal review of the device  interrogation today. Results in [place]: No results found for requested labs within last 8760 hours.    Lipid Panel  No results found for: CHOL, TRIG, HDL, CHOLHDL, VLDL, LDLCALC, LDLDIRECT   Wt Readings from Last 3 Encounters:  04/12/18 168 lb (76.2 kg)  04/25/17 169 lb (76.7 kg)  10/24/16 174 lb 9.6 oz (79.2 kg)   ASSESSMENT AND PLAN:  1.  2:1 AV block: Status post Saint Jude dual-chamber pacemaker implanted 2016.  Device functioning appropriately.  No changes at this time.  2. Hypertension: Blood pressure well  controlled today and has been well controlled at home.  No changes.  3. Paroxysmal atrial fibrillation: Currently on Eliquis without bleeding issues.  No changes.  This patients CHA2DS2-VASc Score and unadjusted Ischemic Stroke Rate (% per year) is equal to 3.2 % stroke rate/year from a score of 3  Above score calculated as 1 point each if present [CHF, HTN, DM, Vascular=MI/PAD/Aortic Plaque, Age if 65-74, or Male] Above score calculated as 2 points each if present [Age > 75, or Stroke/TIA/TE]   Current medicines are reviewed at length with the patient today.   The patient does not have concerns regarding his medicines.  The following changes were made today: None  Labs/ tests ordered today include:  Orders Placed This Encounter  Procedures  . EKG 12-Lead     Disposition:   FU with Aurora Rody 12 months  Signed, Rheana Casebolt Meredith Leeds, MD  04/12/2018 10:07 AM     [facility] [address] [place] [address] [phone] (office) [phone] (fax)

## 2018-04-23 ENCOUNTER — Ambulatory Visit (INDEPENDENT_AMBULATORY_CARE_PROVIDER_SITE_OTHER): Payer: Medicare Other

## 2018-04-23 DIAGNOSIS — I441 Atrioventricular block, second degree: Secondary | ICD-10-CM | POA: Diagnosis not present

## 2018-04-23 NOTE — Progress Notes (Signed)
Remote pacemaker transmission.   

## 2018-04-26 ENCOUNTER — Encounter: Payer: Self-pay | Admitting: Cardiology

## 2018-05-01 DIAGNOSIS — J329 Chronic sinusitis, unspecified: Secondary | ICD-10-CM | POA: Diagnosis not present

## 2018-05-13 ENCOUNTER — Other Ambulatory Visit: Payer: Self-pay | Admitting: Cardiology

## 2018-05-13 DIAGNOSIS — I441 Atrioventricular block, second degree: Secondary | ICD-10-CM

## 2018-06-11 DIAGNOSIS — H40153 Residual stage of open-angle glaucoma, bilateral: Secondary | ICD-10-CM | POA: Diagnosis not present

## 2018-06-12 DIAGNOSIS — D225 Melanocytic nevi of trunk: Secondary | ICD-10-CM | POA: Diagnosis not present

## 2018-06-12 DIAGNOSIS — X32XXXD Exposure to sunlight, subsequent encounter: Secondary | ICD-10-CM | POA: Diagnosis not present

## 2018-06-12 DIAGNOSIS — L820 Inflamed seborrheic keratosis: Secondary | ICD-10-CM | POA: Diagnosis not present

## 2018-06-12 DIAGNOSIS — L570 Actinic keratosis: Secondary | ICD-10-CM | POA: Diagnosis not present

## 2018-06-19 LAB — CUP PACEART REMOTE DEVICE CHECK
Battery Remaining Longevity: 124 mo
Battery Remaining Percentage: 95.5 %
Brady Statistic AP VS Percent: 1.6 %
Brady Statistic AS VP Percent: 25 %
Brady Statistic AS VS Percent: 1.2 %
Date Time Interrogation Session: 20191118070028
Implantable Lead Implant Date: 20161003
Implantable Lead Implant Date: 20161003
Implantable Lead Location: 753859
Implantable Pulse Generator Implant Date: 20161003
Lead Channel Impedance Value: 410 Ohm
Lead Channel Pacing Threshold Amplitude: 0.625 V
Lead Channel Pacing Threshold Pulse Width: 0.4 ms
Lead Channel Sensing Intrinsic Amplitude: 5 mV
Lead Channel Sensing Intrinsic Amplitude: 6.6 mV
Lead Channel Setting Pacing Amplitude: 0.875
Lead Channel Setting Pacing Amplitude: 1.375
Lead Channel Setting Pacing Pulse Width: 0.4 ms
MDC IDC LEAD LOCATION: 753860
MDC IDC MSMT BATTERY VOLTAGE: 2.99 V
MDC IDC MSMT LEADCHNL RA PACING THRESHOLD AMPLITUDE: 0.375 V
MDC IDC MSMT LEADCHNL RA PACING THRESHOLD PULSEWIDTH: 0.4 ms
MDC IDC MSMT LEADCHNL RV IMPEDANCE VALUE: 390 Ohm
MDC IDC SET LEADCHNL RV SENSING SENSITIVITY: 2 mV
MDC IDC STAT BRADY AP VP PERCENT: 70 %
MDC IDC STAT BRADY RA PERCENT PACED: 70 %
MDC IDC STAT BRADY RV PERCENT PACED: 95 %
Pulse Gen Model: 2240
Pulse Gen Serial Number: 7818292

## 2018-07-13 DIAGNOSIS — I441 Atrioventricular block, second degree: Secondary | ICD-10-CM | POA: Diagnosis not present

## 2018-07-13 DIAGNOSIS — J329 Chronic sinusitis, unspecified: Secondary | ICD-10-CM | POA: Diagnosis not present

## 2018-07-13 DIAGNOSIS — I480 Paroxysmal atrial fibrillation: Secondary | ICD-10-CM | POA: Diagnosis not present

## 2018-07-13 DIAGNOSIS — I10 Essential (primary) hypertension: Secondary | ICD-10-CM | POA: Diagnosis not present

## 2018-07-23 ENCOUNTER — Ambulatory Visit (INDEPENDENT_AMBULATORY_CARE_PROVIDER_SITE_OTHER): Payer: Medicare Other

## 2018-07-23 DIAGNOSIS — I4430 Unspecified atrioventricular block: Secondary | ICD-10-CM | POA: Diagnosis not present

## 2018-07-24 ENCOUNTER — Telehealth: Payer: Self-pay

## 2018-07-24 NOTE — Telephone Encounter (Signed)
Spoke with patient to remind of missed remote transmission 

## 2018-07-25 ENCOUNTER — Telehealth: Payer: Self-pay

## 2018-07-25 LAB — CUP PACEART REMOTE DEVICE CHECK
Battery Remaining Longevity: 124 mo
Battery Remaining Percentage: 95.5 %
Battery Voltage: 2.99 V
Brady Statistic AS VS Percent: 1 %
Brady Statistic RV Percent Paced: 96 %
Date Time Interrogation Session: 20200218175750
Implantable Lead Implant Date: 20161003
Implantable Lead Location: 753859
Implantable Pulse Generator Implant Date: 20161003
Lead Channel Impedance Value: 410 Ohm
Lead Channel Pacing Threshold Amplitude: 0.5 V
Lead Channel Pacing Threshold Pulse Width: 0.4 ms
Lead Channel Sensing Intrinsic Amplitude: 5 mV
Lead Channel Setting Pacing Amplitude: 0.875
Lead Channel Setting Sensing Sensitivity: 2 mV
MDC IDC LEAD IMPLANT DT: 20161003
MDC IDC LEAD LOCATION: 753860
MDC IDC MSMT LEADCHNL RA PACING THRESHOLD PULSEWIDTH: 0.4 ms
MDC IDC MSMT LEADCHNL RV IMPEDANCE VALUE: 390 Ohm
MDC IDC MSMT LEADCHNL RV PACING THRESHOLD AMPLITUDE: 0.625 V
MDC IDC MSMT LEADCHNL RV SENSING INTR AMPL: 5 mV
MDC IDC SET LEADCHNL RA PACING AMPLITUDE: 1.5 V
MDC IDC SET LEADCHNL RV PACING PULSEWIDTH: 0.4 ms
MDC IDC STAT BRADY AP VP PERCENT: 74 %
MDC IDC STAT BRADY AP VS PERCENT: 1.9 %
MDC IDC STAT BRADY AS VP PERCENT: 22 %
MDC IDC STAT BRADY RA PERCENT PACED: 74 %
Pulse Gen Model: 2240
Pulse Gen Serial Number: 7818292

## 2018-07-25 NOTE — Telephone Encounter (Signed)
Spoke with patient to remind of missed remote transmission 

## 2018-08-01 NOTE — Progress Notes (Signed)
Remote pacemaker transmission.   

## 2018-10-22 ENCOUNTER — Ambulatory Visit (INDEPENDENT_AMBULATORY_CARE_PROVIDER_SITE_OTHER): Payer: Medicare Other | Admitting: *Deleted

## 2018-10-22 ENCOUNTER — Other Ambulatory Visit: Payer: Self-pay

## 2018-10-22 DIAGNOSIS — I4430 Unspecified atrioventricular block: Secondary | ICD-10-CM

## 2018-10-23 LAB — CUP PACEART REMOTE DEVICE CHECK
Date Time Interrogation Session: 20200519102903
Implantable Lead Implant Date: 20161003
Implantable Lead Implant Date: 20161003
Implantable Lead Location: 753859
Implantable Lead Location: 753860
Implantable Pulse Generator Implant Date: 20161003
Pulse Gen Model: 2240
Pulse Gen Serial Number: 7818292

## 2018-11-01 ENCOUNTER — Encounter: Payer: Self-pay | Admitting: Cardiology

## 2018-11-01 NOTE — Progress Notes (Signed)
Remote pacemaker transmission.   

## 2018-11-27 DIAGNOSIS — Z08 Encounter for follow-up examination after completed treatment for malignant neoplasm: Secondary | ICD-10-CM | POA: Diagnosis not present

## 2018-11-27 DIAGNOSIS — L570 Actinic keratosis: Secondary | ICD-10-CM | POA: Diagnosis not present

## 2018-11-27 DIAGNOSIS — X32XXXD Exposure to sunlight, subsequent encounter: Secondary | ICD-10-CM | POA: Diagnosis not present

## 2018-11-27 DIAGNOSIS — Z85828 Personal history of other malignant neoplasm of skin: Secondary | ICD-10-CM | POA: Diagnosis not present

## 2018-12-22 ENCOUNTER — Other Ambulatory Visit: Payer: Self-pay | Admitting: Cardiology

## 2018-12-22 DIAGNOSIS — I441 Atrioventricular block, second degree: Secondary | ICD-10-CM

## 2018-12-24 NOTE — Telephone Encounter (Signed)
61f 76.2kg Scr 1.08(07/13/18) Lovw/camnitz (04/12/18)

## 2018-12-25 DIAGNOSIS — C4442 Squamous cell carcinoma of skin of scalp and neck: Secondary | ICD-10-CM | POA: Diagnosis not present

## 2018-12-25 DIAGNOSIS — X32XXXD Exposure to sunlight, subsequent encounter: Secondary | ICD-10-CM | POA: Diagnosis not present

## 2018-12-25 DIAGNOSIS — Z08 Encounter for follow-up examination after completed treatment for malignant neoplasm: Secondary | ICD-10-CM | POA: Diagnosis not present

## 2018-12-25 DIAGNOSIS — Z85828 Personal history of other malignant neoplasm of skin: Secondary | ICD-10-CM | POA: Diagnosis not present

## 2018-12-25 DIAGNOSIS — L570 Actinic keratosis: Secondary | ICD-10-CM | POA: Diagnosis not present

## 2018-12-26 DIAGNOSIS — H524 Presbyopia: Secondary | ICD-10-CM | POA: Diagnosis not present

## 2018-12-26 DIAGNOSIS — H40153 Residual stage of open-angle glaucoma, bilateral: Secondary | ICD-10-CM | POA: Diagnosis not present

## 2019-01-21 ENCOUNTER — Ambulatory Visit (INDEPENDENT_AMBULATORY_CARE_PROVIDER_SITE_OTHER): Payer: Medicare Other | Admitting: *Deleted

## 2019-01-21 DIAGNOSIS — I4430 Unspecified atrioventricular block: Secondary | ICD-10-CM

## 2019-01-21 DIAGNOSIS — I480 Paroxysmal atrial fibrillation: Secondary | ICD-10-CM | POA: Diagnosis not present

## 2019-01-21 LAB — CUP PACEART REMOTE DEVICE CHECK
Battery Remaining Longevity: 124 mo
Battery Remaining Percentage: 95.5 %
Battery Voltage: 2.98 V
Brady Statistic AP VP Percent: 75 %
Brady Statistic AP VS Percent: 1 %
Brady Statistic AS VP Percent: 22 %
Brady Statistic AS VS Percent: 1 %
Brady Statistic RA Percent Paced: 75 %
Brady Statistic RV Percent Paced: 98 %
Date Time Interrogation Session: 20200817111102
Implantable Lead Implant Date: 20161003
Implantable Lead Implant Date: 20161003
Implantable Lead Location: 753859
Implantable Lead Location: 753860
Implantable Pulse Generator Implant Date: 20161003
Lead Channel Impedance Value: 380 Ohm
Lead Channel Impedance Value: 410 Ohm
Lead Channel Pacing Threshold Amplitude: 0.5 V
Lead Channel Pacing Threshold Amplitude: 0.625 V
Lead Channel Pacing Threshold Pulse Width: 0.4 ms
Lead Channel Pacing Threshold Pulse Width: 0.4 ms
Lead Channel Sensing Intrinsic Amplitude: 3.6 mV
Lead Channel Sensing Intrinsic Amplitude: 5 mV
Lead Channel Setting Pacing Amplitude: 0.875
Lead Channel Setting Pacing Amplitude: 1.5 V
Lead Channel Setting Pacing Pulse Width: 0.4 ms
Lead Channel Setting Sensing Sensitivity: 2 mV
Pulse Gen Model: 2240
Pulse Gen Serial Number: 7818292

## 2019-01-24 DIAGNOSIS — C4402 Squamous cell carcinoma of skin of lip: Secondary | ICD-10-CM | POA: Diagnosis not present

## 2019-01-25 ENCOUNTER — Other Ambulatory Visit: Payer: Self-pay | Admitting: Cardiology

## 2019-01-25 DIAGNOSIS — I441 Atrioventricular block, second degree: Secondary | ICD-10-CM

## 2019-01-25 NOTE — Telephone Encounter (Signed)
Prescription refill request for Eliquis received.  Last office visit: Dr. Curt Bears (04-12-2018) Scr: 1.08 (07-13-2018 via [place]) Age:  85 y.o. Weight: 76.2 kg  Prescription refill sent.

## 2019-01-28 ENCOUNTER — Telehealth: Payer: Self-pay | Admitting: *Deleted

## 2019-01-28 NOTE — Telephone Encounter (Signed)
Please comment on eliquis. 

## 2019-01-28 NOTE — Telephone Encounter (Signed)
   [facility] Pre-operative Risk Assessment    Request for surgical clearance:  1. What type of surgery is being performed? EXCISIONAL BX OF LOWER LIP   2. When is this surgery scheduled? 02/17/19   3. What type of clearance is required (medical clearance vs. Pharmacy clearance to hold med vs. Both)? BOTH  4. Are there any medications that need to be held prior to surgery and how long? ELIQUIS X 48 HOURS PRIOR   5. Practice name and name of physician performing surgery? [facility]; DR. Darreld Mclean   6. What is your office phone number [phone]    7.   What is your office fax number [phone]  8.   Anesthesia type (None, local, MAC, general) ? LOCAL W/LIMITED AMOUNT OF EPI   [name] 01/28/2019, 3:11 PM  _________________________________________________________________   (provider comments below)

## 2019-01-29 NOTE — Telephone Encounter (Signed)
Spoke to pt wife. Pt surgery is scheduled for 02/07/19, not 9/13 and listed below. Informed pt wife that ok for pt to HOLD Eliquis for 2 days prior to procedure. Informed that pt should not take Eliquis on Tues, Wed, or Thursday prior to procedure. Pt wife verbalized thanks and understanding.

## 2019-01-29 NOTE — Telephone Encounter (Signed)
   Primary Cardiologist: Will Meredith Leeds, MD  Chart reviewed as part of pre-operative protocol coverage. Patient was contacted 01/29/2019 in reference to pre-operative risk assessment for pending surgery as outlined below.  [name] was last seen on 04/12/18 by Dr. Curt Bears  Since that day, [name] has done well. He does not have a history of CAD or MI. His last St Jude PPM transmission on 01/21/19 with normal device function. He reports being able to complete 4.0 METS. He denies anginal symptoms.   Per our clinical pharmacy staff: Pt takes Eliquis for afib with CHADS2VASc score of (age x2, HTN). SCr 1.1 when checked last year. Ok to hold Eliquis for 2 days prior to procedure  Therefore, based on ACC/AHA guidelines, the patient would be at acceptable risk for the planned procedure without further cardiovascular testing.   I will route this recommendation to the requesting party via Epic fax function and remove from pre-op pool.  Please call with questions.  Tami Lin Duke, PA 01/29/2019, 8:41 AM

## 2019-01-29 NOTE — Telephone Encounter (Signed)
Pt takes Eliquis for afib with CHADS2VASc score of (age x2, HTN). SCr 1.1 when checked last year. Ok to hold Eliquis for 2 days prior to procedure.

## 2019-01-29 NOTE — Progress Notes (Signed)
Remote pacemaker transmission.   

## 2019-02-07 DIAGNOSIS — C001 Malignant neoplasm of external lower lip: Secondary | ICD-10-CM | POA: Diagnosis not present

## 2019-02-08 DIAGNOSIS — C001 Malignant neoplasm of external lower lip: Secondary | ICD-10-CM | POA: Diagnosis not present

## 2019-02-08 DIAGNOSIS — K130 Diseases of lips: Secondary | ICD-10-CM | POA: Diagnosis not present

## 2019-02-21 ENCOUNTER — Other Ambulatory Visit: Payer: Self-pay

## 2019-02-21 ENCOUNTER — Encounter: Payer: Medicare Other | Attending: Physician Assistant | Admitting: Physician Assistant

## 2019-02-21 DIAGNOSIS — Z7901 Long term (current) use of anticoagulants: Secondary | ICD-10-CM | POA: Diagnosis not present

## 2019-02-21 DIAGNOSIS — I480 Paroxysmal atrial fibrillation: Secondary | ICD-10-CM | POA: Insufficient documentation

## 2019-02-21 DIAGNOSIS — Z87891 Personal history of nicotine dependence: Secondary | ICD-10-CM | POA: Diagnosis not present

## 2019-02-21 DIAGNOSIS — X58XXXA Exposure to other specified factors, initial encounter: Secondary | ICD-10-CM | POA: Diagnosis not present

## 2019-02-21 DIAGNOSIS — S51812A Laceration without foreign body of left forearm, initial encounter: Secondary | ICD-10-CM | POA: Diagnosis not present

## 2019-02-21 DIAGNOSIS — I10 Essential (primary) hypertension: Secondary | ICD-10-CM | POA: Diagnosis not present

## 2019-02-21 NOTE — Progress Notes (Signed)
[name], [name] ([id number]) Visit Report for 02/21/2019 Abuse/Suicide Risk Screen Details Patient Name: [name], [name] Date of Service: 02/21/2019 9:45 AM Medical Record Number: [id number] Patient Account Number: [id number] Date of Birth/Sex: 07/16/1928 (85 y.o. M) Treating RN: Harold Barban Primary Care Gerrianne Aydelott: Lavone Orn Other Clinician: Referring Jewels Langone: Referral, Self Treating Darrelyn Morro/Extender: Melburn Hake, HOYT Weeks in Treatment: 0 Abuse/Suicide Risk Screen Items Answer ABUSE RISK SCREEN: Has anyone close to you tried to hurt or harm you recentlyo No Do you feel uncomfortable with anyone in your familyo No Has anyone forced you do things that you didnot want to doo No Electronic Signature(s) Signed: 02/21/2019 4:35:07 PM By: Harold Barban Entered By: Harold Barban on 02/21/2019 10:12:08 [name] ([id number]) -------------------------------------------------------------------------------- Activities of Daily Living Details Patient Name: [name] Date of Service: 02/21/2019 9:45 AM Medical Record Number: [id number] Patient Account Number: [id number] Date of Birth/Sex: [birth year] (85 y.o. M) Treating RN: Harold Barban Primary Care Stormy Sabol: Lavone Orn Other Clinician: Referring Stratton Villwock: Referral, Self Treating Lillyahna Hemberger/Extender: Melburn Hake, HOYT Weeks in Treatment: 0 Activities of Daily Living Items Answer Activities of Daily Living (Please select one for each item) Drive Automobile Completely Able Take Medications Completely Able Use Telephone Completely Able Care for Appearance Completely Able Use Toilet Completely Able Bath / Shower Completely Able Dress Self Completely Able Feed Self Completely Able Walk Completely Able Get In / Out Bed Completely Able Housework Completely Able Prepare Meals Completely Able Handle Money Completely Able Shop for Self Completely Able Electronic Signature(s) Signed: 02/21/2019 4:35:07 PM By: Harold Barban Entered By: Harold Barban on 02/21/2019 10:12:19 [name] ([id number]) -------------------------------------------------------------------------------- Education Screening Details Patient Name: [name] Date of Service: 02/21/2019 9:45 AM Medical Record Number: [id number] Patient Account Number: [id number] Date of Birth/Sex: April 08, 1929 (85 y.o. M) Treating RN: Harold Barban Primary Care Nichola Cieslinski: Lavone Orn Other Clinician: Referring Cloie Wooden: Referral, Self Treating Sulema Braid/Extender: Melburn Hake, HOYT Weeks in Treatment: 0 Primary Learner Assessed: Patient Learning Preferences/Education Level/Primary Language Highest Education Level: College or Above Preferred Language: English Cognitive Barrier Language Barrier: No Translator Needed: No Memory Deficit: No Emotional Barrier: No Cultural/Religious Beliefs Affecting Medical Care: No Physical Barrier Impaired Vision: No Impaired Hearing: No Decreased Hand dexterity: No Knowledge/Comprehension Knowledge Level: High Comprehension Level: High Ability to understand written High instructions: Ability to understand verbal High instructions: Motivation Anxiety Level: Calm Cooperation: Cooperative Education Importance: Acknowledges Need Interest in Health Problems: Asks Questions Perception: Coherent Willingness to Engage in Self- High Management Activities: Readiness to Engage in Self- High Management Activities: Electronic Signature(s) Signed: 02/21/2019 4:35:07 PM By: Harold Barban Entered By: Harold Barban on 02/21/2019 10:12:42 [name] ([id number]) -------------------------------------------------------------------------------- Fall Risk Assessment Details Patient Name: [name] Date of Service: 02/21/2019 9:45 AM Medical Record Number: [id number] Patient Account Number: [id number] Date of Birth/Sex: 26-Sep-1928 (85 y.o. M) Treating RN: Harold Barban Primary Care Da Michelle:  Lavone Orn Other Clinician: Referring Raye Slyter: Referral, Self Treating Monesha Monreal/Extender: Melburn Hake, HOYT Weeks in Treatment: 0 Fall Risk Assessment Items Have you had 2 or more falls in the last 12 monthso 0 No Have you had any fall that resulted in injury in the last 12 monthso 0 No FALLS RISK SCREEN History of falling - immediate or within 3 months 25 Yes Secondary diagnosis (Do you have 2 or more medical diagnoseso) 0 No Ambulatory aid None/bed rest/wheelchair/nurse 0 No Crutches/cane/walker 0 No Furniture 0 No Intravenous therapy Access/Saline/Heparin Lock 0 No Gait/Transferring Normal/ bed rest/ wheelchair 0 No Weak (short steps with or without shuffle, stooped but able to lift  head while 0 No walking, may seek support from furniture) Impaired (short steps with shuffle, may have difficulty arising from chair, head 0 No down, impaired balance) Mental Status Oriented to own ability 0 Yes Electronic Signature(s) Signed: 02/21/2019 4:35:07 PM By: Harold Barban Entered By: Harold Barban on 02/21/2019 10:13:00 [name] ([id number]) -------------------------------------------------------------------------------- Foot Assessment Details Patient Name: [name] Date of Service: 02/21/2019 9:45 AM Medical Record Number: [id number] Patient Account Number: [id number] Date of Birth/Sex: 01-29-1929 (85 y.o. M) Treating RN: Harold Barban Primary Care Garan Frappier: Lavone Orn Other Clinician: Referring Marjean Imperato: Referral, Self Treating Tobias Avitabile/Extender: Melburn Hake, HOYT Weeks in Treatment: 0 Foot Assessment Items Site Locations + = Sensation present, - = Sensation absent, C = Callus, U = Ulcer R = Redness, W = Warmth, M = Maceration, PU = Pre-ulcerative lesion F = Fissure, S = Swelling, D = Dryness Assessment Right: Left: Other Deformity: No No Prior Foot Ulcer: No No Prior Amputation: No No Charcot Joint: No No Ambulatory Status: Ambulatory Without  Help Gait: Steady Notes Patients wound Left arm Electronic Signature(s) Signed: 02/21/2019 4:35:07 PM By: Harold Barban Entered By: Harold Barban on 02/21/2019 10:14:10 [name] ([id number]) -------------------------------------------------------------------------------- Nutrition Risk Screening Details Patient Name: [name] Date of Service: 02/21/2019 9:45 AM Medical Record Number: [id number] Patient Account Number: [id number] Date of Birth/Sex: 25-Aug-1928 (85 y.o. M) Treating RN: Harold Barban Primary Care Author [place]: Lavone Orn Other Clinician: Referring Monte Bronder: Referral, Self Treating Jabir Dahlem/Extender: Melburn Hake, HOYT Weeks in Treatment: 0 Height (in): 70 Weight (lbs): 155 Body Mass Index (BMI): 22.2 Nutrition Risk Screening Items Score Screening NUTRITION RISK SCREEN: I have an illness or condition that made me change the kind and/or amount of 0 No food I eat I eat fewer than two meals per day 0 No I eat few fruits and vegetables, or milk products 0 No I have three or more drinks of beer, liquor or wine almost every day 0 No I have tooth or mouth problems that make it hard for me to eat 0 No I don't always have enough money to buy the food I need 0 No I eat alone most of the time 0 No I take three or more different prescribed or over-the-counter drugs a day 1 Yes Without wanting to, I have lost or gained 10 pounds in the last six months 0 No I am not always physically able to shop, cook and/or feed myself 0 No Nutrition Protocols Good Risk Protocol Moderate Risk Protocol High Risk Proctocol Risk Level: Good Risk Score: 1 Electronic Signature(s) Signed: 02/21/2019 4:35:07 PM By: Harold Barban Entered By: Harold Barban on 02/21/2019 10:13:49

## 2019-02-21 NOTE — Progress Notes (Addendum)
JAIMEY, MCWAIN ([id number]) Visit Report for 02/21/2019 Allergy List Details Patient Name: [name], [name] Date of Service: 02/21/2019 9:45 AM Medical Record Number: [id number] Patient Account Number: [id number] Date of Birth/Sex: 13-Jul-1928 (85 y.o. M) Treating RN: Harold Barban Primary Care Malaya Cagley: Lavone Orn Other Clinician: Referring Keven Osborn: Referral, Self Treating Anne-Marie Genson/Extender: Melburn Hake, HOYT Weeks in Treatment: 0 Allergies Active Allergies Septra Avelox Reaction: rash face in 2007 Severity: Moderate Allergy Notes Electronic Signature(s) Signed: 02/21/2019 4:35:07 PM By: Harold Barban Entered By: Harold Barban on 02/21/2019 10:07:22 [name] ([id number]) -------------------------------------------------------------------------------- Arrival Information Details Patient Name: [name] Date of Service: 02/21/2019 9:45 AM Medical Record Number: [id number] Patient Account Number: [id number] Date of Birth/Sex: Jun 04, 1929 (85 y.o. M) Treating RN: Harold Barban Primary Care Kaylena Pacifico: Lavone Orn Other Clinician: Referring Dink Creps: Referral, Self Treating Kennetha Pearman/Extender: Melburn Hake, HOYT Weeks in Treatment: 0 Visit Information Patient Arrived: Ambulatory Arrival Time: 10:02 Accompanied By: self Transfer Assistance: None Patient Identification Verified: Yes Secondary Verification Process Completed: Yes History Since Last Visit Added or deleted any medications: No Any new allergies or adverse reactions: No Had a fall or experienced change in activities of daily living that may affect risk of falls: No Signs or symptoms of abuse/neglect since last visito No Has Dressing in Place as Prescribed: Yes Electronic Signature(s) Signed: 02/21/2019 4:35:07 PM By: Harold Barban Entered By: Harold Barban on 02/21/2019 10:04:41 [name] ([id number]) -------------------------------------------------------------------------------- Clinic Level of  Care Assessment Details Patient Name: [name] Date of Service: 02/21/2019 9:45 AM Medical Record Number: [id number] Patient Account Number: [id number] Date of Birth/Sex: 1929/02/11 (85 y.o. M) Treating RN: Army Melia Primary Care Roschelle Calandra: Lavone Orn Other Clinician: Referring Ahsha Hinsley: Referral, Self Treating Domanik Rainville/Extender: Melburn Hake, HOYT Weeks in Treatment: 0 Clinic Level of Care Assessment Items TOOL 1 Quantity Score []  - Use when EandM and Procedure is performed on INITIAL visit 0 ASSESSMENTS - Nursing Assessment / Reassessment X - General Physical Exam (combine w/ comprehensive assessment (listed just below) when 1 20 performed on new pt. evals) X- 1 25 Comprehensive Assessment (HX, ROS, Risk Assessments, Wounds Hx, etc.) ASSESSMENTS - Wound and Skin Assessment / Reassessment []  - Dermatologic / Skin Assessment (not related to wound area) 0 ASSESSMENTS - Ostomy and/or Continence Assessment and Care []  - Incontinence Assessment and Management 0 []  - 0 Ostomy Care Assessment and Management (repouching, etc.) PROCESS - Coordination of Care X - Simple Patient / Family Education for ongoing care 1 15 []  - 0 Complex (extensive) Patient / Family Education for ongoing care X- 1 10 Staff obtains Programmer, systems, Records, Test Results / Process Orders []  - 0 Staff telephones HHA, Nursing Homes / Clarify orders / etc []  - 0 Routine Transfer to another Facility (non-emergent condition) []  - 0 Routine Hospital Admission (non-emergent condition) X- 1 15 New Admissions / Biomedical engineer / Ordering NPWT, Apligraf, etc. []  - 0 Emergency Hospital Admission (emergent condition) PROCESS - Special Needs []  - Pediatric / Minor Patient Management 0 []  - 0 Isolation Patient Management []  - 0 Hearing / Language / Visual special needs []  - 0 Assessment of Community assistance (transportation, D/C planning, etc.) []  - 0 Additional assistance / Altered mentation []  -  0 Support Surface(s) Assessment (bed, cushion, seat, etc.) [name], [name] ([id number]) INTERVENTIONS - Miscellaneous []  - External ear exam 0 []  - 0 Patient Transfer (multiple staff / Civil Service fast streamer / Similar devices) []  - 0 Simple Staple / Suture removal (25 or less) []  - 0 Complex Staple / Suture removal (26 or more) []  -  0 Hypo/Hyperglycemic Management (do not check if billed separately) []  - 0 Ankle / Brachial Index (ABI) - do not check if billed separately Has the patient been seen at the hospital within the last three years: Yes Total Score: 85 Level Of Care: New/Established - Level 3 Electronic Signature(s) Signed: 02/21/2019 11:30:00 AM By: Army Melia Entered By: Army Melia on 02/21/2019 10:34:04 [name] ([id number]) -------------------------------------------------------------------------------- Encounter Discharge Information Details Patient Name: [name] Date of Service: 02/21/2019 9:45 AM Medical Record Number: [id number] Patient Account Number: [id number] Date of Birth/Sex: 05/05/1929 (85 y.o. M) Treating RN: Army Melia Primary Care Arnita Koons: Lavone Orn Other Clinician: Referring Yahya Boldman: Referral, Self Treating Ladislaus Repsher/Extender: Melburn Hake, HOYT Weeks in Treatment: 0 Encounter Discharge Information Items Post Procedure Vitals Discharge Condition: Stable Temperature (F): 98.3 Ambulatory Status: Ambulatory Pulse (bpm): 70 Discharge Destination: Home Respiratory Rate (breaths/min): 16 Transportation: Private Auto Blood Pressure (mmHg): 150/77 Accompanied By: self Schedule Follow-up Appointment: Yes Clinical Summary of Care: Electronic Signature(s) Signed: 02/21/2019 11:30:00 AM By: Army Melia Entered By: Army Melia on 02/21/2019 10:35:31 [name] ([id number]) -------------------------------------------------------------------------------- Lower Extremity Assessment Details Patient Name: [name] Date of Service: 02/21/2019  9:45 AM Medical Record Number: [id number] Patient Account Number: [id number] Date of Birth/Sex: 01-28-1929 (85 y.o. M) Treating RN: Harold Barban Primary Care Brandt Chaney: Lavone Orn Other Clinician: Referring Herma Uballe: Referral, Self Treating Gaylene Moylan/Extender: Melburn Hake, HOYT Weeks in Treatment: 0 Notes Patients wound Left arm Electronic Signature(s) Signed: 02/21/2019 4:35:07 PM By: Harold Barban Entered By: Harold Barban on 02/21/2019 10:14:34 [name] ([id number]) -------------------------------------------------------------------------------- Multi Wound Chart Details Patient Name: [name] Date of Service: 02/21/2019 9:45 AM Medical Record Number: [id number] Patient Account Number: [id number] Date of Birth/Sex: 05-17-1929 (85 y.o. M) Treating RN: Army Melia Primary Care Choua Ikner: Lavone Orn Other Clinician: Referring Wilkin Lippy: Referral, Self Treating Afrika Brick/Extender: Melburn Hake, HOYT Weeks in Treatment: 0 Vital Signs Height(in): 70 Pulse(bpm): 70 Weight(lbs): 155 Blood Pressure(mmHg): 150/77 Body Mass Index(BMI): 22 Temperature(F): 98.3 Respiratory Rate 016 (breaths/min): Photos: [N/A:N/A] Wound Location: Left Forearm - Dorsal N/A N/A Wounding Event: Trauma N/A N/A Primary Etiology: Trauma, Other N/A N/A Comorbid History: Arrhythmia, Hypertension N/A N/A Date Acquired: 02/06/2019 N/A N/A Weeks of Treatment: 0 N/A N/A Wound Status: Open N/A N/A Measurements L x W x D 2.3x2x0.1 N/A N/A (cm) Area (cm) : 3.613 N/A N/A Volume (cm) : 0.361 N/A N/A % Reduction in Area: 0.00% N/A N/A % Reduction in Volume: 0.00% N/A N/A Classification: Partial Thickness N/A N/A Exudate Amount: Small N/A N/A Exudate Type: Serous N/A N/A Exudate Color: amber N/A N/A Wound Margin: Flat and Intact N/A N/A Granulation Amount: Medium (34-66%) N/A N/A Granulation Quality: Pink N/A N/A Necrotic Amount: Small (1-33%) N/A N/A Exposed Structures: Fat Layer  (Subcutaneous N/A N/A Tissue) Exposed: Yes Fascia: No Tendon: No Muscle: No Joint: No Bone: No [name], [name] ([id number]) Treatment Notes Electronic Signature(s) Signed: 02/21/2019 11:30:00 AM By: Army Melia Entered By: Army Melia on 02/21/2019 10:28:05 [name] ([id number]) -------------------------------------------------------------------------------- [place] Details Patient Name: [name] Date of Service: 02/21/2019 9:45 AM Medical Record Number: [id number] Patient Account Number: [id number] Date of Birth/Sex: 1929-05-31 (85 y.o. M) Treating RN: Army Melia Primary Care Tynesha Free: Lavone Orn Other Clinician: Referring Lexys Milliner: Referral, Self Treating Wynston Romey/Extender: Sharalyn Ink in Treatment: 0 Active Inactive Electronic Signature(s) Signed: 03/14/2019 5:07:55 PM By: Gretta Cool, BSN, RN, CWS, Kim RN, BSN Signed: 03/25/2019 8:10:25 AM By: Army Melia Previous Signature: 02/21/2019 11:30:00 AM Version By: Army Melia Entered By: Gretta Cool BSN, RN, CWS, Kim on  03/14/2019 17:07:55 [name], [name] ([id number]) -------------------------------------------------------------------------------- Pain Assessment Details Patient Name: [name], [name] Date of Service: 02/21/2019 9:45 AM Medical Record Number: [id number] Patient Account Number: [id number] Date of Birth/Sex: [birth year] (85 y.o. M) Treating RN: Harold Barban Primary Care Deeana Atwater: Lavone Orn Other Clinician: Referring Frandy Basnett: Referral, Self Treating Sheresa Cullop/Extender: Melburn Hake, HOYT Weeks in Treatment: 0 Active Problems Location of Pain Severity and Description of Pain Patient Has Paino No Site Locations Pain Management and Medication Current Pain Management: Electronic Signature(s) Signed: 02/21/2019 4:35:07 PM By: Harold Barban Entered By: Harold Barban on 02/21/2019 10:05:13 [name]  ([id number]) -------------------------------------------------------------------------------- Patient/Caregiver Education Details Patient Name: [name] Date of Service: 02/21/2019 9:45 AM Medical Record Number: [id number] Patient Account Number: [id number] Date of Birth/Gender: 05-30-1929 (85 y.o. M) Treating RN: Army Melia Primary Care Physician: Lavone Orn Other Clinician: Referring Physician: Referral, Self Treating Physician/Extender: Sharalyn Ink in Treatment: 0 Education Assessment Education Provided To: Patient Education Topics Provided Wound/Skin Impairment: Handouts: Caring for Your Ulcer Methods: Demonstration, Explain/Verbal Responses: State content correctly Electronic Signature(s) Signed: 02/21/2019 11:30:00 AM By: Army Melia Entered By: Army Melia on 02/21/2019 10:34:33 [name] ([id number]) -------------------------------------------------------------------------------- Wound Assessment Details Patient Name: [name] Date of Service: 02/21/2019 9:45 AM Medical Record Number: [id number] Patient Account Number: [id number] Date of Birth/Sex: [birth year] (85 y.o. M) Treating RN: Harold Barban Primary Care Dayshia Ballinas: Lavone Orn Other Clinician: Referring Falcon Mccaskey: Referral, Self Treating Carla Whilden/Extender: Melburn Hake, HOYT Weeks in Treatment: 0 Wound Status Wound Number: 2 Primary Etiology: Trauma, Other Wound Location: Left Forearm - Dorsal Wound Status: Open Wounding Event: Trauma Comorbid History: Arrhythmia, Hypertension Date Acquired: 02/06/2019 Weeks Of Treatment: 0 Clustered Wound: No Photos Wound Measurements Length: (cm) 2.3 % Reduction Width: (cm) 2 % Reduction Depth: (cm) 0.1 Tunneling: Area: (cm) 3.613 Undermining Volume: (cm) 0.361 in Area: 0% in Volume: 0% No : No Wound Description Classification: Partial Thickness Foul Odor Af Wound Margin: Flat and Intact Slough/Fibri Exudate Amount: Small Exudate  Type: Serous Exudate Color: amber ter Cleansing: No no Yes Wound Bed Granulation Amount: Medium (34-66%) Exposed Structure Granulation Quality: Pink Fascia Exposed: No Necrotic Amount: Small (1-33%) Fat Layer (Subcutaneous Tissue) Exposed: Yes Necrotic Quality: Adherent Slough Tendon Exposed: No Muscle Exposed: No Joint Exposed: No Bone Exposed: No Electronic Signature(s) [name], [name] ([id number]) Signed: 02/21/2019 4:35:07 PM By: Harold Barban Entered By: Harold Barban on 02/21/2019 10:19:24 [name] ([id number]) -------------------------------------------------------------------------------- Vitals Details Patient Name: [name] Date of Service: 02/21/2019 9:45 AM Medical Record Number: [id number] Patient Account Number: [id number] Date of Birth/Sex: 10-08-1928 (85 y.o. M) Treating RN: Harold Barban Primary Care Akane Tessier: Lavone Orn Other Clinician: Referring Marcela Alatorre: Referral, Self Treating Jaja Switalski/Extender: Melburn Hake, HOYT Weeks in Treatment: 0 Vital Signs Time Taken: 10:05 Temperature (F): 98.3 Height (in): 70 Pulse (bpm): 70 Source: Stated Respiratory Rate (breaths/min): 016 Weight (lbs): 155 Blood Pressure (mmHg): 150/77 Source: Stated Reference Range: 80 - 120 mg / dl Body Mass Index (BMI): 22.2 Electronic Signature(s) Signed: 02/21/2019 4:35:07 PM By: Harold Barban Entered By: Harold Barban on 02/21/2019 10:05:44

## 2019-02-21 NOTE — Progress Notes (Signed)
Geoffrey, ARCENEAUX ([id number]) Visit Report for 02/21/2019 Chief Complaint Document Details Patient Name: [name], [name] Date of Service: 02/21/2019 9:45 AM Medical Record Number: [id number] Patient Account Number: [id number] Date of Birth/Sex: 09-02-1928 (85 y.o. M) Treating RN: Army Melia Primary Care Provider: Lavone Orn Other Clinician: Referring Provider: Referral, Self Treating Provider/Extender: Melburn Hake, HOYT Weeks in Treatment: 0 Information Obtained from: Patient Chief Complaint Left forearm skin tear Electronic Signature(s) Signed: 02/21/2019 10:26:21 AM By: Worthy Keeler PA-C Entered By: Worthy Keeler on 02/21/2019 10:26:20 [name] ([id number]) -------------------------------------------------------------------------------- Debridement Details Patient Name: [name] Date of Service: 02/21/2019 9:45 AM Medical Record Number: [id number] Patient Account Number: [id number] Date of Birth/Sex: 04-22-1929 (85 y.o. M) Treating RN: Army Melia Primary Care Provider: Lavone Orn Other Clinician: Referring Provider: Referral, Self Treating Provider/Extender: Melburn Hake, HOYT Weeks in Treatment: 0 Debridement Performed for Wound #2 Left,Dorsal Forearm Assessment: Performed By: Physician STONE III, HOYT E., PA-C Debridement Type: Debridement Level of Consciousness (Pre- Awake and Alert procedure): Pre-procedure Verification/Time Yes - 10:29 Out Taken: Start Time: 10:30 Pain Control: Lidocaine Total Area Debrided (L x W): 2.3 (cm) x 2 (cm) = 4.6 (cm) Tissue and other material Subcutaneous, Skin: Dermis debrided: Level: Skin/Subcutaneous Tissue Debridement Description: Excisional Instrument: Scissors Bleeding: Minimum Hemostasis Achieved: Pressure End Time: 10:30 Response to Treatment: Procedure was tolerated well Level of Consciousness Awake and Alert (Post-procedure): Post Debridement Measurements of Total Wound Length: (cm) 2.3 Width: (cm)  2 Depth: (cm) 0.1 Volume: (cm) 0.361 Character of Wound/Ulcer Post Debridement: Stable Post Procedure Diagnosis Same as Pre-procedure Electronic Signature(s) Signed: 02/21/2019 11:30:00 AM By: Army Melia Signed: 02/21/2019 12:06:15 PM By: Worthy Keeler PA-C Entered By: Army Melia on 02/21/2019 10:30:54 [name] ([id number]) -------------------------------------------------------------------------------- HPI Details Patient Name: [name] Date of Service: 02/21/2019 9:45 AM Medical Record Number: [id number] Patient Account Number: [id number] Date of Birth/Sex: [birth year] (85 y.o. M) Treating RN: Army Melia Primary Care Provider: Lavone Orn Other Clinician: Referring Provider: Referral, Self Treating Provider/Extender: Melburn Hake, HOYT Weeks in Treatment: 0 History of Present Illness HPI Description: 10/26/17-He is here for an initial evaluation for left lower extremity traumatic injury. He states that on approximate 3 weeks ago he injured his leg with a step stool while cleaning his car. He saw Dr. Deforest Hoyles at [facility] on 5/15 who prescribed doxycycline; he had a follow-up on 5/20 when he was referred to the wound clinic and instructed to continue doxycycline and Neosporin. He denies pain, he denies edema. He is nondiabetic and a former smoker, quit in 1968. He does wear compression stockings/wraps. 11/02/17-He is here for follow-up evaluation of the left lower extremity ulcer. There is significant improvement in both measurements and appearance. He completed doxycycline on Saturday. We will change to [facility] and he will follow-up next week 11/09/17- He is here for follow up evaluation of the left lower extremity ulcer. He continues to progress, we will continue with same treatment plan and he will follow up next week 11/16/17-He is here for follow-up evaluation for left lower extremity ulcer. He continues to make progress with improvement and measurement;  continues to have red granulation tissue throughout. We will continue with same treatment plan he will follow-up next week 11/23/17-He is here in follow up evaluation for left lower extremity ulcer. He continues to make progress, there is hyper granulation present in the wound today we will switch to [facility]. He'll follow-up next week, anticipate he will be healed at that time 11/30/17-He is here for evaluation for left  lower extremity wound. This is essentially healed with small area of partial-thickness opening. We'll be closed by next Thursday; I anticipate him to be healed before then. He was asked to call the office on Monday July 8, to inform of if he is healed or needs a follow up on the 11th. Readmission: 02/21/2019 on evaluation today patient actually appears to be doing quite well with regard to his skin tear on the left forearm. He tells me this actually occurred about a week ago when he was in his garage and had his arm over an aluminum chair. Subsequently he had a fly landed on his leg and he went to squat it moving quickly and caused a skin tear on his forearm. Since that time he has been tolerating the dressing changes without complication he did use some Hydrofera Blue but unfortunately this seemed to get somewhat stuck. He did trim off a portion of the skin as well that was thin and rolling under. That seems to have helped with the healing as well and there is a lot of new epithelization which is great news. Overall I am rather pleased with the way things appear currently. No fevers, chills, nausea, vomiting, or diarrhea. The patient is on Eliquis and tells me he did stop that on Monday in preparation for me potentially performing some type of debridement here in the office today. Electronic Signature(s) Signed: 02/21/2019 11:57:19 AM By: Worthy Keeler PA-C Entered By: Worthy Keeler on 02/21/2019 11:57:19 [name]  ([id number]) -------------------------------------------------------------------------------- Physical Exam Details Patient Name: [name] Date of Service: 02/21/2019 9:45 AM Medical Record Number: [id number] Patient Account Number: [id number] Date of Birth/Sex: May 11, 1929 (85 y.o. M) Treating RN: Army Melia Primary Care Provider: Lavone Orn Other Clinician: Referring Provider: Referral, Self Treating Provider/Extender: STONE III, HOYT Weeks in Treatment: 0 Constitutional patient is hypertensive.. pulse regular and within target range for patient.Marland Kitchen respirations regular, non-labored and within target range for patient.Marland Kitchen temperature within target range for patient.. Well-nourished and well-hydrated in no acute distress. Eyes conjunctiva clear no eyelid edema noted. pupils equal round and reactive to light and accommodation. Ears, Nose, Mouth, and Throat no gross abnormality of ear auricles or external auditory canals. normal hearing noted during conversation. mucus membranes moist. Respiratory normal breathing without difficulty. clear to auscultation bilaterally. Cardiovascular regular rate and rhythm with normal S1, S2. no clubbing, cyanosis, significant edema, <3 sec cap refill. Gastrointestinal (GI) soft, non-tender, non-distended, +BS. no ventral hernia noted. Musculoskeletal normal gait and posture. no significant deformity or arthritic changes, no loss or range of motion, no clubbing. Psychiatric this patient is able to make decisions and demonstrates good insight into disease process. Alert and Oriented x 3. pleasant and cooperative. Notes Patient's wound bed currently showed signs of one very small area of epiboly which subsequently did need to be trimmed away this is where the tissue rolled over on itself and is not healing well at the base. I believe if we trim this off carefully that will likely allow the area to heal more effectively without any complications. He  was in agreement with this. Therefore I did go ahead and using scissors and forceps to trim away the very small portion of skin that was rolled under causing this area not to re-adhere appropriately. He tolerated that he tells me with barely any discomfort and post debridement wound bed appears to be doing excellent stasis was achieved with pressure. Electronic Signature(s) Signed: 02/21/2019 11:58:14 AM By: Worthy Keeler PA-C Entered By:  Worthy Keeler on 02/21/2019 11:58:13 CILLIAN, VALLEE ([id number]) -------------------------------------------------------------------------------- Physician Orders Details Patient Name: TREYQUAN, LAMBERTSON Date of Service: 02/21/2019 9:45 AM Medical Record Number: [id number] Patient Account Number: [id number] Date of Birth/Sex: [birth year] (85 y.o. M) Treating RN: Army Melia Primary Care Provider: Lavone Orn Other Clinician: Referring Provider: Referral, Self Treating Provider/Extender: Melburn Hake, HOYT Weeks in Treatment: 0 Verbal / Phone Orders: No Diagnosis Coding ICD-10 Coding Code Description S51.802A Unspecified open wound of left forearm, initial encounter I48.0 Paroxysmal atrial fibrillation Wound Cleansing Wound #2 Left,Dorsal Forearm o Clean wound with Normal Saline. Primary Wound Dressing Wound #2 Left,Dorsal Forearm o Xeroform Secondary Dressing Wound #2 Left,Dorsal Forearm o Other - telfa non-adherent pad, secure with stretch net Dressing Change Frequency Wound #2 Left,Dorsal Forearm o Change dressing every day. Follow-up Appointments Wound #2 Left,Dorsal Forearm o Return Appointment in 1 week. Electronic Signature(s) Signed: 02/21/2019 11:30:00 AM By: Army Melia Signed: 02/21/2019 12:06:15 PM By: Worthy Keeler PA-C Entered By: Army Melia on 02/21/2019 10:33:37 [name] ([id number]) -------------------------------------------------------------------------------- Problem List Details Patient Name: [name] Date of Service: 02/21/2019 9:45 AM Medical Record Number: [id number] Patient Account Number: [id number] Date of Birth/Sex: Aug 11, 1928 (85 y.o. M) Treating RN: Army Melia Primary Care Provider: Lavone Orn Other Clinician: Referring Provider: Referral, Self Treating Provider/Extender: Melburn Hake, HOYT Weeks in Treatment: 0 Active Problems ICD-10 Evaluated Encounter Code Description Active Date Today Diagnosis S51.802A Unspecified open wound of left forearm, initial encounter 02/21/2019 No Yes I48.0 Paroxysmal atrial fibrillation 02/21/2019 No Yes Inactive Problems Resolved Problems Electronic Signature(s) Signed: 02/21/2019 10:25:29 AM By: Worthy Keeler PA-C Entered By: Worthy Keeler on 02/21/2019 10:25:29 [name] ([id number]) -------------------------------------------------------------------------------- Progress Note Details Patient Name: [name] Date of Service: 02/21/2019 9:45 AM Medical Record Number: [id number] Patient Account Number: [id number] Date of Birth/Sex: [birth year] (85 y.o. M) Treating RN: Army Melia Primary Care Provider: Lavone Orn Other Clinician: Referring Provider: Referral, Self Treating Provider/Extender: Melburn Hake, HOYT Weeks in Treatment: 0 Subjective Chief Complaint Information obtained from Patient Left forearm skin tear History of Present Illness (HPI) 10/26/17-He is here for an initial evaluation for left lower extremity traumatic injury. He states that on approximate 3 weeks ago he injured his leg with a step stool while cleaning his car. He saw Dr. Deforest Hoyles at [facility] on 5/15 who prescribed doxycycline; he had a follow-up on 5/20 when he was referred to the wound clinic and instructed to continue doxycycline and Neosporin. He denies pain, he denies edema. He is nondiabetic and a former smoker, quit in 1968. He does wear compression stockings/wraps. 11/02/17-He is here for follow-up evaluation of the left lower  extremity ulcer. There is significant improvement in both measurements and appearance. He completed doxycycline on Saturday. We will change to [facility] and he will follow-up next week 11/09/17- He is here for follow up evaluation of the left lower extremity ulcer. He continues to progress, we will continue with same treatment plan and he will follow up next week 11/16/17-He is here for follow-up evaluation for left lower extremity ulcer. He continues to make progress with improvement and measurement; continues to have red granulation tissue throughout. We will continue with same treatment plan he will follow-up next week 11/23/17-He is here in follow up evaluation for left lower extremity ulcer. He continues to make progress, there is hyper granulation present in the wound today we will switch to [facility]. He'll follow-up next week, anticipate he will be healed at that time 11/30/17-He is here for evaluation for  left lower extremity wound. This is essentially healed with small area of partial-thickness opening. We'll be closed by next Thursday; I anticipate him to be healed before then. He was asked to call the office on Monday July 8, to inform of if he is healed or needs a follow up on the 11th. Readmission: 02/21/2019 on evaluation today patient actually appears to be doing quite well with regard to his skin tear on the left forearm. He tells me this actually occurred about a week ago when he was in his garage and had his arm over an aluminum chair. Subsequently he had a fly landed on his leg and he went to squat it moving quickly and caused a skin tear on his forearm. Since that time he has been tolerating the dressing changes without complication he did use some Hydrofera Blue but unfortunately this seemed to get somewhat stuck. He did trim off a portion of the skin as well that was thin and rolling under. That seems to have helped with the healing as well and there is a lot of new  epithelization which is great news. Overall I am rather pleased with the way things appear currently. No fevers, chills, nausea, vomiting, or diarrhea. The patient is on Eliquis and tells me he did stop that on Monday in preparation for me potentially performing some type of debridement here in the office today. Patient History Information obtained from Patient. Allergies Septra, Avelox (Severity: Moderate, Reaction: rash face in 2007) Family History DEMARQUISE, WEATHERLEY ([id number]) Heart Disease - Father,Mother, Hypertension - Mother,Father, No family history of Cancer, Hereditary Spherocytosis, Kidney Disease, Lung Disease, Seizures, Stroke, Thyroid Problems, Tuberculosis. Social History Former smoker - quit 50 years ago, Alcohol Use - Never, Drug Use - No History, Caffeine Use - Daily. Medical History Eyes Denies history of Cataracts, Glaucoma, Optic Neuritis Ear/Nose/Mouth/Throat Denies history of Chronic sinus problems/congestion, Middle ear problems Hematologic/Lymphatic Denies history of Anemia, Hemophilia, Human Immunodeficiency Virus, Lymphedema, Sickle Cell Disease Respiratory Denies history of Aspiration, Asthma, Chronic Obstructive Pulmonary Disease (COPD), Pneumothorax, Sleep Apnea, Tuberculosis Cardiovascular Patient has history of Arrhythmia - a fib, Hypertension Denies history of Angina, Congestive Heart Failure, Coronary Artery Disease, Deep Vein Thrombosis, Hypotension, Myocardial Infarction, Peripheral Arterial Disease, Peripheral Venous Disease, Phlebitis, Vasculitis Gastrointestinal Denies history of Cirrhosis , Colitis, Crohn s, Hepatitis A, Hepatitis B, Hepatitis C Endocrine Denies history of Type I Diabetes, Type II Diabetes Genitourinary Denies history of End Stage Renal Disease Immunological Denies history of Lupus Erythematosus, Raynaud s, Scleroderma Integumentary (Skin) Denies history of History of Burn, History of pressure wounds Musculoskeletal Denies  history of Gout, Rheumatoid Arthritis, Osteoarthritis, Osteomyelitis Neurologic Denies history of Dementia, Neuropathy, Quadriplegia, Paraplegia, Seizure Disorder Oncologic Denies history of Received Chemotherapy, Received Radiation Psychiatric Denies history of Anorexia/bulimia, Confinement Anxiety Medical And Surgical History Notes Oncologic multiple melanomas Review of Systems (ROS) Eyes Denies complaints or symptoms of Dry Eyes, Vision Changes, Glasses / Contacts. Ear/Nose/Mouth/Throat Complains or has symptoms of Sinusitis. Denies complaints or symptoms of Difficult clearing ears. Hematologic/Lymphatic Denies complaints or symptoms of Bleeding / Clotting Disorders, Human Immunodeficiency Virus. Respiratory Denies complaints or symptoms of Chronic or frequent coughs, Shortness of Breath. Gastrointestinal Denies complaints or symptoms of Frequent diarrhea, Nausea, Vomiting. Endocrine Denies complaints or symptoms of Hepatitis, Thyroid disease, Polydypsia (Excessive Thirst). Genitourinary Denies complaints or symptoms of Kidney failure/ Dialysis, Incontinence/dribbling. OSAGIE, DOSKOCIL ([id number]) Immunological Denies complaints or symptoms of Hives, Itching. Integumentary (Skin) Denies complaints or symptoms of Wounds, Bleeding or bruising tendency, Breakdown, Swelling. Musculoskeletal  Denies complaints or symptoms of Muscle Pain, Muscle Weakness. Neurologic Denies complaints or symptoms of Numbness/parasthesias, Focal/Weakness. Psychiatric Denies complaints or symptoms of Anxiety, Claustrophobia. General Notes: Pacemaker 2016 Objective Constitutional patient is hypertensive.. pulse regular and within target range for patient.Marland Kitchen respirations regular, non-labored and within target range for patient.Marland Kitchen temperature within target range for patient.. Well-nourished and well-hydrated in no acute distress. Vitals Time Taken: 10:05 AM, Height: 70 in, Source: Stated, Weight: 155 lbs,  Source: Stated, BMI: 22.2, Temperature: 98.3  F, Pulse: 70 bpm, Respiratory Rate: 016 breaths/min, Blood Pressure: 150/77 mmHg. Eyes conjunctiva clear no eyelid edema noted. pupils equal round and reactive to light and accommodation. Ears, Nose, Mouth, and Throat no gross abnormality of ear auricles or external auditory canals. normal hearing noted during conversation. mucus membranes moist. Respiratory normal breathing without difficulty. clear to auscultation bilaterally. Cardiovascular regular rate and rhythm with normal S1, S2. no clubbing, cyanosis, significant edema, Gastrointestinal (GI) soft, non-tender, non-distended, +BS. no ventral hernia noted. Musculoskeletal normal gait and posture. no significant deformity or arthritic changes, no loss or range of motion, no clubbing. Psychiatric this patient is able to make decisions and demonstrates good insight into disease process. Alert and Oriented x 3. pleasant and cooperative. General Notes: Patient's wound bed currently showed signs of one very small area of epiboly which subsequently did need to be trimmed away this is where the tissue rolled over on itself and is not healing well at the base. I believe if we trim this off carefully that will likely allow the area to heal more effectively without any complications. He was in agreement with this. Therefore I did go ahead and using scissors and forceps to trim away the very small portion of skin that was rolled under causing this area not to re-adhere appropriately. He tolerated that he tells me with barely any discomfort and post debridement wound bed appears to be doing excellent stasis was achieved with pressure. JEMEL, HARIS ([id number]) Integumentary (Hair, Skin) Wound #2 status is Open. Original cause of wound was Trauma. The wound is located on the Left,Dorsal Forearm. The wound measures 2.3cm length x 2cm width x 0.1cm depth; 3.613cm^2 area and 0.361cm^3 volume. There is  Fat Layer (Subcutaneous Tissue) Exposed exposed. There is no tunneling or undermining noted. There is a small amount of serous drainage noted. The wound margin is flat and intact. There is medium (34-66%) pink granulation within the wound bed. There is a small (1-33%) amount of necrotic tissue within the wound bed including Adherent Slough. Assessment Active Problems ICD-10 Unspecified open wound of left forearm, initial encounter Paroxysmal atrial fibrillation Procedures Wound #2 Pre-procedure diagnosis of Wound #2 is a Trauma, Other located on the Left,Dorsal Forearm . There was a Excisional Skin/Subcutaneous Tissue Debridement with a total area of 4.6 sq cm performed by STONE III, HOYT E., PA-C. With the following instrument(s): Scissors Material removed includes Subcutaneous Tissue and Skin: Dermis and after achieving pain control using Lidocaine. A time out was conducted at 10:29, prior to the start of the procedure. A Minimum amount of bleeding was controlled with Pressure. The procedure was tolerated well. Post Debridement Measurements: 2.3cm length x 2cm width x 0.1cm depth; 0.361cm^3 volume. Character of Wound/Ulcer Post Debridement is stable. Post procedure Diagnosis Wound #2: Same as Pre-Procedure Plan Wound Cleansing: Wound #2 Left,Dorsal Forearm: Clean wound with Normal Saline. Primary Wound Dressing: Wound #2 Left,Dorsal Forearm: Xeroform Secondary Dressing: Wound #2 Left,Dorsal Forearm: Other - telfa non-adherent pad, secure with stretch net Dressing Change  Frequency: Wound #2 Left,Dorsal Forearm: Change dressing every day. Follow-up Appointments: Wound #2 Left,Dorsal Forearm: Return Appointment in 1 week. BEMISS, [place] ([id number]) 1. I would recommend that we go ahead and initiate treatment with a Xeroform gauze dressing I think that would be a good option for the patient today. 2. I am going to suggest as well that we go ahead with covering this with a Telfa  island just to prevent it from drying out and we will secure this with stretch net. 3. I am also can recommend that the patient double check with his primary care provider to ensure that he has indeed had an updated tetanus shot within the past 5 years. He is getting give them a call today. We will see patient back for reevaluation in 1 week here in the clinic. If anything worsens or changes patient will contact our office for additional recommendations. Electronic Signature(s) Signed: 02/21/2019 11:59:31 AM By: Worthy Keeler PA-C Previous Signature: 02/21/2019 11:59:00 AM Version By: Worthy Keeler PA-C Entered By: Worthy Keeler on 02/21/2019 11:59:31 [name] ([id number]) -------------------------------------------------------------------------------- ROS/PFSH Details Patient Name: [name] Date of Service: 02/21/2019 9:45 AM Medical Record Number: [id number] Patient Account Number: [id number] Date of Birth/Sex: 1928-09-08 (85 y.o. M) Treating RN: Harold Barban Primary Care Provider: Lavone Orn Other Clinician: Referring Provider: Referral, Self Treating Provider/Extender: Melburn Hake, HOYT Weeks in Treatment: 0 Information Obtained From Patient Eyes Complaints and Symptoms: Negative for: Dry Eyes; Vision Changes; Glasses / Contacts Medical History: Negative for: Cataracts; Glaucoma; Optic Neuritis Ear/Nose/Mouth/Throat Complaints and Symptoms: Positive for: Sinusitis Negative for: Difficult clearing ears Medical History: Negative for: Chronic sinus problems/congestion; Middle ear problems Hematologic/Lymphatic Complaints and Symptoms: Negative for: Bleeding / Clotting Disorders; Human Immunodeficiency Virus Medical History: Negative for: Anemia; Hemophilia; Human Immunodeficiency Virus; Lymphedema; Sickle Cell Disease Respiratory Complaints and Symptoms: Negative for: Chronic or frequent coughs; Shortness of Breath Medical History: Negative for:  Aspiration; Asthma; Chronic Obstructive Pulmonary Disease (COPD); Pneumothorax; Sleep Apnea; Tuberculosis Gastrointestinal Complaints and Symptoms: Negative for: Frequent diarrhea; Nausea; Vomiting Medical History: Negative for: Cirrhosis ; Colitis; Crohnos; Hepatitis A; Hepatitis B; Hepatitis C Endocrine Complaints and Symptoms: Negative for: Hepatitis; Thyroid disease; Polydypsia (Excessive Thirst) Medical HistoryJOSIAHA, PICKMAN ([id number]) Negative for: Type I Diabetes; Type II Diabetes Genitourinary Complaints and Symptoms: Negative for: Kidney failure/ Dialysis; Incontinence/dribbling Medical History: Negative for: End Stage Renal Disease Immunological Complaints and Symptoms: Negative for: Hives; Itching Medical History: Negative for: Lupus Erythematosus; Raynaudos; Scleroderma Integumentary (Skin) Complaints and Symptoms: Negative for: Wounds; Bleeding or bruising tendency; Breakdown; Swelling Medical History: Negative for: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

## 2019-02-22 ENCOUNTER — Other Ambulatory Visit: Payer: Self-pay

## 2019-02-22 DIAGNOSIS — S51802A Unspecified open wound of left forearm, initial encounter: Secondary | ICD-10-CM | POA: Diagnosis not present

## 2019-02-22 DIAGNOSIS — Z20822 Contact with and (suspected) exposure to covid-19: Secondary | ICD-10-CM

## 2019-02-22 DIAGNOSIS — R6889 Other general symptoms and signs: Secondary | ICD-10-CM | POA: Diagnosis not present

## 2019-02-23 LAB — NOVEL CORONAVIRUS, NAA: SARS-CoV-2, NAA: DETECTED — AB

## 2019-02-25 DIAGNOSIS — S51802A Unspecified open wound of left forearm, initial encounter: Secondary | ICD-10-CM | POA: Diagnosis not present

## 2019-02-28 ENCOUNTER — Ambulatory Visit: Payer: Medicare Other | Admitting: Physician Assistant

## 2019-03-07 ENCOUNTER — Ambulatory Visit: Payer: Medicare Other | Admitting: Physician Assistant

## 2019-03-14 ENCOUNTER — Ambulatory Visit: Payer: Medicare Other | Admitting: Physician Assistant

## 2019-03-20 ENCOUNTER — Other Ambulatory Visit: Payer: Self-pay

## 2019-03-20 DIAGNOSIS — Z20828 Contact with and (suspected) exposure to other viral communicable diseases: Secondary | ICD-10-CM | POA: Diagnosis not present

## 2019-03-20 DIAGNOSIS — Z20822 Contact with and (suspected) exposure to covid-19: Secondary | ICD-10-CM

## 2019-03-21 LAB — NOVEL CORONAVIRUS, NAA: SARS-CoV-2, NAA: NOT DETECTED

## 2019-03-22 ENCOUNTER — Telehealth: Payer: Self-pay | Admitting: Internal Medicine

## 2019-03-22 NOTE — Telephone Encounter (Signed)
Pt aware covid lab test negative, not detected °

## 2019-04-22 ENCOUNTER — Ambulatory Visit (INDEPENDENT_AMBULATORY_CARE_PROVIDER_SITE_OTHER): Payer: Medicare Other | Admitting: *Deleted

## 2019-04-22 DIAGNOSIS — I4430 Unspecified atrioventricular block: Secondary | ICD-10-CM

## 2019-04-22 DIAGNOSIS — I480 Paroxysmal atrial fibrillation: Secondary | ICD-10-CM

## 2019-04-23 LAB — CUP PACEART REMOTE DEVICE CHECK
Battery Remaining Longevity: 121 mo
Battery Remaining Percentage: 95.5 %
Battery Voltage: 2.98 V
Brady Statistic AP VP Percent: 70 %
Brady Statistic AP VS Percent: 1 %
Brady Statistic AS VP Percent: 27 %
Brady Statistic AS VS Percent: 1 %
Brady Statistic RA Percent Paced: 70 %
Brady Statistic RV Percent Paced: 97 %
Date Time Interrogation Session: 20201116075634
Implantable Lead Implant Date: 20161003
Implantable Lead Implant Date: 20161003
Implantable Lead Location: 753859
Implantable Lead Location: 753860
Implantable Pulse Generator Implant Date: 20161003
Lead Channel Impedance Value: 360 Ohm
Lead Channel Impedance Value: 390 Ohm
Lead Channel Pacing Threshold Amplitude: 0.5 V
Lead Channel Pacing Threshold Amplitude: 0.625 V
Lead Channel Pacing Threshold Pulse Width: 0.4 ms
Lead Channel Pacing Threshold Pulse Width: 0.4 ms
Lead Channel Sensing Intrinsic Amplitude: 5 mV
Lead Channel Sensing Intrinsic Amplitude: 6.8 mV
Lead Channel Setting Pacing Amplitude: 0.875
Lead Channel Setting Pacing Amplitude: 1.5 V
Lead Channel Setting Pacing Pulse Width: 0.4 ms
Lead Channel Setting Sensing Sensitivity: 2 mV
Pulse Gen Model: 2240
Pulse Gen Serial Number: 7818292

## 2019-05-07 ENCOUNTER — Ambulatory Visit (INDEPENDENT_AMBULATORY_CARE_PROVIDER_SITE_OTHER): Payer: Medicare Other | Admitting: Cardiology

## 2019-05-07 ENCOUNTER — Other Ambulatory Visit: Payer: Self-pay

## 2019-05-07 ENCOUNTER — Encounter: Payer: Self-pay | Admitting: Cardiology

## 2019-05-07 VITALS — BP 122/68 | HR 67 | Ht 70.0 in | Wt 153.4 lb

## 2019-05-07 DIAGNOSIS — I441 Atrioventricular block, second degree: Secondary | ICD-10-CM | POA: Diagnosis not present

## 2019-05-07 NOTE — Progress Notes (Signed)
Electrophysiology Office Note   Date:  05/07/2019   ID:  [name], DOB 1929/03/10, MRN [id number]  PCP:  Lavone Orn, MD  Primary Electrophysiologist:  Rut Betterton Meredith Leeds, MD    No chief complaint on file.    History of Present Illness: [name] is a 85 y.o. male who presents today for electrophysiology evaluation.   He initially presented in September with fatugue and was found to be in 2:1 heart block.  He had a pacemaker placed in October 2016.  He has been doing well without major issues. Is tolerating his medications without problems.   Today, denies symptoms of palpitations, chest pain, shortness of breath, orthopnea, PND, lower extremity edema, claudication, dizziness, presyncope, syncope, bleeding, or neurologic sequela. The patient is tolerating medications without difficulties.  Is overall feeling well.   Past Medical History:  Diagnosis Date  . ED (erectile dysfunction)   . Hypertension   . Melanoma ([place])    left abdomen   Past Surgical History:  Procedure Laterality Date  . EP IMPLANTABLE DEVICE N/A 03/09/2015   Procedure: Pacemaker Implant;  Surgeon: Rosaelena Kemnitz Meredith Leeds, MD;  Location: [facility];  Service: Cardiovascular;  Laterality: N/A;  . NASAL SINUS SURGERY       Current Outpatient Medications  Medication Sig Dispense Refill  . amLODipine (NORVASC) 10 MG tablet Take 10 mg by mouth daily.  3  . cholecalciferol (VITAMIN D) 1000 UNITS tablet Take 1,000 Units by mouth daily.    Marland Kitchen ELIQUIS 5 MG TABS tablet Take 1 tablet by mouth twice daily 60 tablet 5  . lisinopril (PRINIVIL,ZESTRIL) 20 MG tablet Take 20 mg by mouth daily.  3  . triamcinolone cream (KENALOG) 0.1 % APPLY TO AFFECTED AREAS UP TO TWO TIMES DAILY AS NEEDED. AVOID FACE AND UNDERARMS.  1  . donepezil (ARICEPT) 5 MG tablet Take 1 tablet by mouth daily.     No current facility-administered medications for this visit.     Allergies:   Avelox [moxifloxacin hcl in nacl],  Moxifloxacin, and Sulfa antibiotics   Social History:  The patient  reports that he has quit smoking. He has never used smokeless tobacco. He reports current alcohol use. He reports that he does not use drugs.   Family History:  The patient's family history includes Heart failure in his mother; Hypertension in his father and mother.   ROS:  Please see the history of present illness.   Otherwise, review of systems is positive for none.   All other systems are reviewed and negative.   PHYSICAL EXAM: VS:  BP 122/68   Pulse 67   Ht 5\' 10"  (1.778 m)   Wt 153 lb 6.4 oz (69.6 kg)   SpO2 98%   BMI 22.01 kg/m  , BMI Body mass index is 22.01 kg/m. GEN: Well nourished, well developed, in no acute distress  HEENT: normal  Neck: no JVD, carotid bruits, or masses Cardiac: RRR; no murmurs, rubs, or gallops,no edema  Respiratory:  clear to auscultation bilaterally, normal work of breathing GI: soft, nontender, nondistended, + BS MS: no deformity or atrophy  Skin: warm and dry, device site well healed Neuro:  Strength and sensation are intact Psych: euthymic mood, full affect  EKG:  EKG is ordered today. Personal review of the ekg ordered shows atrial sensed, ventricular paced, rate 67  Personal review of the device interrogation today. Results in [place]: No results found for requested labs within last 8760 hours.  Lipid Panel  No results found for: CHOL, TRIG, HDL, CHOLHDL, VLDL, LDLCALC, LDLDIRECT   Wt Readings from Last 3 Encounters:  05/07/19 153 lb 6.4 oz (69.6 kg)  04/12/18 168 lb (76.2 kg)  04/25/17 169 lb (76.7 kg)   ASSESSMENT AND PLAN:  1.  2:1 AV block: Status post Saint Jude dual-chamber pacemaker implanted 2016.  Device functioning appropriately.  He is having some atrial conduction that is throwing off his timing.  We Anniebelle Devore change his AV delay to 150.  2. Hypertension: well controlled  3. Paroxysmal atrial fibrillation: Currently on Eliquis without  bleeding issues.  No changes.  This patients CHA2DS2-VASc Score and unadjusted Ischemic Stroke Rate (% per year) is equal to 3.2 % stroke rate/year from a score of 3  Above score calculated as 1 point each if present [CHF, HTN, DM, Vascular=MI/PAD/Aortic Plaque, Age if 65-74, or Male] Above score calculated as 2 points each if present [Age > 75, or Stroke/TIA/TE]   Current medicines are reviewed at length with the patient today.   The patient does not have concerns regarding his medicines.  The following changes were made today: none  Labs/ tests ordered today include:  Orders Placed This Encounter  Procedures  . EKG 12-Lead     Disposition:   FU with Haliegh Khurana 12 months  Signed, Maxximus Gotay Meredith Leeds, MD  05/07/2019 2:43 PM     [address] [place] [address] [phone] (office) [phone] (fax)

## 2019-05-09 DIAGNOSIS — H40153 Residual stage of open-angle glaucoma, bilateral: Secondary | ICD-10-CM | POA: Diagnosis not present

## 2019-05-18 NOTE — Progress Notes (Signed)
Remote pacemaker transmission.   

## 2019-06-10 DIAGNOSIS — H938X2 Other specified disorders of left ear: Secondary | ICD-10-CM | POA: Diagnosis not present

## 2019-06-10 DIAGNOSIS — H6982 Other specified disorders of Eustachian tube, left ear: Secondary | ICD-10-CM | POA: Diagnosis not present

## 2019-07-10 DIAGNOSIS — H524 Presbyopia: Secondary | ICD-10-CM | POA: Diagnosis not present

## 2019-07-10 DIAGNOSIS — H40153 Residual stage of open-angle glaucoma, bilateral: Secondary | ICD-10-CM | POA: Diagnosis not present

## 2019-07-11 ENCOUNTER — Encounter: Payer: Self-pay | Admitting: Family Medicine

## 2019-07-11 ENCOUNTER — Other Ambulatory Visit: Payer: Self-pay

## 2019-07-11 ENCOUNTER — Ambulatory Visit: Payer: Medicare Other | Admitting: Family Medicine

## 2019-07-11 VITALS — BP 116/56 | HR 64 | Temp 97.8°F | Ht 68.0 in | Wt 156.4 lb

## 2019-07-11 DIAGNOSIS — I10 Essential (primary) hypertension: Secondary | ICD-10-CM

## 2019-07-11 DIAGNOSIS — M479 Spondylosis, unspecified: Secondary | ICD-10-CM | POA: Insufficient documentation

## 2019-07-11 DIAGNOSIS — F0390 Unspecified dementia without behavioral disturbance: Secondary | ICD-10-CM

## 2019-07-11 DIAGNOSIS — C439 Malignant melanoma of skin, unspecified: Secondary | ICD-10-CM | POA: Diagnosis not present

## 2019-07-11 DIAGNOSIS — I4430 Unspecified atrioventricular block: Secondary | ICD-10-CM | POA: Diagnosis not present

## 2019-07-11 DIAGNOSIS — F03A0 Unspecified dementia, mild, without behavioral disturbance, psychotic disturbance, mood disturbance, and anxiety: Secondary | ICD-10-CM

## 2019-07-11 DIAGNOSIS — H9190 Unspecified hearing loss, unspecified ear: Secondary | ICD-10-CM | POA: Insufficient documentation

## 2019-07-11 DIAGNOSIS — I480 Paroxysmal atrial fibrillation: Secondary | ICD-10-CM | POA: Diagnosis not present

## 2019-07-11 DIAGNOSIS — H9193 Unspecified hearing loss, bilateral: Secondary | ICD-10-CM

## 2019-07-11 NOTE — Assessment & Plan Note (Signed)
Has pacemaker in place

## 2019-07-11 NOTE — Progress Notes (Signed)
Chief Complaint  Patient presents with  . Establish Care    no concerns but would like ppw for handicap placard    History of Present Illness: HPI  86 year old male presents to establish care.  previous MD Dr. Laurann Montana.  Last  OV 1 year ago,  For physical  Followed by Dr. Curt Bears, cardiology for Second degree AV block and paroxsysmal Afib.  On ELiquis, on ACEI and , amlodipine,. Rate controlled.  S/P pacemaker  2016.  HX of melanoma 1972 No issues since.  Hypertension:    Good control. BP Readings from Last 3 Encounters:  07/11/19 (!) 116/56  05/07/19 122/68  04/12/18 130/64   Using medication without problems or lightheadedness:none  Chest pain with exertion:none Edema:none Short of breath:none Average home BPs: Other issues:   Less energy overall. Minimal walking.  Mild memory loss: Start on Aricept  He says minimal issues, but reports wife notes. iWfe passed from [place] 06/14/2019  Had been married 31 years.  Living in house by self, son living with him in last few weeks.   This visit occurred during the SARS-CoV-2 public health emergency.  Safety protocols were in place, including screening questions prior to the visit, additional usage of staff PPE, and extensive cleaning of exam room while observing appropriate contact time as indicated for disinfecting solutions.   COVID 19 screen:  No recent travel or known exposure to COVID19 The patient denies respiratory symptoms of COVID 19 at this time. The importance of social distancing was discussed today.     Review of Systems  Constitutional: Negative for chills and fever.  HENT: Negative for congestion and ear pain.   Eyes: Negative for pain and redness.  Respiratory: Negative for cough and shortness of breath.   Cardiovascular: Negative for chest pain, palpitations and leg swelling.  Gastrointestinal: Positive for constipation. Negative for abdominal pain, blood in stool, diarrhea, nausea and vomiting.   Uses milk on Mg off and on  Genitourinary: Negative for dysuria.  Musculoskeletal: Negative for falls and myalgias.  Skin: Negative for rash.  Neurological: Negative for dizziness.  Psychiatric/Behavioral: Negative for depression. The patient is not nervous/anxious.       Past Medical History:  Diagnosis Date  . Arrhythmia   . Arthritis   . Atrial fibrillation ([place])   . Cancer ([place]) 1972  . ED (erectile dysfunction)   . Glaucoma   . History of chicken pox   . Hypertension   . Melanoma ([place])    left abdomen    reports that he has quit smoking. He has never used smokeless tobacco. He reports previous alcohol use. He reports that he does not use drugs.   Current Outpatient Medications:  .  amLODipine (NORVASC) 10 MG tablet, Take 10 mg by mouth daily., Disp: , Rfl: 3 .  cholecalciferol (VITAMIN D) 1000 UNITS tablet, Take 1,000 Units by mouth daily., Disp: , Rfl:  .  donepezil (ARICEPT) 5 MG tablet, Take 1 tablet by mouth daily., Disp: , Rfl:  .  ELIQUIS 5 MG TABS tablet, Take 1 tablet by mouth twice daily, Disp: 60 tablet, Rfl: 5 .  lisinopril (PRINIVIL,ZESTRIL) 20 MG tablet, Take 20 mg by mouth daily., Disp: , Rfl: 3 .  Multiple Vitamins-Minerals (ZINC PO), Take by mouth., Disp: , Rfl:  .  triamcinolone cream (KENALOG) 0.1 %, APPLY TO AFFECTED AREAS UP TO TWO TIMES DAILY AS NEEDED. AVOID FACE AND UNDERARMS., Disp: , Rfl: 1   Observations/Objective: Blood pressure (!) 116/56, pulse 64,  temperature 97.8 F (36.6 C), height 5\' 8"  (1.727 m), weight 156 lb 6.4 oz (70.9 kg), SpO2 98 %.  Physical Exam Constitutional:      Appearance: He is well-developed.     Comments: elderly male in NAD  HENT:     Head: Normocephalic.     Right Ear: Hearing normal.     Left Ear: Hearing normal.     Nose: Nose normal.  Neck:     Thyroid: No thyroid mass or thyromegaly.     Vascular: No carotid bruit.     Trachea: Trachea normal.  Cardiovascular:     Rate and Rhythm: Normal rate and regular  rhythm.     Pulses: Normal pulses.     Heart sounds: Heart sounds not distant. No murmur. No friction rub. No gallop.      Comments: No peripheral edema Pulmonary:     Effort: Pulmonary effort is normal. No respiratory distress.     Breath sounds: Normal breath sounds.  Skin:    General: Skin is warm and dry.     Findings: No rash.  Psychiatric:        Speech: Speech normal.        Behavior: Behavior normal.        Thought Content: Thought content normal.      Assessment and Plan   Heart block, AV  Has pacemaker in place.  Essential hypertension  Well controlled on lisinopril, amlodipine.  Osteoarthritis of lower back   Using tylenol for pain.   Hearing loss  Hearing both ears  Mild dementia (HCC)  Stable on aricept.  Paroxysmal atrial fibrillation ([place])  Followed by cardiology Dr. Lennie Odor... on Eliquis anticoagulation, AECEI and amlodipine.. rate controlled.  Melanoma ([place])  Followed by Derm.    Eliezer Lofts, MD

## 2019-07-11 NOTE — Patient Instructions (Addendum)
Consider  COVID19 vaccine.  It was good meeting you today!

## 2019-07-11 NOTE — Assessment & Plan Note (Signed)
Stable on aricept. 

## 2019-07-11 NOTE — Assessment & Plan Note (Signed)
Using tylenol for pain.

## 2019-07-11 NOTE — Assessment & Plan Note (Signed)
Hearing both ears

## 2019-07-11 NOTE — Assessment & Plan Note (Signed)
Well controlled on lisinopril, amlodipine.

## 2019-07-22 ENCOUNTER — Ambulatory Visit (INDEPENDENT_AMBULATORY_CARE_PROVIDER_SITE_OTHER): Payer: Medicare Other | Admitting: *Deleted

## 2019-07-22 DIAGNOSIS — I480 Paroxysmal atrial fibrillation: Secondary | ICD-10-CM

## 2019-07-22 LAB — CUP PACEART REMOTE DEVICE CHECK
Battery Remaining Longevity: 124 mo
Battery Remaining Percentage: 95.5 %
Battery Voltage: 2.98 V
Brady Statistic AP VP Percent: 55 %
Brady Statistic AP VS Percent: 1 %
Brady Statistic AS VP Percent: 40 %
Brady Statistic AS VS Percent: 1 %
Brady Statistic RA Percent Paced: 52 %
Brady Statistic RV Percent Paced: 96 %
Date Time Interrogation Session: 20210215020029
Implantable Lead Implant Date: 20161003
Implantable Lead Implant Date: 20161003
Implantable Lead Location: 753859
Implantable Lead Location: 753860
Implantable Pulse Generator Implant Date: 20161003
Lead Channel Impedance Value: 390 Ohm
Lead Channel Impedance Value: 410 Ohm
Lead Channel Pacing Threshold Amplitude: 0.5 V
Lead Channel Pacing Threshold Amplitude: 0.75 V
Lead Channel Pacing Threshold Pulse Width: 0.4 ms
Lead Channel Pacing Threshold Pulse Width: 0.4 ms
Lead Channel Sensing Intrinsic Amplitude: 5 mV
Lead Channel Sensing Intrinsic Amplitude: 8.3 mV
Lead Channel Setting Pacing Amplitude: 1 V
Lead Channel Setting Pacing Amplitude: 1.5 V
Lead Channel Setting Pacing Pulse Width: 0.4 ms
Lead Channel Setting Sensing Sensitivity: 2 mV
Pulse Gen Model: 2240
Pulse Gen Serial Number: 7818292

## 2019-07-23 NOTE — Progress Notes (Signed)
PPM Remote  

## 2019-08-12 NOTE — Assessment & Plan Note (Signed)
Followed by cardiology Dr. Lennie Odor... on Eliquis anticoagulation, AECEI and amlodipine.. rate controlled.

## 2019-08-12 NOTE — Assessment & Plan Note (Signed)
Followed by Derm

## 2019-08-27 ENCOUNTER — Telehealth: Payer: Self-pay | Admitting: Family Medicine

## 2019-08-27 MED ORDER — DONEPEZIL HCL 5 MG PO TABS: 5.0000 mg | ORAL_TABLET | Freq: Every day | ORAL | 3 refills | Status: DC

## 2019-08-27 NOTE — Telephone Encounter (Signed)
Patient came in requesting refill for Donepezil HCL 5 MG.

## 2019-08-27 NOTE — Telephone Encounter (Signed)
Refill sent as requested. 

## 2019-09-03 DIAGNOSIS — R252 Cramp and spasm: Secondary | ICD-10-CM | POA: Diagnosis not present

## 2019-09-03 DIAGNOSIS — Z85828 Personal history of other malignant neoplasm of skin: Secondary | ICD-10-CM | POA: Diagnosis not present

## 2019-09-04 ENCOUNTER — Other Ambulatory Visit: Payer: Self-pay | Admitting: Cardiology

## 2019-09-04 DIAGNOSIS — I441 Atrioventricular block, second degree: Secondary | ICD-10-CM

## 2019-09-04 NOTE — Telephone Encounter (Signed)
Age 86, weight 71kg, SCr 1.08 on 07/13/18 - needs updated BMET. Pt has labs scheduled at PCP on 4/5 but cannot see what labs have been ordered. I have sent a message PCP Dr Diona Browner to see if they can check BMET so we can ensure pt continues on correct dose of Eliquis and avoid having duplicate labs drawn at our office.

## 2019-09-05 ENCOUNTER — Telehealth: Payer: Self-pay | Admitting: Family Medicine

## 2019-09-05 ENCOUNTER — Other Ambulatory Visit: Payer: Self-pay | Admitting: Otolaryngology

## 2019-09-05 DIAGNOSIS — R252 Cramp and spasm: Secondary | ICD-10-CM

## 2019-09-05 DIAGNOSIS — I10 Essential (primary) hypertension: Secondary | ICD-10-CM

## 2019-09-05 DIAGNOSIS — Z85828 Personal history of other malignant neoplasm of skin: Secondary | ICD-10-CM

## 2019-09-05 NOTE — Telephone Encounter (Signed)
-----   Message from Ellamae Sia sent at 08/27/2019  3:47 PM EDT ----- Regarding: Lab orders for Monday, 4.5.21  AWV lab orders, please.

## 2019-09-06 ENCOUNTER — Other Ambulatory Visit: Payer: Self-pay

## 2019-09-06 DIAGNOSIS — I441 Atrioventricular block, second degree: Secondary | ICD-10-CM

## 2019-09-06 NOTE — Telephone Encounter (Signed)
Patient needs updated labs. He is getting labs done at PCP office on Monday. I called patient to make sure he had enough until Monday or Tuesday but did not answer- left message. This is duplicate request

## 2019-09-09 ENCOUNTER — Other Ambulatory Visit: Payer: Self-pay

## 2019-09-09 ENCOUNTER — Ambulatory Visit: Payer: Medicare Other

## 2019-09-09 ENCOUNTER — Other Ambulatory Visit (INDEPENDENT_AMBULATORY_CARE_PROVIDER_SITE_OTHER): Payer: Medicare Other

## 2019-09-09 DIAGNOSIS — I10 Essential (primary) hypertension: Secondary | ICD-10-CM | POA: Diagnosis not present

## 2019-09-09 LAB — CBC WITH DIFFERENTIAL/PLATELET
Basophils Absolute: 0.1 10*3/uL (ref 0.0–0.1)
Basophils Relative: 1.1 % (ref 0.0–3.0)
Eosinophils Absolute: 0.3 10*3/uL (ref 0.0–0.7)
Eosinophils Relative: 4.4 % (ref 0.0–5.0)
HCT: 44.2 % (ref 39.0–52.0)
Hemoglobin: 14.6 g/dL (ref 13.0–17.0)
Lymphocytes Relative: 33.7 % (ref 12.0–46.0)
Lymphs Abs: 1.9 10*3/uL (ref 0.7–4.0)
MCHC: 33 g/dL (ref 30.0–36.0)
MCV: 96.8 fl (ref 78.0–100.0)
Monocytes Absolute: 0.6 10*3/uL (ref 0.1–1.0)
Monocytes Relative: 10.7 % (ref 3.0–12.0)
Neutro Abs: 2.9 10*3/uL (ref 1.4–7.7)
Neutrophils Relative %: 50.1 % (ref 43.0–77.0)
Platelets: 187 10*3/uL (ref 150.0–400.0)
RBC: 4.57 Mil/uL (ref 4.22–5.81)
RDW: 13.9 % (ref 11.5–15.5)
WBC: 5.7 10*3/uL (ref 4.0–10.5)

## 2019-09-09 LAB — COMPREHENSIVE METABOLIC PANEL
ALT: 12 U/L (ref 0–53)
AST: 16 U/L (ref 0–37)
Albumin: 4.2 g/dL (ref 3.5–5.2)
Alkaline Phosphatase: 141 U/L — ABNORMAL HIGH (ref 39–117)
BUN: 20 mg/dL (ref 6–23)
CO2: 30 mEq/L (ref 19–32)
Calcium: 9.2 mg/dL (ref 8.4–10.5)
Chloride: 102 mEq/L (ref 96–112)
Creatinine, Ser: 1.08 mg/dL (ref 0.40–1.50)
GFR: 64.46 mL/min (ref >60.00)
Glucose, Bld: 93 mg/dL (ref 70–99)
Potassium: 3.9 mEq/L (ref 3.5–5.1)
Sodium: 141 mEq/L (ref 135–145)
Total Bilirubin: 0.8 mg/dL (ref 0.2–1.2)
Total Protein: 7.1 g/dL (ref 6.0–8.3)

## 2019-09-09 MED ORDER — APIXABAN 5 MG PO TABS: 5.0000 mg | ORAL_TABLET | Freq: Two times a day (BID) | ORAL | 5 refills | Status: DC

## 2019-09-09 NOTE — Telephone Encounter (Signed)
Eliquis 5mg  refill request received, pt is 86yrs old, weight-70.9kg, Crea-1.08 on today, 09/09/2019, Diagnosis-Afib, and last seen by Dr. Curt Bears on 05/07/2019. Dose is appropriate based on dosing criteria.  Refill was sent today by Lenna Sciara, PharmD. Will deny this one since sent today.

## 2019-09-09 NOTE — Progress Notes (Signed)
No critical labs need to be addressed urgently. We will discuss labs in detail at upcoming office visit.   

## 2019-09-09 NOTE — Telephone Encounter (Signed)
Patient has enough for tomorrow AM dose. Labs already collected this AM. Will send in refill as soon as they reslut

## 2019-09-09 NOTE — Telephone Encounter (Signed)
Scr 1 on 09/09/19 86 years old 70.9kg

## 2019-09-10 ENCOUNTER — Ambulatory Visit (INDEPENDENT_AMBULATORY_CARE_PROVIDER_SITE_OTHER): Payer: Medicare Other

## 2019-09-10 VITALS — BP 126/62 | HR 62 | Wt 156.0 lb

## 2019-09-10 DIAGNOSIS — Z0000 Encounter for general adult medical examination without abnormal findings: Secondary | ICD-10-CM | POA: Diagnosis not present

## 2019-09-10 NOTE — Progress Notes (Signed)
Subjective:   [name] is a 86 y.o. male who presents for Medicare Annual/Subsequent preventive examination.  Review of Systems: N/A   This visit is being conducted through telemedicine via telephone at the nurse health advisor's home address due to the COVID-19 pandemic. This patient has given me verbal consent via doximity to conduct this visit, patient states they are participating from their home address. Patient and myself are on the telephone call. There is no referral for this visit. Some vital signs may be absent or patient reported.    Patient identification: identified by name, DOB, and current address   Cardiac Risk Factors include: advanced age (>57men, >53 women);male gender;hypertension     Objective:    Vitals: BP 126/62   Pulse 62   Wt 156 lb (70.8 kg)   BMI 23.72 kg/m   Body mass index is 23.72 kg/m.  Advanced Directives 09/10/2019 03/09/2015  Does Patient Have a Medical Advance Directive? Yes Yes  Type of Paramedic of [place];Living will [place];Living will  Does patient want to make changes to medical advance directive? - No - Patient declined  Copy of [place] in Chart? No - copy requested No - copy requested    Tobacco Social History   Tobacco Use  Smoking Status Former Smoker  . Packs/day: 1.00  . Years: 10.00  . Pack years: 10.00  . Quit date: 12  . Years since quitting: 53.2  Smokeless Tobacco Former Systems developer  . Quit date: 06/06/1966     Counseling given: Not Answered   Clinical Intake:  Pre-visit preparation completed: Yes  Pain : No/denies pain     Nutritional Risks: None Diabetes: No  How often do you need to have someone help you when you read instructions, pamphlets, or other written materials from your doctor or pharmacy?: 1 - Never What is the last grade level you completed in school?: 12th  Interpreter Needed?: No  Information entered by :: CJohnson,  LPN  Past Medical History:  Diagnosis Date  . Arrhythmia   . Arthritis   . Atrial fibrillation ([place])   . Cancer ([place]) 1972  . ED (erectile dysfunction)   . Glaucoma   . History of chicken pox   . Hypertension   . Melanoma ([place])    left abdomen   Past Surgical History:  Procedure Laterality Date  . EP IMPLANTABLE DEVICE N/A 03/09/2015   Procedure: Pacemaker Implant;  Surgeon: Will Meredith Leeds, MD;  Location: [facility];  Service: Cardiovascular;  Laterality: N/A;  . NASAL SINUS SURGERY     Family History  Problem Relation Age of Onset  . Hypertension Mother   . Heart failure Mother   . Heart disease Mother   . Hypertension Father   . Asthma Sister   . Hypertension Sister   . Hypertension Sister   . Learning disabilities Sister    Social History   Socioeconomic History  . Marital status: Widowed    Spouse name: Not on file  . Number of children: 1  . Years of education: 30  . Highest education level: High school graduate  Occupational History  . Not on file  Tobacco Use  . Smoking status: Former Smoker    Packs/day: 1.00    Years: 10.00    Pack years: 10.00    Quit date: 1968    Years since quitting: 53.2  . Smokeless tobacco: Former Systems developer    Quit date: 06/06/1966  Substance and Sexual Activity  . Alcohol use: Not Currently    Alcohol/week: 0.0 standard drinks  . Drug use: No  . Sexual activity: Never  Other Topics Concern  . Not on file  Social History Narrative  . Not on file   Social Determinants of Health   Financial Resource Strain: Low Risk   . Difficulty of Paying Living Expenses: Not hard at all  Food Insecurity: No Food Insecurity  . Worried About Charity fundraiser in the Last Year: Never true  . Ran Out of Food in the Last Year: Never true  Transportation Needs: No Transportation Needs  . Lack of Transportation (Medical): No  . Lack of Transportation (Non-Medical): No  Physical Activity: Inactive  . Days of Exercise per Week: 0  days  . Minutes of Exercise per Session: 0 min  Stress: No Stress Concern Present  . Feeling of Stress : Not at all  Social Connections:   . Frequency of Communication with Friends and Family:   . Frequency of Social Gatherings with Friends and Family:   . Attends Religious Services:   . Active Member of Clubs or Organizations:   . Attends Archivist Meetings:   Marland Kitchen Marital Status:     Outpatient Encounter Medications as of 09/10/2019  Medication Sig  . amLODipine (NORVASC) 10 MG tablet Take 10 mg by mouth daily.  Marland Kitchen apixaban (ELIQUIS) 5 MG TABS tablet Take 1 tablet (5 mg total) by mouth 2 (two) times daily.  . Ascorbic Acid (VITA-C PO) Take 1 tablet by mouth daily.  . cholecalciferol (VITAMIN D) 1000 UNITS tablet Take 1,000 Units by mouth daily.  Marland Kitchen donepezil (ARICEPT) 5 MG tablet Take 1 tablet (5 mg total) by mouth daily.  Marland Kitchen lisinopril (PRINIVIL,ZESTRIL) 20 MG tablet Take 20 mg by mouth daily.  . Multiple Vitamins-Minerals (ZINC PO) Take by mouth.  . triamcinolone cream (KENALOG) 0.1 % APPLY TO AFFECTED AREAS UP TO TWO TIMES DAILY AS NEEDED. AVOID FACE AND UNDERARMS.   No facility-administered encounter medications on file as of 09/10/2019.    Activities of Daily Living In your present state of health, do you have any difficulty performing the following activities: 09/10/2019  Hearing? Y  Comment wears hearing aids  Vision? N  Difficulty concentrating or making decisions? Y  Comment takes medication for memory  Walking or climbing stairs? N  Dressing or bathing? N  Doing errands, shopping? N  Preparing Food and eating ? N  Using the Toilet? N  In the past six months, have you accidently leaked urine? N  Do you have problems with loss of bowel control? N  Managing your Medications? N  Managing your Finances? N  Housekeeping or managing your Housekeeping? N  Some recent data might be hidden    Patient Care Team: Jinny Sanders, MD as PCP - General (Family Medicine)    Assessment:   This is a routine wellness examination for [place].  Exercise Activities and Dietary recommendations Current Exercise Habits: The patient does not participate in regular exercise at present, Exercise limited by: None identified  Goals    . Patient Stated     09/10/2019, I will maintain and continue medications as prescribed.        Fall Risk Fall Risk  09/10/2019  Falls in the past year? 0  Number falls in past yr: 0  Injury with Fall? 0  Risk for fall due to : Medication side effect  Follow up Falls evaluation completed;Falls  prevention discussed   Is the patient's home free of loose throw rugs in walkways, pet beds, electrical cords, etc?   yes      Grab bars in the bathroom? yes      Handrails on the stairs?   yes      Adequate lighting?   yes  Timed Get Up and Go Performed: N/A  Depression Screen PHQ 2/9 Scores 09/10/2019  PHQ - 2 Score 1  PHQ- 9 Score 1    Cognitive Function MMSE - Mini Mental State Exam 09/10/2019  Orientation to time 5  Orientation to Place 5  Registration 3  Attention/ Calculation 0  Attention/Calculation-comments Patient refused to do this  Recall 2  Language- repeat 1       Mini Cog  Mini-Cog screen was completed. Maximum score is 22. A value of 0 denotes this part of the MMSE was not completed or the patient failed this part of the Mini-Cog screening.  Immunization History  Administered Date(s) Administered  . Influenza, High Dose Seasonal PF 03/26/2015, 04/04/2019  . Pneumococcal Conjugate-13 06/02/2014  . Pneumococcal Polysaccharide-23 06/18/2007  . Tdap 09/30/2010  . Zoster Recombinat (Shingrix) 12/31/2018, 04/18/2019    Qualifies for Shingles Vaccine: Completed series   Screening Tests Health Maintenance  Topic Date Due  . INFLUENZA VACCINE  01/05/2020  . TETANUS/TDAP  09/29/2020  . PNA vac Low Risk Adult  Completed   Cancer Screenings: Lung: Low Dose CT Chest recommended if Age 83-80 years, 30 pack-year  currently smoking OR have quit w/in 15 years. Patient does not qualify. Colorectal: no longer required  Additional Screenings:  Hepatitis C Screening: N/A      Plan:   Patient will maintain and continue medications as prescribed.   I have personally reviewed and noted the following in the patient's chart:   . Medical and social history . Use of alcohol, tobacco or illicit drugs  . Current medications and supplements . Functional ability and status . Nutritional status . Physical activity . Advanced directives . List of other physicians . Hospitalizations, surgeries, and ER visits in previous 12 months . Vitals . Screenings to include cognitive, depression, and falls . Referrals and appointments  In addition, I have reviewed and discussed with patient certain preventive protocols, quality metrics, and best practice recommendations. A written personalized care plan for preventive services as well as general preventive health recommendations were provided to patient.     Andrez Grime, LPN  [id number]

## 2019-09-10 NOTE — Progress Notes (Signed)
PCP notes:  Health Maintenance: No gaps noted   Abnormal Screenings: MMSE- score 16, Patient refused to spell world backwards.    Patient concerns: none   Nurse concerns: none   Next PCP appt.: 09/17/2019 @ 10 am

## 2019-09-10 NOTE — Patient Instructions (Signed)
[name] , Thank you for taking time to come for your Medicare Wellness Visit. I appreciate your ongoing commitment to your health goals. Please review the following plan we discussed and let me know if I can assist you in the future.   Screening recommendations/referrals: Colonoscopy: no longer required Recommended yearly ophthalmology/optometry visit for glaucoma screening and checkup Recommended yearly dental visit for hygiene and checkup  Vaccinations: Influenza vaccine: Up to date, completed 04/04/2019 Pneumococcal vaccine: Completed series Tdap vaccine: Up to date, completed 09/30/2010 Shingles vaccine: Completed series    Advanced directives: Please bring a copy of your POA (Power of Attorney) and/or Living Will to your next appointment.   Conditions/risks identified: hypertension  Next appointment: 09/17/2019 @ 10 am   Preventive Care 65 Years and Older, Male Preventive care refers to lifestyle choices and visits with your health care provider that can promote health and wellness. What does preventive care include?  A yearly physical exam. This is also called an annual well check.  Dental exams once or twice a year.  Routine eye exams. Ask your health care provider how often you should have your eyes checked.  Personal lifestyle choices, including:  Daily care of your teeth and gums.  Regular physical activity.  Eating a healthy diet.  Avoiding tobacco and drug use.  Limiting alcohol use.  Practicing safe sex.  Taking low doses of aspirin every day.  Taking vitamin and mineral supplements as recommended by your health care provider. What happens during an annual well check? The services and screenings done by your health care provider during your annual well check will depend on your age, overall health, lifestyle risk factors, and family history of disease. Counseling  Your health care provider may ask you questions about your:  Alcohol use.  Tobacco  use.  Drug use.  Emotional well-being.  Home and relationship well-being.  Sexual activity.  Eating habits.  History of falls.  Memory and ability to understand (cognition).  Work and work Statistician. Screening  You may have the following tests or measurements:  Height, weight, and BMI.  Blood pressure.  Lipid and cholesterol levels. These may be checked every 5 years, or more frequently if you are over 15 years old.  Skin check.  Lung cancer screening. You may have this screening every year starting at age 37 if you have a 30-pack-year history of smoking and currently smoke or have quit within the past 15 years.  Fecal occult blood test (FOBT) of the stool. You may have this test every year starting at age 16.  Flexible sigmoidoscopy or colonoscopy. You may have a sigmoidoscopy every 5 years or a colonoscopy every 10 years starting at age 51.  Prostate cancer screening. Recommendations will vary depending on your family history and other risks.  Hepatitis C blood test.  Hepatitis B blood test.  Sexually transmitted disease (STD) testing.  Diabetes screening. This is done by checking your blood sugar (glucose) after you have not eaten for a while (fasting). You may have this done every 1-3 years.  Abdominal aortic aneurysm (AAA) screening. You may need this if you are a current or former smoker.  Osteoporosis. You may be screened starting at age 26 if you are at high risk. Talk with your health care provider about your test results, treatment options, and if necessary, the need for more tests. Vaccines  Your health care provider may recommend certain vaccines, such as:  Influenza vaccine. This is recommended every year.  Tetanus, diphtheria,  and acellular pertussis (Tdap, Td) vaccine. You may need a Td booster every 10 years.  Zoster vaccine. You may need this after age 81.  Pneumococcal 13-valent conjugate (PCV13) vaccine. One dose is recommended after age  29.  Pneumococcal polysaccharide (PPSV23) vaccine. One dose is recommended after age 35. Talk to your health care provider about which screenings and vaccines you need and how often you need them. This information is not intended to replace advice given to you by your health care provider. Make sure you discuss any questions you have with your health care provider. Document Released: 06/19/2015 Document Revised: 02/10/2016 Document Reviewed: 03/24/2015 Elsevier Interactive Patient Education  2017 [employer] Prevention in the Home Falls can cause injuries. They can happen to people of all ages. There are many things you can do to make your home safe and to help prevent falls. What can I do on the outside of my home?  Regularly fix the edges of walkways and driveways and fix any cracks.  Remove anything that might make you trip as you walk through a door, such as a raised step or threshold.  Trim any bushes or trees on the path to your home.  Use bright outdoor lighting.  Clear any walking paths of anything that might make someone trip, such as rocks or tools.  Regularly check to see if handrails are loose or broken. Make sure that both sides of any steps have handrails.  Any raised decks and porches should have guardrails on the edges.  Have any leaves, snow, or ice cleared regularly.  Use sand or salt on walking paths during winter.  Clean up any spills in your garage right away. This includes oil or grease spills. What can I do in the bathroom?  Use night lights.  Install grab bars by the toilet and in the tub and shower. Do not use towel bars as grab bars.  Use non-skid mats or decals in the tub or shower.  If you need to sit down in the shower, use a plastic, non-slip stool.  Keep the floor dry. Clean up any water that spills on the floor as soon as it happens.  Remove soap buildup in the tub or shower regularly.  Attach bath mats securely with double-sided  non-slip rug tape.  Do not have throw rugs and other things on the floor that can make you trip. What can I do in the bedroom?  Use night lights.  Make sure that you have a light by your bed that is easy to reach.  Do not use any sheets or blankets that are too big for your bed. They should not hang down onto the floor.  Have a firm chair that has side arms. You can use this for support while you get dressed.  Do not have throw rugs and other things on the floor that can make you trip. What can I do in the kitchen?  Clean up any spills right away.  Avoid walking on wet floors.  Keep items that you use a lot in easy-to-reach places.  If you need to reach something above you, use a strong step stool that has a grab bar.  Keep electrical cords out of the way.  Do not use floor polish or wax that makes floors slippery. If you must use wax, use non-skid floor wax.  Do not have throw rugs and other things on the floor that can make you trip. What can I do with my  stairs?  Do not leave any items on the stairs.  Make sure that there are handrails on both sides of the stairs and use them. Fix handrails that are broken or loose. Make sure that handrails are as long as the stairways.  Check any carpeting to make sure that it is firmly attached to the stairs. Fix any carpet that is loose or worn.  Avoid having throw rugs at the top or bottom of the stairs. If you do have throw rugs, attach them to the floor with carpet tape.  Make sure that you have a light switch at the top of the stairs and the bottom of the stairs. If you do not have them, ask someone to add them for you. What else can I do to help prevent falls?  Wear shoes that:  Do not have high heels.  Have rubber bottoms.  Are comfortable and fit you well.  Are closed at the toe. Do not wear sandals.  If you use a stepladder:  Make sure that it is fully opened. Do not climb a closed stepladder.  Make sure that both  sides of the stepladder are locked into place.  Ask someone to hold it for you, if possible.  Clearly mark and make sure that you can see:  Any grab bars or handrails.  First and last steps.  Where the edge of each step is.  Use tools that help you move around (mobility aids) if they are needed. These include:  Canes.  Walkers.  Scooters.  Crutches.  Turn on the lights when you go into a dark area. Replace any light bulbs as soon as they burn out.  Set up your furniture so you have a clear path. Avoid moving your furniture around.  If any of your floors are uneven, fix them.  If there are any pets around you, be aware of where they are.  Review your medicines with your doctor. Some medicines can make you feel dizzy. This can increase your chance of falling. Ask your doctor what other things that you can do to help prevent falls. This information is not intended to replace advice given to you by your health care provider. Make sure you discuss any questions you have with your health care provider. Document Released: 03/19/2009 Document Revised: 10/29/2015 Document Reviewed: 06/27/2014 Elsevier Interactive Patient Education  2017 [employer].

## 2019-09-17 ENCOUNTER — Encounter: Payer: Self-pay | Admitting: Family Medicine

## 2019-09-17 ENCOUNTER — Ambulatory Visit (INDEPENDENT_AMBULATORY_CARE_PROVIDER_SITE_OTHER): Payer: Medicare Other | Admitting: Family Medicine

## 2019-09-17 ENCOUNTER — Other Ambulatory Visit: Payer: Self-pay

## 2019-09-17 VITALS — BP 126/70 | HR 67 | Temp 97.5°F | Ht 67.5 in | Wt 155.0 lb

## 2019-09-17 DIAGNOSIS — R351 Nocturia: Secondary | ICD-10-CM

## 2019-09-17 DIAGNOSIS — Z0000 Encounter for general adult medical examination without abnormal findings: Secondary | ICD-10-CM

## 2019-09-17 DIAGNOSIS — I480 Paroxysmal atrial fibrillation: Secondary | ICD-10-CM | POA: Diagnosis not present

## 2019-09-17 DIAGNOSIS — F0390 Unspecified dementia without behavioral disturbance: Secondary | ICD-10-CM

## 2019-09-17 DIAGNOSIS — N401 Enlarged prostate with lower urinary tract symptoms: Secondary | ICD-10-CM

## 2019-09-17 DIAGNOSIS — I10 Essential (primary) hypertension: Secondary | ICD-10-CM | POA: Diagnosis not present

## 2019-09-17 DIAGNOSIS — F03A0 Unspecified dementia, mild, without behavioral disturbance, psychotic disturbance, mood disturbance, and anxiety: Secondary | ICD-10-CM

## 2019-09-17 NOTE — Assessment & Plan Note (Signed)
He will try moving fluid earlier in day.. if not effective we will start flomax trial.

## 2019-09-17 NOTE — Assessment & Plan Note (Signed)
Stab;le and no SE on aricept.

## 2019-09-17 NOTE — Progress Notes (Signed)
Chief Complaint  Patient presents with  . Annual Exam    History of Present Illness: HPI   The patient presents for annual medicare wellness, complete physical and review of chronic health problems. He/She also has the following acute concerns today:  The patient saw a LPN or RN for medicare wellness visit.  Prevention and wellness was reviewed in detail. Note reviewed  From 09/10/2019 and important notes copied below.  Health Maintenance: No gaps noted Abnormal Screenings: MMSE- score 16, Patient refused to spell world backwards.     Advance directives and end of life planning reviewed in detail with patient and documented in EMR. Patient given handout on advance care directives if needed. HCPOA and living will updated if needed.  Fall Risk  09/10/2019  Falls in the past year? 0  Number falls in past yr: 0  Injury with Fall? 0  Risk for fall due to : Medication side effect  Follow up Falls evaluation completed;Falls prevention discussed    Hearing Screening   125Hz  250Hz  500Hz  1000Hz  2000Hz  3000Hz  4000Hz  6000Hz  8000Hz   Right ear:           Left ear:           Comments: Patient has hearing aids   Vision Screening Comments: Patient had eye exam 05/07/19 - Has glasses  PHQ9 SCORE ONLY 09/17/2019 09/10/2019  Score 1 1    Hypertension:   Good control on amlodipine and lisinopril  BP Readings from Last 3 Encounters:  09/17/19 126/70  09/10/19 126/62  07/11/19 (!) 116/56  Using medication without problems or lightheadedness:  none Chest pain with exertion:none Edema:none Short of breath:none Average home BPs: Other issues:  Followed by cardiology for Afib on eliquis and AV heart block  Last OV 05/27/19 Dr. Curt Bears (reviewed note in detail)  Reviewed CMET and CBC : NML  Mild dementia  Stable cpontrol per pt on Aricept. No SE to it.  Currently having trismusand left jaw pain Followed by Dr. Constance Holster... HAs upcoming CT scan.  Hx of skin cancer on lower lip  Nocturia  ( 3  -4 times a night) and dribbling flow, no dysuria, no abd pain, no fever.  Drinks a lot of liquid at bedtime.  This visit occurred during the SARS-CoV-2 public health emergency.  Safety protocols were in place, including screening questions prior to the visit, additional usage of staff PPE, and extensive cleaning of exam room while observing appropriate contact time as indicated for disinfecting solutions.   COVID 19 screen:  No recent travel or known exposure to COVID19 The patient denies respiratory symptoms of COVID 19 at this time. The importance of social distancing was discussed today.     Review of Systems  Constitutional: Negative for chills and fever.  HENT: Negative for congestion and ear pain.   Eyes: Negative for pain and redness.  Respiratory: Negative for cough and shortness of breath.   Cardiovascular: Negative for chest pain, palpitations and leg swelling.  Gastrointestinal: Negative for abdominal pain, blood in stool, constipation, diarrhea, nausea and vomiting.  Genitourinary: Positive for frequency. Negative for dysuria, flank pain, hematuria and urgency.  Musculoskeletal: Negative for falls and myalgias.  Skin: Negative for rash.  Neurological: Negative for dizziness.  Psychiatric/Behavioral: Negative for depression. The patient is not nervous/anxious.       Past Medical History:  Diagnosis Date  . Arrhythmia   . Arthritis   . Atrial fibrillation ([place])   . Cancer ([place]) 1972  . ED (erectile dysfunction)   .  Glaucoma   . History of chicken pox   . Hypertension   . Melanoma ([place])    left abdomen    reports that he quit smoking about 53 years ago. He has a 10.00 pack-year smoking history. He quit smokeless tobacco use about 53 years ago. He reports previous alcohol use. He reports that he does not use drugs.   Current Outpatient Medications:  .  amLODipine (NORVASC) 10 MG tablet, Take 10 mg by mouth daily., Disp: , Rfl: 3 .  apixaban (ELIQUIS) 5 MG TABS  tablet, Take 1 tablet (5 mg total) by mouth 2 (two) times daily., Disp: 60 tablet, Rfl: 5 .  Ascorbic Acid (VITA-C PO), Take 1 tablet by mouth daily., Disp: , Rfl:  .  cholecalciferol (VITAMIN D) 1000 UNITS tablet, Take 1,000 Units by mouth daily., Disp: , Rfl:  .  donepezil (ARICEPT) 5 MG tablet, Take 1 tablet (5 mg total) by mouth daily., Disp: 90 tablet, Rfl: 3 .  lisinopril (PRINIVIL,ZESTRIL) 20 MG tablet, Take 20 mg by mouth daily., Disp: , Rfl: 3 .  Multiple Vitamins-Minerals (ZINC PO), Take by mouth., Disp: , Rfl:  .  triamcinolone cream (KENALOG) 0.1 %, APPLY TO AFFECTED AREAS UP TO TWO TIMES DAILY AS NEEDED. AVOID FACE AND UNDERARMS., Disp: , Rfl: 1   Observations/Objective: Blood pressure 126/70, pulse 67, temperature (!) 97.5 F (36.4 C), temperature source Temporal, height 5' 7.5" (1.715 m), weight 155 lb (70.3 kg), SpO2 96 %.  Physical Exam Constitutional:      Appearance: He is well-developed.     Comments: elderly  HENT:     Head: Normocephalic.     Right Ear: Hearing normal.     Left Ear: Hearing normal.     Nose: Nose normal.  Neck:     Thyroid: No thyroid mass or thyromegaly.     Vascular: No carotid bruit.     Trachea: Trachea normal.  Cardiovascular:     Rate and Rhythm: Normal rate and regular rhythm.     Pulses: Normal pulses.     Heart sounds: Heart sounds not distant. No murmur. No friction rub. No gallop.      Comments: No peripheral edema Pulmonary:     Effort: Pulmonary effort is normal. No respiratory distress.     Breath sounds: Normal breath sounds.  Skin:    General: Skin is warm and dry.     Findings: No rash.  Psychiatric:        Speech: Speech normal.        Behavior: Behavior normal.        Thought Content: Thought content normal.      Assessment and Plan The patient's preventative maintenance and recommended screening tests for an annual wellness exam were reviewed in full today. Brought up to date unless services  declined.  Counselled on the importance of diet, exercise, and its role in overall health and mortality. The patient's FH and SH was reviewed, including their home life, tobacco status, and drug and alcohol status.   Vaccines:uptodate with flu, td, PNA and shingles  Discussed COVID19 vaccine side effects and benefits. Strongly encouraged the patient to get the vaccine. Questions answered.  Prostate Cancer Screen: not indicated due to age Colon Cancer Screen: not indicated due to age.      Smoking Status: former ETOH/ drug [id number]  Mild dementia ([place]) Stab;le and no SE on aricept.  Essential hypertension Well controlled. Continue current medication.   BPH associated with nocturia He will  try moving fluid earlier in day.. if not effective we will start flomax trial.  Paroxysmal atrial fibrillation ([place])  Followed by Cardiology. Rate controlled.      Eliezer Lofts, MD

## 2019-09-17 NOTE — Patient Instructions (Addendum)
Consider COVID 19 vaccine as discussed. Call if you or your family think your memory is worsening for increase in Aricept ( donezepil).   Call if you feel moving liquids earlier in the day is not helping enough with nighttime urination.

## 2019-09-17 NOTE — Assessment & Plan Note (Signed)
Followed by Cardiology. Rate controlled.

## 2019-09-17 NOTE — Assessment & Plan Note (Signed)
Well controlled. Continue current medication.  

## 2019-09-19 ENCOUNTER — Other Ambulatory Visit: Payer: Self-pay | Admitting: Otolaryngology

## 2019-09-19 DIAGNOSIS — Z85828 Personal history of other malignant neoplasm of skin: Secondary | ICD-10-CM

## 2019-09-19 DIAGNOSIS — R252 Cramp and spasm: Secondary | ICD-10-CM

## 2019-09-23 ENCOUNTER — Ambulatory Visit
Admission: RE | Admit: 2019-09-23 | Discharge: 2019-09-23 | Disposition: A | Discharge status: Home / Self Care | Payer: Medicare Other | Source: Ambulatory Visit | Attending: Otolaryngology | Admitting: Otolaryngology

## 2019-09-23 DIAGNOSIS — J329 Chronic sinusitis, unspecified: Secondary | ICD-10-CM | POA: Diagnosis not present

## 2019-09-23 DIAGNOSIS — R252 Cramp and spasm: Secondary | ICD-10-CM

## 2019-09-23 DIAGNOSIS — Z85828 Personal history of other malignant neoplasm of skin: Secondary | ICD-10-CM

## 2019-09-23 MED ORDER — IOPAMIDOL (ISOVUE-300) INJECTION 61%
75.0000 mL | Freq: Once | INTRAVENOUS | Status: AC | PRN
  Administered 2019-09-23: 75 mL via INTRAVENOUS

## 2019-09-27 DIAGNOSIS — B029 Zoster without complications: Secondary | ICD-10-CM | POA: Diagnosis not present

## 2019-10-17 ENCOUNTER — Ambulatory Visit: Payer: Medicare Other | Admitting: Family Medicine

## 2019-10-18 ENCOUNTER — Encounter: Payer: Self-pay | Admitting: Family Medicine

## 2019-10-18 ENCOUNTER — Telehealth: Payer: Self-pay

## 2019-10-18 ENCOUNTER — Other Ambulatory Visit: Payer: Self-pay

## 2019-10-18 ENCOUNTER — Ambulatory Visit: Payer: Medicare Other | Admitting: Family Medicine

## 2019-10-18 VITALS — BP 110/70 | HR 74 | Temp 97.8°F | Ht 67.5 in | Wt 156.5 lb

## 2019-10-18 DIAGNOSIS — H9202 Otalgia, left ear: Secondary | ICD-10-CM

## 2019-10-18 DIAGNOSIS — G8929 Other chronic pain: Secondary | ICD-10-CM

## 2019-10-18 DIAGNOSIS — R6884 Jaw pain: Secondary | ICD-10-CM | POA: Diagnosis not present

## 2019-10-18 MED ORDER — GABAPENTIN 100 MG PO CAPS
ORAL_CAPSULE | ORAL | 3 refills | Status: DC
Start: 2019-10-18 — End: 2019-11-01

## 2019-10-18 MED ORDER — LISINOPRIL 20 MG PO TABS: 20.0000 mg | ORAL_TABLET | Freq: Every day | ORAL | 3 refills | Status: DC

## 2019-10-18 MED ORDER — AMLODIPINE BESYLATE 10 MG PO TABS: 10.0000 mg | ORAL_TABLET | Freq: Every day | ORAL | 3 refills | Status: DC

## 2019-10-18 NOTE — Telephone Encounter (Signed)
[place] Night - Client Nonclinical Telephone Record AccessNurse Client [facility] Night - Client Client Site [place] - Night Physician Eliezer Lofts - MD Contact Type Call Who Is Calling Patient / Member / Family / Caregiver Caller Name [place] Phone Number [phone] Patient Name [name] Patient DOB [birth year] Call Type Message Only Information Provided Reason for Call Request for General Office Information Initial Comment Caller states he made an appt for today. He is calling to confirm appt for 10:30am Additional Comment NOTE: Please call him back. This should have been for Daytime. Disp. Time Disposition Final User 10/18/2019 8:15:39 AM General Information Provided Yes Artis Flock Call Closed By: Artis Flock Transaction Date/Time: 10/18/2019 8:10:34 AM (ET)

## 2019-10-18 NOTE — Telephone Encounter (Signed)
[name] - Client Nonclinical Telephone Record AccessNurse Client [name] - Client Client Site [place] - [name] Physician Eliezer Lofts - MD Contact Type Call Who Is Calling Patient / Member / Family / Caregiver Caller Name Baris Pajak Caller Phone Number [phone] Call Type Message Only Information Provided Reason for Call Returning a Call from the Office Initial [place] states, Confirming your 10:30am appt tomorrow . Additional Comment Disp. Time Disposition Final User 10/17/2019 6:13:53 PM General Information Provided Yes Jobie Quaker Call Closed By: Jobie Quaker Transaction Date/Time: 10/17/2019 6:10:54 PM (ET)

## 2019-10-18 NOTE — Telephone Encounter (Signed)
[place] [facility] - Client Nonclinical Telephone Record AccessNurse Client [place] [facility] - Client Client Site [place] - Day Physician Eliezer Lofts - MD Contact Type Call Who Is Calling Patient / Member / Family / Caregiver Caller Name [place] Phone Number [phone] Patient Name Jameion Verdugo Patient DOB 09/15/1928 Call Type Message Only Information Provided Reason for Call Request for General Office Information Initial Comment Please call patient to confirm his 10:30am appt. Additional Comment Disp. Time Disposition Final User 10/18/2019 8:14:54 AM General Information Provided Yes Artis Flock Call Closed

## 2019-10-18 NOTE — Assessment & Plan Note (Signed)
Likely trigeminal neuralgia.  No sig dental issue per dentist.  No TMJ and not improved with diclofenac treatment.

## 2019-10-18 NOTE — Telephone Encounter (Signed)
Per chart review tab pt already seen by Dr Diona Browner.

## 2019-10-18 NOTE — Patient Instructions (Addendum)
Start gabapentin 1 capsule at bedtime. If tolerating but still pain in ear/jaw after 1 week. Try 1 capsule twice daily.  Stop diclofenac cream/gel. Plan if not improving eval with MRI to rule out any other structural lesions and possible trial of carbamazepine.

## 2019-10-18 NOTE — Assessment & Plan Note (Signed)
Ongoing now for 4 months intermittent. Neg ear eval per ENT, no clear rash/shingles, CT maxillofacial unremarkable.  Possible trigeminal neuralgia. No clear cause.  Will try a trial of gabapentin. Given no clear cause, if not improving with gabapentin.. consider eval with MRI. Also if gabapentin causes sedation or SE.. can try a course of carbamazepine or referral to Neuro.

## 2019-10-18 NOTE — Progress Notes (Signed)
Chief Complaint  Patient presents with  . Jaw Pain    Left-Radiates to ear    History of Present Illness: HPI 86 year old male with history of  presents for  Left jaw pain.  He and his son report the pain started several months ago  Pain is intermittent but some days all day.  pain is in side his ear and there is a tender spot on left lateral jaw .   5/10 on pain scale. Throbbing pain  Pain is worse with walking and chewing.  Saw Derm Dr. [place] Crane.. treated for shingles with no relief. Lidocaine and valtrex.  Precancerous lesions on  lower lip has been removed over last 2 years... since  Then he has had numbness in left lower lip.    He has seen ENT Dr. Constance Holster ( reviewed studies and ENT note in detail)  CT  Maxillofacial 09/23/2019:  1. No acute, inflammatory, or malignant finding identified in the Face. No explanation for symptoms. 2. Evidence of chronic rhinosinusitis. Postoperative changes to the right [facility] which is patent  Dentist did X-rays: per report these were negative.  S/P topical diclofenac for possible TMJ... did not help.  Hypertension:    Well controlled.. refilled BP meds:amlodipine and lisinopril. BP Readings from Last 3 Encounters:  10/18/19 110/70  09/17/19 126/70  09/10/19 126/62  Using medication without problems or lightheadedness:  Chest pain with exertion: Edema: Short of breath: Average home BPs: Other issues:   This visit occurred during the SARS-CoV-2 public health emergency.  Safety protocols were in place, including screening questions prior to the visit, additional usage of staff PPE, and extensive cleaning of exam room while observing appropriate contact time as indicated for disinfecting solutions.   COVID 19 screen:  No recent travel or known exposure to COVID19 The patient denies respiratory symptoms of COVID 19 at this time. The importance of social distancing was discussed today.     Review of Systems  Constitutional: Negative for  chills and fever.  HENT: Negative for congestion and ear pain.   Eyes: Negative for pain and redness.  Respiratory: Negative for cough and shortness of breath.   Cardiovascular: Negative for chest pain, palpitations and leg swelling.  Gastrointestinal: Negative for abdominal pain, blood in stool, constipation, diarrhea, nausea and vomiting.  Genitourinary: Negative for dysuria.  Musculoskeletal: Negative for falls and myalgias.  Skin: Negative for rash.  Neurological: Negative for dizziness.  Psychiatric/Behavioral: Negative for depression. The patient is not nervous/anxious.       Past Medical History:  Diagnosis Date  . Arrhythmia   . Arthritis   . Atrial fibrillation ([place])   . Cancer ([place]) 1972  . ED (erectile dysfunction)   . Glaucoma   . History of chicken pox   . Hypertension   . Melanoma ([place])    left abdomen    reports that he quit smoking about 53 years ago. He has a 10.00 pack-year smoking history. He quit smokeless tobacco use about 53 years ago. He reports previous alcohol use. He reports that he does not use drugs.   Current Outpatient Medications:  .  amLODipine (NORVASC) 10 MG tablet, Take 10 mg by mouth daily., Disp: , Rfl: 3 .  apixaban (ELIQUIS) 5 MG TABS tablet, Take 1 tablet (5 mg total) by mouth 2 (two) times daily., Disp: 60 tablet, Rfl: 5 .  Ascorbic Acid (VITA-C PO), Take 1 tablet by mouth daily., Disp: , Rfl:  .  cholecalciferol (VITAMIN D) 1000 UNITS tablet,  Take 1,000 Units by mouth daily., Disp: , Rfl:  .  donepezil (ARICEPT) 5 MG tablet, Take 1 tablet (5 mg total) by mouth daily., Disp: 90 tablet, Rfl: 3 .  lisinopril (PRINIVIL,ZESTRIL) 20 MG tablet, Take 20 mg by mouth daily., Disp: , Rfl: 3 .  Multiple Vitamins-Minerals (ZINC PO), Take by mouth., Disp: , Rfl:  .  triamcinolone cream (KENALOG) 0.1 %, APPLY TO AFFECTED AREAS UP TO TWO TIMES DAILY AS NEEDED. AVOID FACE AND UNDERARMS., Disp: , Rfl: 1   Observations/Objective: Blood pressure 110/70,  pulse 74, temperature 97.8 F (36.6 C), temperature source Temporal, height 5' 7.5" (1.715 m), weight 156 lb 8 oz (71 kg), SpO2 98 %.  Physical Exam Constitutional:      Appearance: He is well-developed.  HENT:     Head: Normocephalic.     Jaw: No tenderness, pain on movement or malocclusion.     Salivary Glands: Right salivary gland is not diffusely enlarged or tender. Left salivary gland is not diffusely enlarged or tender.     Comments: No curretn tenderness to palpation.. but pt points to area of pain in id mandible on left and deep in ear    Right Ear: Decreased hearing noted. No tenderness. No middle ear effusion. There is no impacted cerumen.     Left Ear: Decreased hearing noted. No tenderness.  No middle ear effusion. There is no impacted cerumen.     Ears:     Comments: No current tenderness in ear canal.    Nose: Nose normal.  Neck:     Thyroid: No thyroid mass or thyromegaly.     Vascular: No carotid bruit.     Trachea: Trachea normal.  Cardiovascular:     Rate and Rhythm: Normal rate and regular rhythm.     Pulses: Normal pulses.     Heart sounds: Heart sounds not distant. No murmur. No friction rub. No gallop.      Comments: No peripheral edema Pulmonary:     Effort: Pulmonary effort is normal. No respiratory distress.     Breath sounds: Normal breath sounds.  Skin:    General: Skin is warm and dry.     Findings: No rash.  Psychiatric:        Speech: Speech normal.        Behavior: Behavior normal.        Thought Content: Thought content normal.      Assessment and Plan    Chronic left ear pain  Ongoing now for 4 months intermittent. Neg ear eval per ENT, no clear rash/shingles, CT maxillofacial unremarkable.  Possible trigeminal neuralgia. No clear cause.  Will try a trial of gabapentin. Given no clear cause, if not improving with gabapentin.. consider eval with MRI. Also if gabapentin causes sedation or SE.. can try a course of carbamazepine or referral  to Neuro.  Chronic jaw pain Likely trigeminal neuralgia.  No sig dental issue per dentist.  No TMJ and not improved with diclofenac treatment.    Eliezer Lofts, MD

## 2019-10-21 ENCOUNTER — Ambulatory Visit (INDEPENDENT_AMBULATORY_CARE_PROVIDER_SITE_OTHER): Payer: Medicare Other | Admitting: *Deleted

## 2019-10-21 DIAGNOSIS — I4430 Unspecified atrioventricular block: Secondary | ICD-10-CM

## 2019-10-21 LAB — CUP PACEART REMOTE DEVICE CHECK
Battery Remaining Longevity: 124 mo
Battery Remaining Percentage: 95.5 %
Battery Voltage: 2.98 V
Brady Statistic AP VP Percent: 63 %
Brady Statistic AP VS Percent: 1 %
Brady Statistic AS VP Percent: 34 %
Brady Statistic AS VS Percent: 1 %
Brady Statistic RA Percent Paced: 61 %
Brady Statistic RV Percent Paced: 97 %
Date Time Interrogation Session: 20210517020013
Implantable Lead Implant Date: 20161003
Implantable Lead Implant Date: 20161003
Implantable Lead Location: 753859
Implantable Lead Location: 753860
Implantable Pulse Generator Implant Date: 20161003
Lead Channel Impedance Value: 390 Ohm
Lead Channel Impedance Value: 430 Ohm
Lead Channel Pacing Threshold Amplitude: 0.5 V
Lead Channel Pacing Threshold Amplitude: 0.625 V
Lead Channel Pacing Threshold Pulse Width: 0.4 ms
Lead Channel Pacing Threshold Pulse Width: 0.4 ms
Lead Channel Sensing Intrinsic Amplitude: 5 mV
Lead Channel Sensing Intrinsic Amplitude: 8.7 mV
Lead Channel Setting Pacing Amplitude: 0.875
Lead Channel Setting Pacing Amplitude: 1.5 V
Lead Channel Setting Pacing Pulse Width: 0.4 ms
Lead Channel Setting Sensing Sensitivity: 2 mV
Pulse Gen Model: 2240
Pulse Gen Serial Number: 7818292

## 2019-10-22 NOTE — Progress Notes (Signed)
Remote pacemaker transmission.   

## 2019-10-28 ENCOUNTER — Telehealth: Payer: Self-pay | Admitting: Emergency Medicine

## 2019-10-28 NOTE — Telephone Encounter (Signed)
Start metoprolol 25 mg BID. [name] need follow up with me or EP app to discuss.

## 2019-10-28 NOTE — Telephone Encounter (Signed)
Alert received for episode of NSVT that lasted 40 beats that occurred 10/26/19 at 1544. Patient reports he has not had any CP, palpitations, SOB, dizziness. Patient reports ha has been having pain in his jaw and ear and has been seemn by ENT , dentist an will have follow up with PCP.ED precautions given for any of the above mentioned symptoms.

## 2019-10-28 NOTE — Telephone Encounter (Signed)
Patient takes his bp and pulse every morning. BP today was 125/68 and pulse was 61 bpm with automatic BP cuff. Patient scheduled to see Charlcie Cradle PA 11/01/19 @ 1300. Patient will see EP app before starting metoprolol 25 mg BID due to pulse rates in low 60s in the mornings.

## 2019-11-01 ENCOUNTER — Ambulatory Visit: Payer: Medicare Other | Admitting: Family Medicine

## 2019-11-01 ENCOUNTER — Ambulatory Visit: Payer: Medicare Other | Admitting: Physician Assistant

## 2019-11-01 ENCOUNTER — Encounter: Payer: Self-pay | Admitting: Family Medicine

## 2019-11-01 ENCOUNTER — Other Ambulatory Visit: Payer: Self-pay

## 2019-11-01 VITALS — BP 140/70 | HR 65 | Temp 98.1°F | Ht 67.5 in | Wt 156.2 lb

## 2019-11-01 VITALS — BP 132/64 | HR 65 | Ht 67.5 in | Wt 156.0 lb

## 2019-11-01 DIAGNOSIS — I472 Ventricular tachycardia: Secondary | ICD-10-CM

## 2019-11-01 DIAGNOSIS — H9202 Otalgia, left ear: Secondary | ICD-10-CM | POA: Diagnosis not present

## 2019-11-01 DIAGNOSIS — I4729 Other ventricular tachycardia: Secondary | ICD-10-CM

## 2019-11-01 DIAGNOSIS — I4430 Unspecified atrioventricular block: Secondary | ICD-10-CM

## 2019-11-01 DIAGNOSIS — G8929 Other chronic pain: Secondary | ICD-10-CM

## 2019-11-01 DIAGNOSIS — Z950 Presence of cardiac pacemaker: Secondary | ICD-10-CM

## 2019-11-01 DIAGNOSIS — N529 Male erectile dysfunction, unspecified: Secondary | ICD-10-CM | POA: Insufficient documentation

## 2019-11-01 DIAGNOSIS — I480 Paroxysmal atrial fibrillation: Secondary | ICD-10-CM | POA: Diagnosis not present

## 2019-11-01 DIAGNOSIS — I10 Essential (primary) hypertension: Secondary | ICD-10-CM

## 2019-11-01 MED ORDER — METOPROLOL TARTRATE 25 MG PO TABS
25.0000 mg | ORAL_TABLET | Freq: Two times a day (BID) | ORAL | 2 refills | Status: DC
Start: 2019-11-01 — End: 2020-07-17

## 2019-11-01 MED ORDER — SILDENAFIL CITRATE 100 MG PO TABS: 50.0000 mg | ORAL_TABLET | Freq: Every day | ORAL | 11 refills | Status: DC | PRN

## 2019-11-01 NOTE — Progress Notes (Signed)
Cardiology Office Note Date:  11/01/2019  Patient ID:  [name], DOB 02/20/1929, MRN [id number] PCP:  Jinny Sanders, MD  Cardiologist/EP: Dr. Curt Bears     Chief Complaint:  NSVT, medication f/u  History of Present Illness: [name] is a 86 y.o. male with history of HTN, Advanced heart block w/PPM, HTN, PAfib.  He comes in today to be seen for dr. Curt Bears.  Last seen by him 05/07/2019, was doing well.  Noted he had some AV conduction intermittently that was affecting timing cycles and his AV dely was extended to 154ms.Marland Kitchen  No changes made otherwise.  10/28/2019: Device clinic alert for NSVT, 12 seconds, asymptomatic.  Had been bothered recently with jaw and ear pain seeing ENT and dentist, pending f/u with PMD. Dr. Curt Bears started him on Lopressor 25mg  BID and asked he be brought in for evaluation. RN follow up reports pt was not going to start lopressor until seen 2/2 HR low 60's in the AMs   He feels well outside of his ear and facial pain.  This started in March.  He has been to his dentist and ENT who suspect he had/has shingle in his ear/on that nerve.  For some time opening his mouth to eat was painful.  He has not had any kind of rash, skin lesions.  His PMD he says did not think was shingles.  It is improving though still gets nagging ear ache.  His wife died in 07/22/2022.  He has been struggling to get things figured out, caring for his home, keeping up with all that she did.  He does not exercise but keeps up around the house.  Not doing as much gardening now that he has to do ither stuff. NO SOB, DOE, no dizzy spells, near syncope or syncope. When asked about CP, he mentions an infrequent random sharp/fleeting pain once in a while L chest.  None otherwise.  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 Arrhythmia   . Arthritis   . Atrial fibrillation ([place])   . Cancer ([place])  1972  . ED (erectile dysfunction)   . Glaucoma   . History of chicken pox   . Hypertension   . Melanoma ([place])    left abdomen    Past Surgical History:  Procedure Laterality Date  . EP IMPLANTABLE DEVICE N/A 03/09/2015   Procedure: Pacemaker Implant;  Surgeon: Will Meredith Leeds, MD;  Location: [facility];  Service: Cardiovascular;  Laterality: N/A;  . NASAL SINUS SURGERY      Current Outpatient Medications  Medication Sig Dispense Refill  . amLODipine (NORVASC) 10 MG tablet Take 1 tablet (10 mg total) by mouth daily. 90 tablet 3  . apixaban (ELIQUIS) 5 MG TABS tablet Take 1 tablet (5 mg total) by mouth 2 (two) times daily. 60 tablet 5  . Ascorbic Acid (VITA-C PO) Take 1 tablet by mouth daily.    . cholecalciferol (VITAMIN D) 1000 UNITS tablet Take 1,000 Units by mouth daily.    Marland Kitchen donepezil (ARICEPT) 5 MG tablet Take 1 tablet (5 mg total) by mouth daily. 90 tablet 3  . gabapentin (NEURONTIN) 100 MG capsule 1 tablet in morning and 3 tabs at bedtime    . lisinopril (ZESTRIL) 20 MG tablet Take 1 tablet (20 mg total) by mouth daily. 90 tablet 3  . Multiple Vitamins-Minerals (ZINC PO) Take by mouth.    Marland Kitchen  sildenafil (VIAGRA) 100 MG tablet Take 0.5-1 tablets (50-100 mg total) by mouth daily as needed for erectile dysfunction. 10 tablet 11  . triamcinolone cream (KENALOG) 0.1 % APPLY TO AFFECTED AREAS UP TO TWO TIMES DAILY AS NEEDED. AVOID FACE AND UNDERARMS.  1  . valACYclovir (VALTREX) 1000 MG tablet Take 1,000 mg by mouth 2 (two) times daily.    . Zinc Sulfate (ZINC 15 PO) Take by mouth daily.     No current facility-administered medications for this visit.    Allergies:   Avelox [moxifloxacin hcl in nacl], Moxifloxacin, and Sulfa antibiotics   Social History:  The patient  reports that he quit smoking about 53 years ago. He has a 10.00 pack-year smoking history. He quit smokeless tobacco use about 53 years ago. He reports previous alcohol use. He reports that he does not use  drugs.   Family History:  The patient's family history includes Asthma in his sister; Heart disease in his mother; Heart failure in his mother; Hypertension in his father, mother, sister, and sister; Learning disabilities in his sister.   ROS:  Please see the history of present illness.  All other systems are reviewed and otherwise negative.   PHYSICAL EXAM:  VS:  BP 132/64   Pulse 65   Ht 5' 7.5" (1.715 m)   Wt 156 lb (70.8 kg)   BMI 24.07 kg/m  BMI: Body mass index is 24.07 kg/m. Well nourished, well developed, in no acute distress  HEENT: normocephalic, atraumatic  Neck: no JVD, carotid bruits or masses Cardiac:  RRR; no significant murmurs, no rubs, or gallops Lungs:  CTA b/l, no wheezing, rhonchi or rales  Abd: soft, nontender MS: no deformity, age appropriate atrophy Ext:  no edema  Skin: warm and dry, no rash Neuro:  No gross deficits appreciated Psych: euthymic mood, full affect  PPM site is stable, no tethering or discomfort, he is thin, skin looks OK, no thinning   EKG:  Done today and reviewed by myself shows  SR/V paced   PPM interrogation done today and reviewed by myself: Battery and lead measurements are good AP 61% VP 97% NSVT (seen by his remote) 12 seconds AMS 96 episodes, longest 23minute 44 seconds, not all ar e AFib, some look ST with V pacing    Recent Labs: 09/09/2019: ALT 12; BUN 20; Creatinine, Ser 1.08; Hemoglobin 14.6; Platelets 187.0; Potassium 3.9; Sodium 141  No results found for requested labs within last 8760 hours.   CrCl cannot be calculated (Patient's most recent lab result is older than the maximum 21 days allowed.).   Wt Readings from Last 3 Encounters:  11/01/19 156 lb (70.8 kg)  11/01/19 156 lb 4 oz (70.9 kg)  10/18/19 156 lb 8 oz (71 kg)     Other studies reviewed: Additional studies/records reviewed today include: summarized above  ASSESSMENT AND PLAN:  1. PPM     Intact function, no programming changes made  2.  Paroxysmal AFib     CHA2DS2Vasc is 3, on Eliquis, appropriately dosed     <1% burden  3. HTN     Looks good, has room for lopressor   4. NSVT     Was asymptomatic     No symptoms to suggest CAD/ischemic heart disease     Start lopressor 25mg  BID, will get an echo, and labs today, BMET and mag level   Disposition: F/u with test results, otherwise remotes Q 66mo and in clinic in 20mo, sooner if needed.  Current medicines are reviewed at length with the patient today.  The patient did not have any concerns regarding medicines.  Venetia Night, PA-C 11/01/2019 1:04 PM     [address] [place] [address] [phone] (office)  [phone] (fax)

## 2019-11-01 NOTE — Patient Instructions (Addendum)
Increase gabapentin to 3 tabs at bedtime and start one tab in morning.  Call or Send MyChart message if pain in jaw and ear is not improving with this medication change.  The next option  would be stopping gabapentin and trail of carbamazepine.

## 2019-11-01 NOTE — Assessment & Plan Note (Signed)
Multifactorial etiology: meds, age, CAD etc. Trial of viagra.. try lowest effective dose.  Cautioned patient on strenuous physical exertion.[name] stop if SOB or chest pressure/pain.  I asked him to run the viagra by Dr. Curt Bears if a new med is started.

## 2019-11-01 NOTE — Assessment & Plan Note (Signed)
Possible trigeminal/other neuralgia vs atypical post shingles pain.  Tolerating gabapentin.. will try trial of increasing at bedtime to 3000 mg and 100 mg during the day.   If not helping with the pain.Marland Kitchen we can consider a trial of Carbamezapine.

## 2019-11-01 NOTE — Patient Instructions (Signed)
Medication Instructions:   START TAKING METOPROLOL 25 MG TWICE A DAY   *If you need a refill on your cardiac medications before your next appointment, please call your pharmacy*   Lab Work:  BMET  MAG    If you have labs (blood work) drawn today and your tests are completely normal, you will receive your results only by: Marland Kitchen MyChart Message (if you have MyChart) OR . A paper copy in the mail If you have any lab test that is abnormal or we need to change your treatment, we will call you to review the results.   Testing/Procedures: Your physician has requested that you have an echocardiogram. Echocardiography is a painless test that uses sound waves to create images of your heart. It provides your doctor with information about the size and shape of your heart and how well your heart's chambers and valves are working. This procedure takes approximately one hour. There are no restrictions for this procedure.   Follow-Up: At [facility], you and your health needs are our priority.  As part of our continuing mission to provide you with exceptional heart care, we have created designated Provider Care Teams.  These Care Teams include your primary Cardiologist (physician) and Advanced Practice Providers (APPs -  Physician Assistants and Nurse Practitioners) who all work together to provide you with the care you need, when you need it.  We recommend signing up for the patient portal called "MyChart".  Sign up information is provided on this After Visit Summary.  MyChart is used to connect with patients for Virtual Visits (Telemedicine).  Patients are able to view lab/test results, encounter notes, upcoming appointments, etc.  Non-urgent messages can be sent to your provider as well.   To learn more about what you can do with MyChart, go to [email or web address].    Your next appointment:   6 month(s)  The format for your next appointment:   In Person  Provider:   You may see Dr, Curt Bears one  of the following Advanced Practice Providers on your designated Care Team:    Tommye Standard, [place]     Other Instructions

## 2019-11-01 NOTE — Progress Notes (Signed)
Chief Complaint  Patient presents with  . Follow-up    Ear/Jaw Pain    History of Present Illness: HPI    86 year old male presents for left ear/jaw pain.  Today he reports he has not pain. He reports the pain is intermittent and occurring 4 days a week, sometimes waking him up at night.   He thinks there is minimal difference in severity or frequency of pain since starting the gabapentin even at 200 mg at bedtime.  he denies and confusion or sedation from the medication.   He starts the gabapentin has cause his chronic leg pain and cramps to resolve and is happy with the result.  He has issues with ED and request a prescription for viagra. He denies chest pain.  He has used this med in the past with success and no SE except mild flushing.  He has an appt today with EP cardiology and will run the viagra by them if they start a new medication.   This visit occurred during the SARS-CoV-2 public health emergency.  Safety protocols were in place, including screening questions prior to the visit, additional usage of staff PPE, and extensive cleaning of exam room while observing appropriate contact time as indicated for disinfecting solutions.   COVID 19 screen:  No recent travel or known exposure to COVID19 The patient denies respiratory symptoms of COVID 19 at this time. The importance of social distancing was discussed today.     Review of Systems  Constitutional: Negative for chills and fever.  HENT: Negative for congestion and ear pain.   Eyes: Negative for pain and redness.  Respiratory: Negative for cough and shortness of breath.   Cardiovascular: Negative for chest pain, palpitations and leg swelling.  Gastrointestinal: Negative for abdominal pain, blood in stool, constipation, diarrhea, nausea and vomiting.  Genitourinary: Negative for dysuria.  Musculoskeletal: Negative for falls and myalgias.  Skin: Negative for rash.  Neurological: Negative for dizziness.   Psychiatric/Behavioral: Negative for depression. The patient is not nervous/anxious.       Past Medical History:  Diagnosis Date  . Arrhythmia   . Arthritis   . Atrial fibrillation ([place])   . Cancer ([place]) 1972  . ED (erectile dysfunction)   . Glaucoma   . History of chicken pox   . Hypertension   . Melanoma ([place])    left abdomen    reports that he quit smoking about 53 years ago. He has a 10.00 pack-year smoking history. He quit smokeless tobacco use about 53 years ago. He reports previous alcohol use. He reports that he does not use drugs.   Current Outpatient Medications:  .  amLODipine (NORVASC) 10 MG tablet, Take 1 tablet (10 mg total) by mouth daily., Disp: 90 tablet, Rfl: 3 .  apixaban (ELIQUIS) 5 MG TABS tablet, Take 1 tablet (5 mg total) by mouth 2 (two) times daily., Disp: 60 tablet, Rfl: 5 .  Ascorbic Acid (VITA-C PO), Take 1 tablet by mouth daily., Disp: , Rfl:  .  cholecalciferol (VITAMIN D) 1000 UNITS tablet, Take 1,000 Units by mouth daily., Disp: , Rfl:  .  donepezil (ARICEPT) 5 MG tablet, Take 1 tablet (5 mg total) by mouth daily., Disp: 90 tablet, Rfl: 3 .  gabapentin (NEURONTIN) 100 MG capsule, 1 cap at bedtime, if not improving after 1 week, increase to twice daily, Disp: 60 capsule, Rfl: 3 .  lisinopril (ZESTRIL) 20 MG tablet, Take 1 tablet (20 mg total) by mouth daily., Disp: 90 tablet,  Rfl: 3 .  Multiple Vitamins-Minerals (ZINC PO), Take by mouth., Disp: , Rfl:  .  triamcinolone cream (KENALOG) 0.1 %, APPLY TO AFFECTED AREAS UP TO TWO TIMES DAILY AS NEEDED. AVOID FACE AND UNDERARMS., Disp: , Rfl: 1 .  valACYclovir (VALTREX) 1000 MG tablet, Take 1,000 mg by mouth 2 (two) times daily., Disp: , Rfl:  .  sildenafil (VIAGRA) 100 MG tablet, Take 0.5-1 tablets (50-100 mg total) by mouth daily as needed for erectile dysfunction., Disp: 10 tablet, Rfl: 11   Observations/Objective: Blood pressure 140/70, pulse 65, temperature 98.1 F (36.7 C), temperature source  Temporal, height 5' 7.5" (1.715 m), weight 156 lb 4 oz (70.9 kg), SpO2 97 %.  Physical Exam Constitutional:      Appearance: He is well-developed.  HENT:     Head: Normocephalic.     Jaw: No tenderness, pain on movement or malocclusion.     Salivary Glands: Right salivary gland is not diffusely enlarged or tender. Left salivary gland is not diffusely enlarged or tender.     Comments: No curretn tenderness to palpation.. but pt points to area of pain in id mandible on left and deep in ear    Right Ear: Decreased hearing noted. No tenderness. No middle ear effusion. There is no impacted cerumen.     Left Ear: Decreased hearing noted. No tenderness.  No middle ear effusion. There is no impacted cerumen.     Ears:     Comments: No current tenderness in ear canal.    Nose: Nose normal.  Neck:     Thyroid: No thyroid mass or thyromegaly.     Vascular: No carotid bruit.     Trachea: Trachea normal.  Cardiovascular:     Rate and Rhythm: Normal rate and regular rhythm.     Pulses: Normal pulses.     Heart sounds: Heart sounds not distant. No murmur. No friction rub. No gallop.      Comments: No peripheral edema Pulmonary:     Effort: Pulmonary effort is normal. No respiratory distress.     Breath sounds: Normal breath sounds.  Skin:    General: Skin is warm and dry.     Findings: No rash.  Psychiatric:        Speech: Speech normal.        Behavior: Behavior normal.        Thought Content: Thought content normal.      Assessment and Plan Chronic left ear pain Possible trigeminal/other neuralgia vs atypical post shingles pain.  Tolerating gabapentin.. will try trial of increasing at bedtime to 3000 mg and 100 mg during the day.   If not helping with the pain.Marland Kitchen we can consider a trial of Carbamezapine.  Erectile dysfunction Multifactorial etiology: meds, age, CAD etc. Trial of viagra.. try lowest effective dose.  Cautioned patient on strenuous physical exertion.Marland Kitchen stop if SOB or  chest pressure/pain.  I asked him to run the viagra by Dr. Curt Bears if a new med is started.     Eliezer Lofts, MD

## 2019-11-02 LAB — BASIC METABOLIC PANEL
BUN/Creatinine Ratio: 16 (ref 10–24)
BUN: 20 mg/dL (ref 8–27)
CO2: 26 mmol/L (ref 20–29)
Calcium: 9.5 mg/dL (ref 8.6–10.2)
Chloride: 105 mmol/L (ref 96–106)
Creatinine, Ser: 1.25 mg/dL (ref 0.76–1.27)
GFR calc Af Amer: 59 mL/min/{1.73_m2} — ABNORMAL LOW (ref >59)
GFR calc non Af Amer: 51 mL/min/{1.73_m2} — ABNORMAL LOW (ref >59)
Glucose: 91 mg/dL (ref 65–99)
Potassium: 4.6 mmol/L (ref 3.5–5.2)
Sodium: 145 mmol/L — ABNORMAL HIGH (ref 134–144)

## 2019-11-02 LAB — MAGNESIUM: Magnesium: 2.3 mg/dL (ref 1.6–2.3)

## 2019-11-13 ENCOUNTER — Other Ambulatory Visit: Payer: Self-pay

## 2019-11-13 ENCOUNTER — Ambulatory Visit (HOSPITAL_COMMUNITY): Payer: Medicare Other | Attending: Cardiology

## 2019-11-13 DIAGNOSIS — I4729 Other ventricular tachycardia: Secondary | ICD-10-CM

## 2019-11-13 DIAGNOSIS — I472 Ventricular tachycardia: Secondary | ICD-10-CM | POA: Diagnosis not present

## 2019-11-27 ENCOUNTER — Telehealth: Payer: Self-pay | Admitting: Cardiology

## 2019-11-27 DIAGNOSIS — D225 Melanocytic nevi of trunk: Secondary | ICD-10-CM | POA: Diagnosis not present

## 2019-11-27 DIAGNOSIS — Z08 Encounter for follow-up examination after completed treatment for malignant neoplasm: Secondary | ICD-10-CM | POA: Diagnosis not present

## 2019-11-27 DIAGNOSIS — Z1283 Encounter for screening for malignant neoplasm of skin: Secondary | ICD-10-CM | POA: Diagnosis not present

## 2019-11-27 DIAGNOSIS — Z85820 Personal history of malignant melanoma of skin: Secondary | ICD-10-CM | POA: Diagnosis not present

## 2019-11-27 NOTE — Telephone Encounter (Signed)
Yes, he can take the two together.

## 2019-11-27 NOTE — Telephone Encounter (Signed)
Pt aware of response ./cy 

## 2019-11-27 NOTE — Telephone Encounter (Signed)
New message  Pt c/o medication issue:  1. Name of Medication: Elavil 25mg   2. How are you currently taking this medication (dosage and times per day)?n/a  3. Are you having a reaction (difficulty breathing--STAT)?no  4. What is your medication issue? Patient wants to know if he can take this medication with Eliquis. Please call.

## 2019-12-02 DIAGNOSIS — H524 Presbyopia: Secondary | ICD-10-CM | POA: Diagnosis not present

## 2019-12-02 DIAGNOSIS — H40153 Residual stage of open-angle glaucoma, bilateral: Secondary | ICD-10-CM | POA: Diagnosis not present

## 2019-12-31 DIAGNOSIS — Z08 Encounter for follow-up examination after completed treatment for malignant neoplasm: Secondary | ICD-10-CM | POA: Diagnosis not present

## 2019-12-31 DIAGNOSIS — Z85828 Personal history of other malignant neoplasm of skin: Secondary | ICD-10-CM | POA: Diagnosis not present

## 2019-12-31 DIAGNOSIS — B0221 Postherpetic geniculate ganglionitis: Secondary | ICD-10-CM | POA: Diagnosis not present

## 2019-12-31 DIAGNOSIS — L570 Actinic keratosis: Secondary | ICD-10-CM | POA: Diagnosis not present

## 2020-01-07 ENCOUNTER — Ambulatory Visit: Payer: Medicare Other | Admitting: Physician Assistant

## 2020-01-20 ENCOUNTER — Ambulatory Visit (INDEPENDENT_AMBULATORY_CARE_PROVIDER_SITE_OTHER): Payer: Medicare Other | Admitting: *Deleted

## 2020-01-20 DIAGNOSIS — I495 Sick sinus syndrome: Secondary | ICD-10-CM | POA: Diagnosis not present

## 2020-01-23 LAB — CUP PACEART REMOTE DEVICE CHECK
Battery Remaining Longevity: 124 mo
Battery Remaining Percentage: 95.5 %
Battery Voltage: 2.98 V
Brady Statistic AP VP Percent: 81 %
Brady Statistic AP VS Percent: 1 %
Brady Statistic AS VP Percent: 18 %
Brady Statistic AS VS Percent: 1 %
Brady Statistic RA Percent Paced: 80 %
Brady Statistic RV Percent Paced: 99 %
Date Time Interrogation Session: 20210816055807
Implantable Lead Implant Date: 20161003
Implantable Lead Implant Date: 20161003
Implantable Lead Location: 753859
Implantable Lead Location: 753860
Implantable Pulse Generator Implant Date: 20161003
Lead Channel Impedance Value: 410 Ohm
Lead Channel Impedance Value: 460 Ohm
Lead Channel Pacing Threshold Amplitude: 0.5 V
Lead Channel Pacing Threshold Amplitude: 0.75 V
Lead Channel Pacing Threshold Pulse Width: 0.4 ms
Lead Channel Pacing Threshold Pulse Width: 0.4 ms
Lead Channel Sensing Intrinsic Amplitude: 5 mV
Lead Channel Sensing Intrinsic Amplitude: 9.3 mV
Lead Channel Setting Pacing Amplitude: 1 V
Lead Channel Setting Pacing Amplitude: 1.5 V
Lead Channel Setting Pacing Pulse Width: 0.4 ms
Lead Channel Setting Sensing Sensitivity: 2 mV
Pulse Gen Model: 2240
Pulse Gen Serial Number: 7818292

## 2020-01-27 ENCOUNTER — Other Ambulatory Visit: Payer: Self-pay | Admitting: Family Medicine

## 2020-01-29 NOTE — Progress Notes (Signed)
Remote pacemaker transmission.   

## 2020-03-03 DIAGNOSIS — H40003 Preglaucoma, unspecified, bilateral: Secondary | ICD-10-CM | POA: Diagnosis not present

## 2020-03-08 ENCOUNTER — Other Ambulatory Visit: Payer: Self-pay | Admitting: Cardiology

## 2020-03-08 DIAGNOSIS — I441 Atrioventricular block, second degree: Secondary | ICD-10-CM

## 2020-03-09 NOTE — Telephone Encounter (Signed)
Prescription refill request for Eliquis received.  Last office visit: Charlcie Cradle, 11/01/2019 Scr: 1.08, 09/09/2019 Age: 86 y.o.  Weight: 70.8 kg   Prescription refill sent.

## 2020-04-02 ENCOUNTER — Ambulatory Visit: Payer: Medicare Other | Admitting: Family Medicine

## 2020-04-02 ENCOUNTER — Other Ambulatory Visit: Payer: Self-pay

## 2020-04-02 ENCOUNTER — Encounter: Payer: Self-pay | Admitting: Family Medicine

## 2020-04-02 VITALS — BP 130/80 | HR 69 | Temp 96.9°F | Ht 67.5 in | Wt 145.2 lb

## 2020-04-02 DIAGNOSIS — F03A0 Unspecified dementia, mild, without behavioral disturbance, psychotic disturbance, mood disturbance, and anxiety: Secondary | ICD-10-CM

## 2020-04-02 DIAGNOSIS — I10 Essential (primary) hypertension: Secondary | ICD-10-CM | POA: Diagnosis not present

## 2020-04-02 DIAGNOSIS — G8929 Other chronic pain: Secondary | ICD-10-CM

## 2020-04-02 DIAGNOSIS — I480 Paroxysmal atrial fibrillation: Secondary | ICD-10-CM

## 2020-04-02 DIAGNOSIS — E440 Moderate protein-calorie malnutrition: Secondary | ICD-10-CM | POA: Diagnosis not present

## 2020-04-02 DIAGNOSIS — R634 Abnormal weight loss: Secondary | ICD-10-CM | POA: Diagnosis not present

## 2020-04-02 DIAGNOSIS — R6884 Jaw pain: Secondary | ICD-10-CM

## 2020-04-02 DIAGNOSIS — L989 Disorder of the skin and subcutaneous tissue, unspecified: Secondary | ICD-10-CM

## 2020-04-02 DIAGNOSIS — F0390 Unspecified dementia without behavioral disturbance: Secondary | ICD-10-CM

## 2020-04-02 NOTE — Progress Notes (Signed)
History of Present Illness: HPI   86 year old male with mild dementia presents with his son today for a 5-6 month follow up.  Hypertension:  Stable control. BP Readings from Last 3 Encounters:  04/02/20 130/80  11/01/19 132/64  11/01/19 140/70  Using medication without problems or lightheadedness: none Chest pain with exertion:none Edema:none Short of breath:stable Average home BPs: Other issues: paroxysmal afib, av heart block: followed by cardiology.. Dr. Lennie Odor. On Eliquis anticoagulation, AECEI and amlodipine.. rate controlled.  Last OV 11/01/2019 reviewed: Had some noted NSVT ( asymptomatic).Marland Kitchen started on lopressor.  ECHO 11/2019: nml EF  He has lost 10 lbs in last 6 months.  He has decreased appetite... gets full quickly.  Has not taken Wt Readings from Last 3 Encounters:  04/02/20 145 lb 4 oz (65.9 kg)  11/01/19 156 lb (70.8 kg)  11/01/19 156 lb 4 oz (70.9 kg)   He reports that facial swelling in left face is gradaully improving but not gone. Does not interfere with eating. Chews on right side. Has some loss of taste with past COVID.Marland Kitchen no longer likes Coke and coffee. He dos not feel depressed.. trys to stay active.  He does feel tired. Using occ aleve off/on.. 2 times a week to sleep at night given face pain. Tylenol did not help.  Gabapentin 300 mg did not help face much but helped leg cramps.  This visit occurred during the SARS-CoV-2 public health emergency.  Safety protocols were in place, including screening questions prior to the visit, additional usage of staff PPE, and extensive cleaning of exam room while observing appropriate contact time as indicated for disinfecting solutions.   COVID 19 screen:  No recent travel or known exposure to COVID19 The patient denies respiratory symptoms of COVID 19 at this time. The importance of social distancing was discussed today.     Review of Systems  Constitutional: Negative for chills and fever.  HENT: Negative  for congestion and ear pain.   Eyes: Negative for pain and redness.  Respiratory: Negative for cough and shortness of breath.   Cardiovascular: Negative for chest pain, palpitations and leg swelling.  Gastrointestinal: Negative for abdominal pain, blood in stool, constipation, diarrhea, nausea and vomiting.  Genitourinary: Negative for dysuria.  Musculoskeletal: Negative for falls and myalgias.  Skin: Negative for rash.  Neurological: Negative for dizziness.  Psychiatric/Behavioral: Negative for depression. The patient is not nervous/anxious.       Past Medical History:  Diagnosis Date  . Arrhythmia   . Arthritis   . Atrial fibrillation ([place])   . Cancer ([place]) 1972  . ED (erectile dysfunction)   . Glaucoma   . History of chicken pox   . Hypertension   . Melanoma ([place])    left abdomen    reports that he quit smoking about 53 years ago. He has a 10.00 pack-year smoking history. He quit smokeless tobacco use about 53 years ago. He reports previous alcohol use. He reports that he does not use drugs.   Current Outpatient Medications:  .  amLODipine (NORVASC) 10 MG tablet, Take 1 tablet (10 mg total) by mouth daily., Disp: 90 tablet, Rfl: 3 .  Ascorbic Acid (VITA-C PO), Take 1 tablet by mouth daily., Disp: , Rfl:  .  cholecalciferol (VITAMIN D) 1000 UNITS tablet, Take 1,000 Units by mouth daily., Disp: , Rfl:  .  donepezil (ARICEPT) 5 MG tablet, Take 1 tablet (5 mg total) by mouth daily., Disp: 90 tablet, Rfl: 3 .  ELIQUIS  5 MG TABS tablet, Take 1 tablet by mouth twice daily, Disp: 60 tablet, Rfl: 5 .  lisinopril (ZESTRIL) 20 MG tablet, Take 1 tablet (20 mg total) by mouth daily., Disp: 90 tablet, Rfl: 3 .  Multiple Vitamins-Minerals (ZINC PO), Take by mouth., Disp: , Rfl:  .  sildenafil (VIAGRA) 100 MG tablet, Take 0.5-1 tablets (50-100 mg total) by mouth daily as needed for erectile dysfunction., Disp: 10 tablet, Rfl: 11 .  triamcinolone cream (KENALOG) 0.1 %, APPLY TO AFFECTED AREAS  UP TO TWO TIMES DAILY AS NEEDED. AVOID FACE AND UNDERARMS., Disp: , Rfl: 1 .  Zinc Sulfate (ZINC 15 PO), Take by mouth daily., Disp: , Rfl:  .  metoprolol tartrate (LOPRESSOR) 25 MG tablet, Take 1 tablet (25 mg total) by mouth 2 (two) times daily., Disp: 180 tablet, Rfl: 2   Observations/Objective: Blood pressure 130/80, pulse 69, temperature (!) 96.9 F (36.1 C), temperature source Temporal, height 5' 7.5" (1.715 m), weight 145 lb 4 oz (65.9 kg), SpO2 99 %.  Physical Exam Skin:    Comments: Dry patch on upper lip   irregualr erythematous scaly lesion approx 2 cm x 1 cm left face      Assessment and Plan   Essential hypertension Well controlled. Continue current medication.   Paroxysmal atrial fibrillation (HCC) On eliquis... in sinus today.  Mild dementia (HCC) Stable minimal change.  Moderate protein-calorie malnutrition (HCC) Encouraged po intake including  Protein shake supplementation and a non restricted diet.  Abnormal weight loss Denies depression.  Moderate appettite.. not interested in remoron or other appetite stimulant at this time. In past may be due to chewing issues on left... not much daytime left face pain.  Chronic jaw pain In my opinion likely trigeminal neuralgia.Marland Kitchen less likely post herpetic neuralgia. Seems to be improving and pt cleared  By cardiology to use aleve sparingly for pain... this seems to help well.  Discussed trial of lyrica pr even carbamazepine... will hold off at this time unless pain worsening.   Skin lesion of face He was told in last it could be shingles, but it has been present for month.. looks more like squamous cell ca to me... refer back to dermatology for biopsy. Has hx of melanoma     Eliezer Lofts, MD

## 2020-04-02 NOTE — Assessment & Plan Note (Signed)
Encouraged po intake including  Protein shake supplementation and a non restricted diet.

## 2020-04-02 NOTE — Assessment & Plan Note (Signed)
On eliquis... in sinus today.

## 2020-04-02 NOTE — Assessment & Plan Note (Signed)
Denies depression.  Moderate appettite.. not interested in remoron or other appetite stimulant at this time. In past may be due to chewing issues on left... not much daytime left face pain.

## 2020-04-02 NOTE — Patient Instructions (Addendum)
Add Ensure or Boost in addition to one  To two meal each day. Try not to skip meals. Call if interested in a trial on Lyrica  at night for face pain. Call Dermatologist... follow up on the skin are on left cheek... to make sure it is not early skin cancer.

## 2020-04-02 NOTE — Assessment & Plan Note (Signed)
Well controlled. Continue current medication.  

## 2020-04-02 NOTE — Assessment & Plan Note (Signed)
In my opinion likely trigeminal neuralgia.Marland Kitchen less likely post herpetic neuralgia. Seems to be improving and pt cleared  By cardiology to use aleve sparingly for pain... this seems to help well.  Discussed trial of lyrica pr even carbamazepine... will hold off at this time unless pain worsening.

## 2020-04-02 NOTE — Assessment & Plan Note (Signed)
He was told in last it could be shingles, but it has been present for month.. looks more like squamous cell ca to me... refer back to dermatology for biopsy. Has hx of melanoma

## 2020-04-02 NOTE — Assessment & Plan Note (Signed)
Stable minimal change.

## 2020-04-07 DIAGNOSIS — L570 Actinic keratosis: Secondary | ICD-10-CM | POA: Diagnosis not present

## 2020-04-07 DIAGNOSIS — Z85828 Personal history of other malignant neoplasm of skin: Secondary | ICD-10-CM | POA: Diagnosis not present

## 2020-04-07 DIAGNOSIS — Z08 Encounter for follow-up examination after completed treatment for malignant neoplasm: Secondary | ICD-10-CM | POA: Diagnosis not present

## 2020-04-07 DIAGNOSIS — D0439 Carcinoma in situ of skin of other parts of face: Secondary | ICD-10-CM | POA: Diagnosis not present

## 2020-04-20 ENCOUNTER — Ambulatory Visit (INDEPENDENT_AMBULATORY_CARE_PROVIDER_SITE_OTHER): Payer: Medicare Other

## 2020-04-20 DIAGNOSIS — I495 Sick sinus syndrome: Secondary | ICD-10-CM | POA: Diagnosis not present

## 2020-04-20 LAB — CUP PACEART REMOTE DEVICE CHECK
Battery Remaining Longevity: 123 mo
Battery Remaining Percentage: 95.5 %
Battery Voltage: 2.98 V
Brady Statistic AP VP Percent: 83 %
Brady Statistic AP VS Percent: 1 %
Brady Statistic AS VP Percent: 17 %
Brady Statistic AS VS Percent: 1 %
Brady Statistic RA Percent Paced: 82 %
Brady Statistic RV Percent Paced: 99 %
Date Time Interrogation Session: 20211115033834
Implantable Lead Implant Date: 20161003
Implantable Lead Implant Date: 20161003
Implantable Lead Location: 753859
Implantable Lead Location: 753860
Implantable Pulse Generator Implant Date: 20161003
Lead Channel Impedance Value: 410 Ohm
Lead Channel Impedance Value: 430 Ohm
Lead Channel Pacing Threshold Amplitude: 0.5 V
Lead Channel Pacing Threshold Amplitude: 0.75 V
Lead Channel Pacing Threshold Pulse Width: 0.4 ms
Lead Channel Pacing Threshold Pulse Width: 0.4 ms
Lead Channel Sensing Intrinsic Amplitude: 5 mV
Lead Channel Sensing Intrinsic Amplitude: 9.2 mV
Lead Channel Setting Pacing Amplitude: 1 V
Lead Channel Setting Pacing Amplitude: 1.5 V
Lead Channel Setting Pacing Pulse Width: 0.4 ms
Lead Channel Setting Sensing Sensitivity: 2 mV
Pulse Gen Model: 2240
Pulse Gen Serial Number: 7818292

## 2020-04-21 ENCOUNTER — Other Ambulatory Visit: Payer: Self-pay

## 2020-04-21 ENCOUNTER — Encounter: Payer: Self-pay | Admitting: Cardiology

## 2020-04-21 ENCOUNTER — Ambulatory Visit: Payer: Medicare Other | Admitting: Cardiology

## 2020-04-21 VITALS — BP 100/62 | HR 63 | Ht 68.0 in | Wt 145.0 lb

## 2020-04-21 DIAGNOSIS — I4430 Unspecified atrioventricular block: Secondary | ICD-10-CM

## 2020-04-21 DIAGNOSIS — I480 Paroxysmal atrial fibrillation: Secondary | ICD-10-CM | POA: Diagnosis not present

## 2020-04-21 DIAGNOSIS — I441 Atrioventricular block, second degree: Secondary | ICD-10-CM | POA: Diagnosis not present

## 2020-04-21 NOTE — Progress Notes (Signed)
Electrophysiology Office Note   Date:  04/21/2020   ID:  [name], DOB 1929/03/16, MRN [id number]  PCP:  Jinny Sanders, MD  Primary Electrophysiologist:  Constance Haw, MD    No chief complaint on file.    History of Present Illness: [name] is a 86 y.o. male who presents today for electrophysiology evaluation.   He initially presented September 2016 with fatigue and was found to be in 2-1 AV block.  He is now status post [facility] pacemaker implanted October 2016.   Today, denies symptoms of palpitations, chest pain, shortness of breath, orthopnea, PND, lower extremity edema, claudication, dizziness, presyncope, syncope, bleeding, or neurologic sequela. The patient is tolerating medications without difficulties.  Since last being seen he has no major cardiac complaints.  He has no chest pain or shortness of breath.  He is able do all of his daily activities without restriction.  Unfortunately, he was diagnosed with shingles over the left side of his face.  He was diagnosed late and is continuing to have issues of numbness and tingling.  Past Medical History:  Diagnosis Date  . Arrhythmia   . Arthritis   . Atrial fibrillation ([place])   . Cancer ([place]) 1972  . ED (erectile dysfunction)   . Glaucoma   . History of chicken pox   . Hypertension   . Melanoma ([place])    left abdomen   Past Surgical History:  Procedure Laterality Date  . EP IMPLANTABLE DEVICE N/A 03/09/2015   Procedure: Pacemaker Implant;  Surgeon: Raelyn Racette Meredith Leeds, MD;  Location: [facility];  Service: Cardiovascular;  Laterality: N/A;  . NASAL SINUS SURGERY       Current Outpatient Medications  Medication Sig Dispense Refill  . amLODipine (NORVASC) 10 MG tablet Take 1 tablet (10 mg total) by mouth daily. 90 tablet 3  . Ascorbic Acid (VITA-C PO) Take 1 tablet by mouth daily.    . cholecalciferol (VITAMIN D) 1000 UNITS tablet Take 1,000 Units by mouth daily.    Marland Kitchen donepezil (ARICEPT) 5  MG tablet Take 1 tablet (5 mg total) by mouth daily. 90 tablet 3  . ELIQUIS 5 MG TABS tablet Take 1 tablet by mouth twice daily 60 tablet 5  . lisinopril (ZESTRIL) 20 MG tablet Take 1 tablet (20 mg total) by mouth daily. 90 tablet 3  . Multiple Vitamins-Minerals (ZINC PO) Take by mouth.    . sildenafil (VIAGRA) 100 MG tablet Take 0.5-1 tablets (50-100 mg total) by mouth daily as needed for erectile dysfunction. 10 tablet 11  . triamcinolone cream (KENALOG) 0.1 % APPLY TO AFFECTED AREAS UP TO TWO TIMES DAILY AS NEEDED. AVOID FACE AND UNDERARMS.  1  . Zinc Sulfate (ZINC 15 PO) Take by mouth daily.    . metoprolol tartrate (LOPRESSOR) 25 MG tablet Take 1 tablet (25 mg total) by mouth 2 (two) times daily. 180 tablet 2   No current facility-administered medications for this visit.    Allergies:   Avelox [moxifloxacin hcl in nacl], Moxifloxacin, and Sulfa antibiotics   Social History:  The patient  reports that he quit smoking about 53 years ago. He has a 10.00 pack-year smoking history. He quit smokeless tobacco use about 53 years ago. He reports previous alcohol use. He reports that he does not use drugs.   Family History:  The patient's family history includes Asthma in his sister; Heart disease in his mother; Heart failure in his mother; Hypertension in his father, mother, sister,  and sister; Learning disabilities in his sister.   ROS:  Please see the history of present illness.   Otherwise, review of systems is positive for none.   All other systems are reviewed and negative.   PHYSICAL EXAM: VS:  BP 100/62   Pulse 63   Ht 5\' 8"  (1.727 m)   Wt 145 lb (65.8 kg)   SpO2 99%   BMI 22.05 kg/m  , BMI Body mass index is 22.05 kg/m. GEN: Well nourished, well developed, in no acute distress  HEENT: normal  Neck: no JVD, carotid bruits, or masses Cardiac: RRR; no murmurs, rubs, or gallops,no edema  Respiratory:  clear to auscultation bilaterally, normal work of breathing GI: soft, nontender,  nondistended, + BS MS: no deformity or atrophy  Skin: warm and dry, device site well healed Neuro:  Strength and sensation are intact Psych: euthymic mood, full affect  EKG:  EKG is ordered today. Personal review of the ekg ordered shows A sense, V paced  Personal review of the device interrogation today. Results in [place]: 09/09/2019: ALT 12; Hemoglobin 14.6; Platelets 187.0 11/01/2019: BUN 20; Creatinine, Ser 1.25; Magnesium 2.3; Potassium 4.6; Sodium 145    Lipid Panel  No results found for: CHOL, TRIG, HDL, CHOLHDL, VLDL, LDLCALC, LDLDIRECT   Wt Readings from Last 3 Encounters:  04/21/20 145 lb (65.8 kg)  04/02/20 145 lb 4 oz (65.9 kg)  11/01/19 156 lb (70.8 kg)   ASSESSMENT AND PLAN:  1. 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 Device functioning appropriately.  No changes  2. Hypertension:well controlled  3. Paroxysmal atrial fibrillation: Currently on Eliquis. CHA2DS2-VASc of 3.  Minimal on device interrogation   Current medicines are reviewed at length with the patient today.   The patient does not have concerns regarding his medicines.  The following changes were made today: None  Labs/ tests ordered today include:  Orders Placed This Encounter  Procedures  . EKG 12-Lead     Disposition:   FU with Jahmal Dunavant 12 months  Signed, Daelen Belvedere Meredith Leeds, MD  04/21/2020 3:08 PM     [facility] [address] [place] [address] [phone] (office) [phone] (fax)

## 2020-04-21 NOTE — Patient Instructions (Signed)
Medication Instructions:  Your physician recommends that you continue on your current medications as directed. Please refer to the Current Medication list given to you today.  *If you need a refill on your cardiac medications before your next appointment, please call your pharmacy*   Lab Work: None ordered   Testing/Procedures: None ordered   Follow-Up: At [facility], you and your health needs are our priority.  As part of our continuing mission to provide you with exceptional heart care, we have created designated Provider Care Teams.  These Care Teams include your primary Cardiologist (physician) and Advanced Practice Providers (APPs -  Physician Assistants and Nurse Practitioners) who all work together to provide you with the care you need, when you need it.  Remote monitoring is used to monitor your Pacemaker or ICD from home. This monitoring reduces the number of office visits required to check your device to one time per year. It allows [place] to keep an eye on the functioning of your device to ensure it is working properly. You are scheduled for a device check from home on 07/20/20. You may send your transmission at any time that day. If you have a wireless device, the transmission will be sent automatically. After your physician reviews your transmission, you will receive a postcard with your next transmission date.  Your next appointment:   1 year(s)  The format for your next appointment:   In Person  Provider:   Allegra Lai, MD   Thank you for choosing [facility]!!   Trinidad Curet, RN [phone]

## 2020-04-22 NOTE — Progress Notes (Signed)
Remote pacemaker transmission.   

## 2020-07-06 ENCOUNTER — Telehealth: Payer: Self-pay

## 2020-07-06 NOTE — Telephone Encounter (Signed)
I spoke with pt; pt already has appt with Dr Diona Browner on 07/07/20 at 10:40. Last couple of weeks cannot rest at night due to feels like tingling and burning in face; pt said dx with shingles last year; no new vision changes. No new rash; pt does not want to go to [facility] or ED today and will keep appt with Dr Diona Browner on 07/07/20.UC & ED precautions given and pt voiced  Understanding. Sending note to Dr Diona Browner and Butch Penny CMA.

## 2020-07-06 NOTE — Telephone Encounter (Signed)
[facility] - Client TELEPHONE ADVICE RECORD [facility] Patient Name: [name] Gender: Male DOB: January 07, 1929 Age: 87 Y 3 M 16 D Return Phone Number: [phone] (Primary) Address: City/State/Zip: [place] [address] Client [facility] - Client Client Site [place] - Day Physician Eliezer Lofts - MD Contact Type Call Who Is Calling Patient / Member / Family / Caregiver Call Type Triage / Clinical Relationship To Patient Self Return Phone Number [phone] (Primary) Chief Complaint Numbness Reason for Call Symptomatic / Request for [place] states the left side of his face is numb, cant eat or sleep. Caller states it has been going on for over a year but has gotten significantly worse over the past couple of days. Translation No Nurse Assessment Nurse: Sumner Boast, RN, Enid Derry Date/Time Eilene Ghazi Time): 07/06/2020 10:09:05 AM Confirm and document reason for call. If symptomatic, describe symptoms. ---Caller states the left side of his face is numb. States it has been going on for over a year but has gotten significantly worse the past couple of days. He can eat OK. States his face is throbbing. Pain is 8-9 out of 10 pain scale. Has tried Tylenol and Aleve but does not help. No fever. Does the patient have any new or worsening symptoms? ---Yes Will a triage be completed? ---Yes Related visit to physician within the last 2 weeks? ---No Does the PT have any chronic conditions? (i.e. diabetes, asthma, this includes High risk factors for pregnancy, etc.) ---Yes List chronic conditions. ---Pacemaker A Fib Hypertension Is this a behavioral health or substance abuse call? ---No Guidelines Guideline Title Affirmed Question Affirmed Notes Nurse Date/Time (Eastern Time) Neurologic Deficit [1] Numbness (i.e., loss of sensation) of the face, arm / hand, or leg / foot on one  side of the body AND [2] sudden onset AND [3] present now Fil[place], Government social research officer 07/06/2020 10:14:09 AM Disp. Time Eilene Ghazi Time) Disposition Final User 07/06/2020 10:19:09 AM 911 Outcome Documentation Hoskins, RN, Enid Derry PLEASE NOTE: All timestamps contained within this report are represented as [place] Standard Time. CONFIDENTIALTY NOTICE: This fax transmission is intended only for the addressee. It contains information that is legally privileged, confidential or otherwise protected from use or disclosure. If you are not the intended recipient, you are strictly prohibited from reviewing, disclosing, copying using or disseminating any of this information or taking any action in reliance on or regarding this information. If you have received this fax in error, please notify us immediately by telephone so that we can arrange for its return to [place]. Phone: [phone], Toll-Free: [phone], Fax: [phone] Page: 2 of 2 Call Id: [id number] [place]. Time (Eastern Time) Disposition Final User Reason: Caller does not want to call 911 or go to the ED because he has had this problem for a year. 07/06/2020 10:18:40 AM Call EMS 911 Now Yes Sumner Boast, RN, Teola Bradley Disagree/Comply Disagree Caller Understands Yes PreDisposition Call Doctor Care Advice Given Per Guideline CALL EMS 911 NOW: CARE ADVICE given per Neurologic Deficit (Adult) guideline. Comments User: Lorra Hals, RN Date/Time Eilene Ghazi Time): 07/06/2020 10:18:05 AM Caller does not want to call 911 or go to the ED because he has had this problem for a year. Referrals GO TO FACILITY REFUSED

## 2020-07-06 NOTE — Telephone Encounter (Signed)
Noted  

## 2020-07-07 ENCOUNTER — Encounter: Payer: Self-pay | Admitting: Family Medicine

## 2020-07-07 ENCOUNTER — Other Ambulatory Visit: Payer: Self-pay

## 2020-07-07 ENCOUNTER — Ambulatory Visit: Payer: Medicare Other | Admitting: Family Medicine

## 2020-07-07 VITALS — BP 114/74 | HR 60 | Temp 97.8°F | Ht 67.5 in | Wt 140.5 lb

## 2020-07-07 DIAGNOSIS — G500 Trigeminal neuralgia: Secondary | ICD-10-CM | POA: Diagnosis not present

## 2020-07-07 DIAGNOSIS — R6884 Jaw pain: Secondary | ICD-10-CM | POA: Diagnosis not present

## 2020-07-07 DIAGNOSIS — H9202 Otalgia, left ear: Secondary | ICD-10-CM

## 2020-07-07 DIAGNOSIS — G8929 Other chronic pain: Secondary | ICD-10-CM

## 2020-07-07 MED ORDER — PREGABALIN 75 MG PO CAPS
75.0000 mg | ORAL_CAPSULE | Freq: Two times a day (BID) | ORAL | 1 refills | Status: DC
Start: 2020-07-07 — End: 2020-07-16

## 2020-07-07 NOTE — Patient Instructions (Signed)
We will call you to set up a referral to a neurologist.  Can try trial of Lyrica twice daily given carbamazepine can interact with your Eliquis.

## 2020-07-07 NOTE — Progress Notes (Signed)
Patient ID: [name], male    DOB: Oct 17, 1928, 87 y.o.   MRN: [id number]  This visit was conducted in person.  BP 114/74    Pulse 60    Temp 97.8 F (36.6 C) (Temporal)    Ht 5' 7.5" (1.715 m)    Wt 140 lb 8 oz (63.7 kg)    SpO2 96%    BMI 21.68 kg/m    CC:  Chief Complaint  Patient presents with   Facial Numbness    Left Side   Otalgia    Subjective:   HPI: [name] is a 87 y.o. male presenting on 07/07/2020 for Facial Numbness (Left Side) and Otalgia  He has istory of shingles and chronic left ear pain, left facial/jaw pain that had been improving  Until the last few weeks.  I had felt possible trigeminal neuralgia.Marland Kitchen less likely post herpetic neuralgia. Tried gabapentin in past  100 in day / 300 mg at bedtime.   Pain keeping him up at night with pain. Pain with opening his mouth.  eels like pins on his cheek bones.   Tyelnol and aleve are not helping.  Gabapentin did not help much.  Has seen ENT... normal ear.  Dentist cleared him as well.  Derm: Told in past shingles but also removed skin cancer.    Relevant past medical, surgical, family and social history reviewed and updated as indicated. Interim medical history since our last visit reviewed. Allergies and medications reviewed and updated. Outpatient Medications Prior to Visit  Medication Sig Dispense Refill   amLODipine (NORVASC) 10 MG tablet Take 1 tablet (10 mg total) by mouth daily. 90 tablet 3   Ascorbic Acid (VITA-C PO) Take 1 tablet by mouth daily.     cholecalciferol (VITAMIN D) 1000 UNITS tablet Take 1,000 Units by mouth daily.     donepezil (ARICEPT) 5 MG tablet Take 1 tablet (5 mg total) by mouth daily. 90 tablet 3   ELIQUIS 5 MG TABS tablet Take 1 tablet by mouth twice daily 60 tablet 5   lisinopril (ZESTRIL) 20 MG tablet Take 1 tablet (20 mg total) by mouth daily. 90 tablet 3   Multiple Vitamins-Minerals (ZINC PO) Take by mouth.     sildenafil (VIAGRA) 100 MG tablet Take 0.5-1  tablets (50-100 mg total) by mouth daily as needed for erectile dysfunction. 10 tablet 11   triamcinolone cream (KENALOG) 0.1 % APPLY TO AFFECTED AREAS UP TO TWO TIMES DAILY AS NEEDED. AVOID FACE AND UNDERARMS.  1   Zinc Sulfate (ZINC 15 PO) Take by mouth daily.     metoprolol tartrate (LOPRESSOR) 25 MG tablet Take 1 tablet (25 mg total) by mouth 2 (two) times daily. 180 tablet 2   No facility-administered medications prior to visit.     Per HPI unless specifically indicated in ROS section below Review of Systems  Constitutional: Negative for fatigue and fever.  HENT: Positive for ear pain.   Eyes: Negative for pain.  Respiratory: Negative for cough and shortness of breath.   Cardiovascular: Negative for chest pain, palpitations and leg swelling.  Gastrointestinal: Negative for abdominal pain.  Genitourinary: Negative for dysuria.  Musculoskeletal: Negative for arthralgias.  Neurological: Negative for syncope, light-headedness and headaches.  Psychiatric/Behavioral: Negative for dysphoric mood.   Objective:  BP 114/74    Pulse 60    Temp 97.8 F (36.6 C) (Temporal)    Ht 5' 7.5" (1.715 m)    Wt 140 lb 8 oz (63.7 kg)  SpO2 96%    BMI 21.68 kg/m   Wt Readings from Last 3 Encounters:  07/07/20 140 lb 8 oz (63.7 kg)  04/21/20 145 lb (65.8 kg)  04/02/20 145 lb 4 oz (65.9 kg)      Physical Exam Constitutional:      Appearance: He is well-developed.  HENT:     Head: Normocephalic.     Right Ear: Hearing normal.     Left Ear: Hearing normal.     Nose: Nose normal.  Neck:     Thyroid: No thyroid mass or thyromegaly.     Vascular: No carotid bruit.     Trachea: Trachea normal.  Cardiovascular:     Rate and Rhythm: Normal rate and regular rhythm.     Pulses: Normal pulses.     Heart sounds: Heart sounds not distant. No murmur heard. No friction rub. No gallop.      Comments: No peripheral edema Pulmonary:     Effort: Pulmonary effort is normal. No respiratory distress.      Breath sounds: Normal breath sounds.  Skin:    General: Skin is warm and dry.     Findings: No rash.  Psychiatric:        Speech: Speech normal.        Behavior: Behavior normal.        Thought Content: Thought content normal.       Results for orders placed or performed in visit on 04/20/20  CUP PACEART REMOTE DEVICE CHECK  Result Value Ref Range   Date Time Interrogation Session [id number]    Pulse Generator Manufacturer [employer]    Pulse Gen Model 2240 Assurity    Pulse Gen Serial Number [id number]    Clinic Name [facility] Pulse Generator Type Implantable Pulse Generator    Implantable Pulse Generator Implant Date [id number]    Implantable Lead Manufacturer [facility]    Implantable Lead Model [id number] Tendril STS    Implantable Lead Serial Number [id number]    Implantable Lead Implant Date 73710626    Implantable Lead Location Detail 1 UNKNOWN    Implantable Lead Location [id number]    Implantable Lead Manufacturer [facility]    Implantable Lead Model [phone] Tendril STS    Implantable Lead Serial Number [id number]    Implantable Lead Implant Date 27035009    Implantable Lead Location Detail 1 APEX    Implantable Lead Location [id number]    Lead Channel Setting Sensing Sensitivity 2.0 mV   Lead Channel Setting Sensing Adaptation Mode Fixed Pacing    Lead Channel Setting Pacing Amplitude 1.5 V   Lead Channel Setting Pacing Pulse Width 0.4 ms   Lead Channel Setting Pacing Amplitude 1.0 V   Lead Channel Status     Lead Channel Impedance Value 430 ohm   Lead Channel Sensing Intrinsic Amplitude 5.0 mV   Lead Channel Pacing Threshold Amplitude 0.5 V   Lead Channel Pacing Threshold Pulse Width 0.4 ms   Lead Channel Status     Lead Channel Impedance Value 410 ohm   Lead Channel Sensing Intrinsic Amplitude 9.2 mV   Lead Channel Pacing Threshold Amplitude 0.75 V   Lead Channel Pacing Threshold Pulse Width 0.4 ms   Battery Status MOS    Battery Remaining Longevity 123 mo    Battery Remaining Percentage 95.5 %   Battery Voltage 2.98 V   Brady Statistic RA Percent Paced 82.0 %   Brady Statistic RV Percent Paced 99.0 %   [employer] AP  VP Percent 83.0 %   Brady Statistic AS VP Percent 17.0 %   Brady Statistic AP VS Percent 1.0 %   Brady Statistic AS VS Percent 1.0 %    This visit occurred during the SARS-CoV-2 public health emergency.  Safety protocols were in place, including screening questions prior to the visit, additional usage of staff PPE, and extensive cleaning of exam room while observing appropriate contact time as indicated for disinfecting solutions.   COVID 19 screen:  No recent travel or known exposure to COVID19 The patient denies respiratory symptoms of COVID 19 at this time. The importance of social distancing was discussed today.   Assessment and Plan    Chronic left ear and jaw pain, possibly due to trigeminal neuralgia.  Carbamazepine contraindicated with his other medications.  Gabapentin not helpful.  will try trial of lyrica and refer to neuro for further recommendations.  Eliezer Lofts, MD

## 2020-07-09 ENCOUNTER — Encounter: Payer: Self-pay | Admitting: Neurology

## 2020-07-16 MED ORDER — AMITRIPTYLINE HCL 10 MG PO TABS: 10.0000 mg | ORAL_TABLET | Freq: Every day | ORAL | 3 refills | Status: DC

## 2020-07-17 ENCOUNTER — Other Ambulatory Visit: Payer: Self-pay | Admitting: Physician Assistant

## 2020-07-20 ENCOUNTER — Ambulatory Visit (INDEPENDENT_AMBULATORY_CARE_PROVIDER_SITE_OTHER): Payer: Medicare Other

## 2020-07-20 DIAGNOSIS — I4430 Unspecified atrioventricular block: Secondary | ICD-10-CM

## 2020-07-20 LAB — CUP PACEART REMOTE DEVICE CHECK
Battery Remaining Longevity: 117 mo
Battery Remaining Percentage: 95.5 %
Battery Voltage: 2.96 V
Brady Statistic AP VP Percent: 77 %
Brady Statistic AP VS Percent: 1 %
Brady Statistic AS VP Percent: 21 %
Brady Statistic AS VS Percent: 1 %
Brady Statistic RA Percent Paced: 76 %
Brady Statistic RV Percent Paced: 98 %
Date Time Interrogation Session: 20220214020014
Implantable Lead Implant Date: 20161003
Implantable Lead Implant Date: 20161003
Implantable Lead Location: 753859
Implantable Lead Location: 753860
Implantable Pulse Generator Implant Date: 20161003
Lead Channel Impedance Value: 410 Ohm
Lead Channel Impedance Value: 430 Ohm
Lead Channel Pacing Threshold Amplitude: 0.5 V
Lead Channel Pacing Threshold Amplitude: 0.5 V
Lead Channel Pacing Threshold Pulse Width: 0.4 ms
Lead Channel Pacing Threshold Pulse Width: 0.4 ms
Lead Channel Sensing Intrinsic Amplitude: 5 mV
Lead Channel Sensing Intrinsic Amplitude: 9.4 mV
Lead Channel Setting Pacing Amplitude: 0.75 V
Lead Channel Setting Pacing Amplitude: 1.5 V
Lead Channel Setting Pacing Pulse Width: 0.4 ms
Lead Channel Setting Sensing Sensitivity: 2 mV
Pulse Gen Model: 2240
Pulse Gen Serial Number: 7818292

## 2020-07-27 NOTE — Progress Notes (Signed)
Remote pacemaker transmission.   

## 2020-08-03 DIAGNOSIS — H40153 Residual stage of open-angle glaucoma, bilateral: Secondary | ICD-10-CM | POA: Diagnosis not present

## 2020-08-23 ENCOUNTER — Other Ambulatory Visit: Payer: Self-pay | Admitting: Family Medicine

## 2020-08-27 ENCOUNTER — Telehealth: Payer: Self-pay | Admitting: Family Medicine

## 2020-08-27 DIAGNOSIS — I10 Essential (primary) hypertension: Secondary | ICD-10-CM

## 2020-08-27 DIAGNOSIS — E440 Moderate protein-calorie malnutrition: Secondary | ICD-10-CM

## 2020-08-27 NOTE — Telephone Encounter (Signed)
-----   Message from Ellamae Sia sent at 08/24/2020 11:33 AM EDT ----- Regarding: Lab orders for Friday, 4.8.22  AWV lab orders, please.

## 2020-09-08 NOTE — Progress Notes (Signed)
NEUROLOGY CONSULTATION NOTE  [name] MRN: [id number] DOB: [birth year]  Referring provider: Eliezer Lofts, MD Primary care provider: Eliezer Lofts, MD  Reason for consult:  Chronic left ear pain and facial numbness  Assessment/Plan:   Left-sided post-herpetic trigeminal neuralgia.  History consistent.  1.  Would switch from amitriptyline to nortriptyline, as it tends to be better tolerated, and titrate to 20mg  at bedtime.  If no improvement in 4-5 weeks, we can titrate dose.   2.  May treat with Tylenol and Aleve for breakthrough pain. 3.  Follow up 4 to 6 months.   Subjective:  [name] is an 87 year old right-handed male with paroxysmal atrial fibrillation on Eliquis, HTN, heart block and history of melanoma who presents for chronic left sided otalgia and facial numbness.  History supplemented by referring provider's note.  He is accompanied by his son who also supplements history.  He reports left sided facial pain beginning in January 2021.  Aching, burning.  Involving Very painful.  Causes difficulty opening his mouth and eating.  He has seen both ENT and the dentist with normal exams.  Maxillofacial CT on 09/24/2019 personally reviewed and showed postoperative and patent right [facility] and mild chronic maxillary sinusitis with no pathology of the TMJ or other acute abnormalities.  He did have a rash involving the left V1-V2 region.  It was observed by a dermatologist who diagnosed shingles but it was too late to acutely treat it.  Tylenol and Aleve helps somewhat.  However, he has not found a medication to adequately control it.  Gabapentin was ineffective.  He had side effects to Lyrica.  Ointments ineffective.  He is unable to take carbamazepine due to potentially reducing efficacy of Eliquis.  He was started on amitriptyline a month ago with no improvement.  He has history of squamous cell carcinoma of the lip.  Remote history of melanoma decades ago on his abdomen.  EKG from  November reviewed.   Current medications:  Amitriptyline 10mg  at bedtime Past medications:  Gabapentin 100mg  AM and 300mg  PM (ineffective), Lyrica (caused balance problems).  Carbamazepine not tried due to possibly reducing efficacy of Eliqus.   PAST MEDICAL HISTORY: Past Medical History:  Diagnosis Date  . Arrhythmia   . Arthritis   . Atrial fibrillation ([place])   . Cancer ([place]) 1972  . ED (erectile dysfunction)   . Glaucoma   . History of chicken pox   . Hypertension   . Melanoma ([place])    left abdomen    PAST SURGICAL HISTORY: Past Surgical History:  Procedure Laterality Date  . EP IMPLANTABLE DEVICE N/A 03/09/2015   Procedure: Pacemaker Implant;  Surgeon: Will Meredith Leeds, MD;  Location: [facility];  Service: Cardiovascular;  Laterality: N/A;  . NASAL SINUS SURGERY      MEDICATIONS: Current Outpatient Medications on File Prior to Visit  Medication Sig Dispense Refill  . amitriptyline (ELAVIL) 10 MG tablet Take 1 tablet (10 mg total) by mouth at bedtime. 30 tablet 3  . amLODipine (NORVASC) 10 MG tablet Take 1 tablet (10 mg total) by mouth daily. 90 tablet 3  . Ascorbic Acid (VITA-C PO) Take 1 tablet by mouth daily.    . cholecalciferol (VITAMIN D) 1000 UNITS tablet Take 1,000 Units by mouth daily.    Marland Kitchen donepezil (ARICEPT) 5 MG tablet Take 1 tablet by mouth once daily 90 tablet 0  . ELIQUIS 5 MG TABS tablet Take 1 tablet by mouth twice daily 60 tablet 5  .  lisinopril (ZESTRIL) 20 MG tablet Take 1 tablet (20 mg total) by mouth daily. 90 tablet 3  . metoprolol tartrate (LOPRESSOR) 25 MG tablet Take 1 tablet by mouth twice daily 180 tablet 3  . Multiple Vitamins-Minerals (ZINC PO) Take by mouth.    . triamcinolone cream (KENALOG) 0.1 % APPLY TO AFFECTED AREAS UP TO TWO TIMES DAILY AS NEEDED. AVOID FACE AND UNDERARMS.  1  . Zinc Sulfate (ZINC 15 PO) Take by mouth daily.     No current facility-administered medications on file prior to visit.    ALLERGIES: Allergies   Allergen Reactions  . Avelox [Moxifloxacin Hcl In Nacl] Hives  . Moxifloxacin   . Sulfa Antibiotics Rash    FAMILY HISTORY: Family History  Problem Relation Age of Onset  . Hypertension Mother   . Heart failure Mother   . Heart disease Mother   . Hypertension Father   . Asthma Sister   . Hypertension Sister   . Hypertension Sister   . Learning disabilities Sister     Objective:  Blood pressure 128/89, pulse 74, height 5\' 7"  (1.702 m), weight 141 lb 3.2 oz (64 kg), SpO2 99 %. General: No acute distress.  Patient appears well-groomed.   Head:  Normocephalic/atraumatic Eyes:  fundi examined but not visualized Neck: supple, no paraspinal tenderness, full range of motion Back: No paraspinal tenderness Heart: regular rate and rhythm Lungs: Clear to auscultation bilaterally. Vascular: No carotid bruits. Neurological Exam: Mental status: alert and oriented to person, place, and time, recent and remote memory intact, fund of knowledge intact, attention and concentration intact, speech fluent and not dysarthric, language intact. Cranial nerves: CN I: not tested CN II: pupils equal, round and reactive to light, visual fields intact CN III, IV, VI:  full range of motion, no nystagmus, no ptosis CN V: Decreased left V1-V3 CN VII: upper and lower face symmetric CN VIII: hearing intact CN IX, X: gag intact, uvula midline CN XI: sternocleidomastoid and trapezius muscles intact CN XII: tongue midline Bulk & Tone: normal, no fasciculations. Motor:  muscle strength 5/5 throughout Sensation:  Pinprick, temperature and vibratory sensation intact. Deep Tendon Reflexes:  2+ throughout,  toes downgoing.   Finger to nose testing:  Without dysmetria.   Heel to shin:  Without dysmetria.   Gait:  Normal station and stride.  Romberg negative.    Thank you for allowing me to take part in the care of this patient.  Metta Clines, DO  CC: Eliezer Lofts, MD

## 2020-09-10 ENCOUNTER — Ambulatory Visit: Payer: Medicare Other

## 2020-09-10 ENCOUNTER — Ambulatory Visit: Payer: Medicare Other | Admitting: Neurology

## 2020-09-10 ENCOUNTER — Encounter: Payer: Self-pay | Admitting: Neurology

## 2020-09-10 ENCOUNTER — Other Ambulatory Visit: Payer: Self-pay

## 2020-09-10 ENCOUNTER — Other Ambulatory Visit: Payer: Medicare Other

## 2020-09-10 VITALS — BP 128/89 | HR 74 | Ht 67.0 in | Wt 141.2 lb

## 2020-09-10 DIAGNOSIS — B0222 Postherpetic trigeminal neuralgia: Secondary | ICD-10-CM | POA: Diagnosis not present

## 2020-09-10 MED ORDER — NORTRIPTYLINE HCL 10 MG PO CAPS: ORAL_CAPSULE | ORAL | 0 refills | Status: DC

## 2020-09-10 NOTE — Patient Instructions (Signed)
1.  Stop amitriptyline 2.  Start nortriptyline 10mg  - take 1 capsule at bedtime for one week, then increase to 2 capsules at bedtime.  Contact me with update and refill in a month. 3.  Follow up 4 to 6 months

## 2020-09-11 ENCOUNTER — Ambulatory Visit (INDEPENDENT_AMBULATORY_CARE_PROVIDER_SITE_OTHER): Payer: Medicare Other

## 2020-09-11 ENCOUNTER — Other Ambulatory Visit (INDEPENDENT_AMBULATORY_CARE_PROVIDER_SITE_OTHER): Payer: Medicare Other

## 2020-09-11 ENCOUNTER — Other Ambulatory Visit: Payer: Self-pay

## 2020-09-11 DIAGNOSIS — I10 Essential (primary) hypertension: Secondary | ICD-10-CM

## 2020-09-11 DIAGNOSIS — E440 Moderate protein-calorie malnutrition: Secondary | ICD-10-CM

## 2020-09-11 DIAGNOSIS — Z0000 Encounter for general adult medical examination without abnormal findings: Secondary | ICD-10-CM | POA: Diagnosis not present

## 2020-09-11 LAB — COMPREHENSIVE METABOLIC PANEL
ALT: 19 U/L (ref 0–53)
AST: 20 U/L (ref 0–37)
Albumin: 4.2 g/dL (ref 3.5–5.2)
Alkaline Phosphatase: 117 U/L (ref 39–117)
BUN: 19 mg/dL (ref 6–23)
CO2: 33 mEq/L — ABNORMAL HIGH (ref 19–32)
Calcium: 9.6 mg/dL (ref 8.4–10.5)
Chloride: 102 mEq/L (ref 96–112)
Creatinine, Ser: 1.02 mg/dL (ref 0.40–1.50)
GFR: 65.17 mL/min (ref >60.00)
Glucose, Bld: 85 mg/dL (ref 70–99)
Potassium: 4 mEq/L (ref 3.5–5.1)
Sodium: 139 mEq/L (ref 135–145)
Total Bilirubin: 0.6 mg/dL (ref 0.2–1.2)
Total Protein: 7.2 g/dL (ref 6.0–8.3)

## 2020-09-11 LAB — CBC WITH DIFFERENTIAL/PLATELET
Basophils Absolute: 0.1 10*3/uL (ref 0.0–0.1)
Basophils Relative: 1 % (ref 0.0–3.0)
Eosinophils Absolute: 0.2 10*3/uL (ref 0.0–0.7)
Eosinophils Relative: 3.9 % (ref 0.0–5.0)
HCT: 41.4 % (ref 39.0–52.0)
Hemoglobin: 13.8 g/dL (ref 13.0–17.0)
Lymphocytes Relative: 31 % (ref 12.0–46.0)
Lymphs Abs: 1.9 10*3/uL (ref 0.7–4.0)
MCHC: 33.4 g/dL (ref 30.0–36.0)
MCV: 98 fl (ref 78.0–100.0)
Monocytes Absolute: 0.7 10*3/uL (ref 0.1–1.0)
Monocytes Relative: 10.6 % (ref 3.0–12.0)
Neutro Abs: 3.3 10*3/uL (ref 1.4–7.7)
Neutrophils Relative %: 53.5 % (ref 43.0–77.0)
Platelets: 226 10*3/uL (ref 150.0–400.0)
RBC: 4.23 Mil/uL (ref 4.22–5.81)
RDW: 14.5 % (ref 11.5–15.5)
WBC: 6.1 10*3/uL (ref 4.0–10.5)

## 2020-09-11 NOTE — Progress Notes (Signed)
Subjective:   [name] is a 87 y.o. male who presents for Medicare Annual/Subsequent preventive examination.  Review of Systems: N/A      I connected with the patient today by telephone and verified that I am speaking with the correct person using two identifiers. Location patient: home Location nurse: work Persons participating in the telephone visit: patient, nurse.   I discussed the limitations, risks, security and privacy concerns of performing an evaluation and management service by telephone and the availability of in person appointments. I also discussed with the patient that there may be a patient responsible charge related to this service. The patient expressed understanding and verbally consented to this telephonic visit.        Cardiac Risk Factors include: advanced age (>65men, >19 women);hypertension;male gender     Objective:    Today's Vitals   There is no height or weight on file to calculate BMI.  Advanced Directives 09/11/2020 09/10/2019 03/09/2015  Does Patient Have a Medical Advance Directive? Yes Yes Yes  Type of Paramedic of [place];Living will [place];Living will [place];Living will  Does patient want to make changes to medical advance directive? - - No - Patient declined  Copy of [place] in Chart? No - copy requested No - copy requested No - copy requested  Would patient like information on creating a medical advance directive? No - Patient declined - -    Current Medications (verified) Outpatient Encounter Medications as of 09/11/2020  Medication Sig  . amLODipine (NORVASC) 10 MG tablet Take 1 tablet (10 mg total) by mouth daily.  . Ascorbic Acid (VITA-C PO) Take 1 tablet by mouth daily.  . cholecalciferol (VITAMIN D) 1000 UNITS tablet Take 1,000 Units by mouth daily.  Marland Kitchen donepezil (ARICEPT) 5 MG tablet Take 1 tablet by mouth once daily  . ELIQUIS 5 MG TABS tablet  Take 1 tablet by mouth twice daily  . lisinopril (ZESTRIL) 20 MG tablet Take 1 tablet (20 mg total) by mouth daily.  . metoprolol tartrate (LOPRESSOR) 25 MG tablet Take 1 tablet by mouth twice daily  . Multiple Vitamins-Minerals (ZINC PO) Take by mouth.  . nortriptyline (PAMELOR) 10 MG capsule Take 1 capsule at bedtime for one week, then increase to 2 capsules at bedtime  . Zinc Sulfate (ZINC 15 PO) Take by mouth daily.   No facility-administered encounter medications on file as of 09/11/2020.    Allergies (verified) Avelox [moxifloxacin hcl in nacl], Moxifloxacin, and Sulfa antibiotics   History: Past Medical History:  Diagnosis Date  . Arrhythmia   . Arthritis   . Atrial fibrillation ([place])   . Cancer ([place]) 1972  . ED (erectile dysfunction)   . Glaucoma   . History of chicken pox   . Hypertension   . Melanoma ([place])    left abdomen   Past Surgical History:  Procedure Laterality Date  . EP IMPLANTABLE DEVICE N/A 03/09/2015   Procedure: Pacemaker Implant;  Surgeon: Will Meredith Leeds, MD;  Location: [facility];  Service: Cardiovascular;  Laterality: N/A;  . NASAL SINUS SURGERY     Family History  Problem Relation Age of Onset  . Hypertension Mother   . Heart failure Mother   . Heart disease Mother   . Hypertension Father   . Asthma Sister   . Hypertension Sister   . Hypertension Sister   . Learning disabilities Sister    Social History   Socioeconomic History  .  Marital status: Widowed    Spouse name: Not on file  . Number of children: 1  . Years of education: 10  . Highest education level: High school graduate  Occupational History  . Not on file  Tobacco Use  . Smoking status: Former Smoker    Packs/day: 1.00    Years: 10.00    Pack years: 10.00    Quit date: 1968    Years since quitting: 54.3  . Smokeless tobacco: Former Systems developer    Quit date: 06/06/1966  Substance and Sexual Activity  . Alcohol use: Not Currently    Alcohol/week: 0.0 standard drinks   . Drug use: No  . Sexual activity: Never  Other Topics Concern  . Not on file  Social History Narrative   Right handed   Social Determinants of Health   Financial Resource Strain: Low Risk   . Difficulty of Paying Living Expenses: Not hard at all  Food Insecurity: No Food Insecurity  . Worried About Charity fundraiser in the Last Year: Never true  . Ran Out of Food in the Last Year: Never true  Transportation Needs: No Transportation Needs  . Lack of Transportation (Medical): No  . Lack of Transportation (Non-Medical): No  Physical Activity: Inactive  . Days of Exercise per Week: 0 days  . Minutes of Exercise per Session: 0 min  Stress: No Stress Concern Present  . Feeling of Stress : Not at all  Social Connections: Not on file    Tobacco Counseling Counseling given: Not Answered   Clinical Intake:  Pre-visit preparation completed: Yes  Pain : 0-10 Pain Type: Chronic pain Pain Location: Face Pain Orientation: Left Pain Descriptors / Indicators: Throbbing Pain Onset: More than a month ago Pain Frequency: Intermittent     Nutritional Risks: None Diabetes: No  How often do you need to have someone help you when you read instructions, pamphlets, or other written materials from your doctor or pharmacy?: 1 - Never  Diabetic: No Nutrition Risk Assessment:  Has the patient had any N/V/D within the last 2 months?  No  Does the patient have any non-healing wounds?  No  Has the patient had any unintentional weight loss or weight gain?  No   Diabetes:  Is the patient diabetic?  No  If diabetic, was a CBG obtained today?  N/A Did the patient bring in their glucometer from home?  N/A How often do you monitor your CBG's? N/A.   Financial Strains and Diabetes Management:  Are you having any financial strains with the device, your supplies or your medication? N/A.  Does the patient want to be seen by Chronic Care Management for management of their diabetes?   N/A Would the patient like to be referred to a Nutritionist or for Diabetic Management?  N/A    Interpreter Needed?: No  Information entered by :: CJohnson, LPN   Activities of Daily Living In your present state of health, do you have any difficulty performing the following activities: 09/11/2020 09/17/2019  Hearing? Tempie Donning  Comment wears hearing aids Hearing aids  Vision? N N  Difficulty concentrating or making decisions? Y N  Comment take medication for this -  Walking or climbing stairs? N N  Dressing or bathing? N N  Doing errands, shopping? N N  Preparing Food and eating ? N -  Using the Toilet? N -  In the past six months, have you accidently leaked urine? N -  Do you have problems with loss  of bowel control? N -  Managing your Medications? N -  Managing your Finances? N -  Housekeeping or managing your Housekeeping? N -  Some recent data might be hidden    Patient Care Team: Jinny Sanders, MD as PCP - General (Family Medicine) Constance Haw, MD as PCP - Electrophysiology (Cardiology)  Indicate any recent Medical Services you may have received from other than [facility] providers in the past year (date may be approximate).     Assessment:   This is a routine wellness examination for [place].  Hearing/Vision screen  Hearing Screening   125Hz  250Hz  500Hz  1000Hz  2000Hz  3000Hz  4000Hz  6000Hz  8000Hz   Right ear:           Left ear:           Vision Screening Comments: Patient gets annual eye exams   Dietary issues and exercise activities discussed: Current Exercise Habits: The patient does not participate in regular exercise at present, Exercise limited by: None identified  Goals    . Patient Stated     09/10/2019, I will maintain and continue medications as prescribed.     . Patient Stated     09/11/2020, I will maintain and continue medications as prescribed.      Depression Screen PHQ 2/9 Scores 09/11/2020 09/17/2019 09/10/2019  PHQ - 2 Score 0 0 1  PHQ- 9 Score 0 1  1    Fall Risk Fall Risk  09/11/2020 09/10/2019  Falls in the past year? 0 0  Number falls in past yr: 0 0  Injury with Fall? 0 0  Risk for fall due to : Medication side effect Medication side effect  Follow up Falls evaluation completed;Falls prevention discussed Falls evaluation completed;Falls prevention discussed    FALL RISK PREVENTION PERTAINING TO THE HOME:  Any stairs in or around the home? Yes  If so, are there any without handrails? No  Home free of loose throw rugs in walkways, pet beds, electrical cords, etc? Yes  Adequate lighting in your home to reduce risk of falls? Yes   ASSISTIVE DEVICES UTILIZED TO PREVENT FALLS:  Life alert? No  Use of a cane, walker or w/c? No  Grab bars in the bathroom? No  Shower chair or bench in shower? No  Elevated toilet seat or a handicapped toilet? No   TIMED UP AND GO:  Was the test performed? N/A telephone visit .    Cognitive Function: MMSE - Mini Mental State Exam 09/11/2020 09/10/2019  Not completed: Unable to complete -  Orientation to time - 5  Orientation to Place - 5  Registration - 3  Attention/ Calculation - 0  Attention/Calculation-comments - Patient refused to do this  Recall - 2  Language- repeat - 1  Cognitive status assessed by direct observation. Patient has current diagnosis of cognitive impairment. Patient is followed by neurology for ongoing assessment. Patient is unable to complete screening 6CIT or MMSE.   Mini Cog  Mini-Cog screen was not completed. Maximum score is 22. A value of 0 denotes this part of the MMSE was not completed or the patient failed this part of the Mini-Cog screening.       Immunizations Immunization History  Administered Date(s) Administered  . Fluad Quad(high Dose 65+) 03/19/2020  . Influenza, High Dose Seasonal PF 03/26/2015, 04/04/2019  . Pneumococcal Conjugate-13 06/02/2014  . Pneumococcal Polysaccharide-23 06/18/2007  . Tdap 09/30/2010  . Zoster Recombinat (Shingrix)  12/31/2018, 04/18/2019    TDAP status: Up to date  Flu Vaccine status: due Fall 2022  Pneumococcal vaccine status: Up to date  Covid-19 vaccine status: Declined, Education has been provided regarding the importance of this vaccine but patient still declined. Advised may receive this vaccine at local pharmacy or Health Dept.or vaccine clinic. Aware to provide a copy of the vaccination record if obtained from local pharmacy or Health Dept. Verbalized acceptance and understanding.  Qualifies for Shingles Vaccine? Yes   Zostavax completed No   Shingrix Completed?: Yes  Screening Tests Health Maintenance  Topic Date Due  . COVID-19 Vaccine (1) 09/27/2020 (Originally 03/21/1941)  . TETANUS/TDAP  09/29/2020  . INFLUENZA VACCINE  01/04/2021  . PNA vac Low Risk Adult  Completed  . HPV VACCINES  Aged Out    Health Maintenance  There are no preventive care reminders to display for this patient.  Colorectal cancer screening: No longer required.   Lung Cancer Screening: (Low Dose CT Chest recommended if Age 89-80 years, 30 pack-year currently smoking OR have quit w/in 15years.) does not qualify.    Additional Screening:  Hepatitis C Screening: does not qualify; Completed N/A  Vision Screening: Recommended annual ophthalmology exams for early detection of glaucoma and other disorders of the eye. Is the patient up to date with their annual eye exam?  Yes  Who is the provider or what is the name of the office in which the patient attends annual eye exams? Dr. Lorie Apley If pt is not established with a provider, would they like to be referred to a provider to establish care? No .   Dental Screening: Recommended annual dental exams for proper oral hygiene  Community Resource Referral / Chronic Care Management: CRR required this visit?  No   CCM required this visit?  No      Plan:     I have personally reviewed and noted the following in the patient's chart:   . Medical and  social history . Use of alcohol, tobacco or illicit drugs  . Current medications and supplements . Functional ability and status . Nutritional status . Physical activity . Advanced directives . List of other physicians . Hospitalizations, surgeries, and ER visits in previous 12 months . Vitals . Screenings to include cognitive, depression, and falls . Referrals and appointments  In addition, I have reviewed and discussed with patient certain preventive protocols, quality metrics, and best practice recommendations. A written personalized care plan for preventive services as well as general preventive health recommendations were provided to patient.   Due to this being a telephonic visit, the after visit summary with patients personalized plan was offered to patient via office or my-chart. Patient preferred to pick up at office at next visit or via mychart.   Andrez Grime, LPN   01/10/5642

## 2020-09-11 NOTE — Patient Instructions (Signed)
[name] , Thank you for taking time to come for your Medicare Wellness Visit. I appreciate your ongoing commitment to your health goals. Please review the following plan we discussed and let me know if I can assist you in the future.   Screening recommendations/referrals: Colonoscopy: no longer required  Recommended yearly ophthalmology/optometry visit for glaucoma screening and checkup Recommended yearly dental visit for hygiene and checkup  Vaccinations: Influenza vaccine: due 01/2021 Pneumococcal vaccine: Completed series Tdap vaccine: Up to date, completed 09/30/2010, due 09/29/2020 Shingles vaccine: Completed series   Covid-19: declined  Advanced directives: Please bring a copy of your POA (Power of Attorney) and/or Living Will to your next appointment.   Conditions/risks identified: hypertension  Next appointment: Follow up in one year for your annual wellness visit.   Preventive Care 85 Years and Older, Male Preventive care refers to lifestyle choices and visits with your health care provider that can promote health and wellness. What does preventive care include?  A yearly physical exam. This is also called an annual well check.  Dental exams once or twice a year.  Routine eye exams. Ask your health care provider how often you should have your eyes checked.  Personal lifestyle choices, including:  Daily care of your teeth and gums.  Regular physical activity.  Eating a healthy diet.  Avoiding tobacco and drug use.  Limiting alcohol use.  Practicing safe sex.  Taking low doses of aspirin every day.  Taking vitamin and mineral supplements as recommended by your health care provider. What happens during an annual well check? The services and screenings done by your health care provider during your annual well check will depend on your age, overall health, lifestyle risk factors, and family history of disease. Counseling  Your health care provider may ask you  questions about your:  Alcohol use.  Tobacco use.  Drug use.  Emotional well-being.  Home and relationship well-being.  Sexual activity.  Eating habits.  History of falls.  Memory and ability to understand (cognition).  Work and work Statistician. Screening  You may have the following tests or measurements:  Height, weight, and BMI.  Blood pressure.  Lipid and cholesterol levels. These may be checked every 5 years, or more frequently if you are over 56 years old.  Skin check.  Lung cancer screening. You may have this screening every year starting at age 11 if you have a 30-pack-year history of smoking and currently smoke or have quit within the past 15 years.  Fecal occult blood test (FOBT) of the stool. You may have this test every year starting at age 65.  Flexible sigmoidoscopy or colonoscopy. You may have a sigmoidoscopy every 5 years or a colonoscopy every 10 years starting at age 60.  Prostate cancer screening. Recommendations will vary depending on your family history and other risks.  Hepatitis C blood test.  Hepatitis B blood test.  Sexually transmitted disease (STD) testing.  Diabetes screening. This is done by checking your blood sugar (glucose) after you have not eaten for a while (fasting). You may have this done every 1-3 years.  Abdominal aortic aneurysm (AAA) screening. You may need this if you are a current or former smoker.  Osteoporosis. You may be screened starting at age 52 if you are at high risk. Talk with your health care provider about your test results, treatment options, and if necessary, the need for more tests. Vaccines  Your health care provider may recommend certain vaccines, such as:  Influenza vaccine.  This is recommended every year.  Tetanus, diphtheria, and acellular pertussis (Tdap, Td) vaccine. You may need a Td booster every 10 years.  Zoster vaccine. You may need this after age 33.  Pneumococcal 13-valent conjugate  (PCV13) vaccine. One dose is recommended after age 35.  Pneumococcal polysaccharide (PPSV23) vaccine. One dose is recommended after age 33. Talk to your health care provider about which screenings and vaccines you need and how often you need them. This information is not intended to replace advice given to you by your health care provider. Make sure you discuss any questions you have with your health care provider. Document Released: 06/19/2015 Document Revised: 02/10/2016 Document Reviewed: 03/24/2015 Elsevier Interactive Patient Education  2017 [employer] Prevention in the Home Falls can cause injuries. They can happen to people of all ages. There are many things you can do to make your home safe and to help prevent falls. What can I do on the outside of my home?  Regularly fix the edges of walkways and driveways and fix any cracks.  Remove anything that might make you trip as you walk through a door, such as a raised step or threshold.  Trim any bushes or trees on the path to your home.  Use bright outdoor lighting.  Clear any walking paths of anything that might make someone trip, such as rocks or tools.  Regularly check to see if handrails are loose or broken. Make sure that both sides of any steps have handrails.  Any raised decks and porches should have guardrails on the edges.  Have any leaves, snow, or ice cleared regularly.  Use sand or salt on walking paths during winter.  Clean up any spills in your garage right away. This includes oil or grease spills. What can I do in the bathroom?  Use night lights.  Install grab bars by the toilet and in the tub and shower. Do not use towel bars as grab bars.  Use non-skid mats or decals in the tub or shower.  If you need to sit down in the shower, use a plastic, non-slip stool.  Keep the floor dry. Clean up any water that spills on the floor as soon as it happens.  Remove soap buildup in the tub or shower  regularly.  Attach bath mats securely with double-sided non-slip rug tape.  Do not have throw rugs and other things on the floor that can make you trip. What can I do in the bedroom?  Use night lights.  Make sure that you have a light by your bed that is easy to reach.  Do not use any sheets or blankets that are too big for your bed. They should not hang down onto the floor.  Have a firm chair that has side arms. You can use this for support while you get dressed.  Do not have throw rugs and other things on the floor that can make you trip. What can I do in the kitchen?  Clean up any spills right away.  Avoid walking on wet floors.  Keep items that you use a lot in easy-to-reach places.  If you need to reach something above you, use a strong step stool that has a grab bar.  Keep electrical cords out of the way.  Do not use floor polish or wax that makes floors slippery. If you must use wax, use non-skid floor wax.  Do not have throw rugs and other things on the floor that can make  you trip. What can I do with my stairs?  Do not leave any items on the stairs.  Make sure that there are handrails on both sides of the stairs and use them. Fix handrails that are broken or loose. Make sure that handrails are as long as the stairways.  Check any carpeting to make sure that it is firmly attached to the stairs. Fix any carpet that is loose or worn.  Avoid having throw rugs at the top or bottom of the stairs. If you do have throw rugs, attach them to the floor with carpet tape.  Make sure that you have a light switch at the top of the stairs and the bottom of the stairs. If you do not have them, ask someone to add them for you. What else can I do to help prevent falls?  Wear shoes that:  Do not have high heels.  Have rubber bottoms.  Are comfortable and fit you well.  Are closed at the toe. Do not wear sandals.  If you use a stepladder:  Make sure that it is fully  opened. Do not climb a closed stepladder.  Make sure that both sides of the stepladder are locked into place.  Ask someone to hold it for you, if possible.  Clearly mark and make sure that you can see:  Any grab bars or handrails.  First and last steps.  Where the edge of each step is.  Use tools that help you move around (mobility aids) if they are needed. These include:  Canes.  Walkers.  Scooters.  Crutches.  Turn on the lights when you go into a dark area. Replace any light bulbs as soon as they burn out.  Set up your furniture so you have a clear path. Avoid moving your furniture around.  If any of your floors are uneven, fix them.  If there are any pets around you, be aware of where they are.  Review your medicines with your doctor. Some medicines can make you feel dizzy. This can increase your chance of falling. Ask your doctor what other things that you can do to help prevent falls. This information is not intended to replace advice given to you by your health care provider. Make sure you discuss any questions you have with your health care provider. Document Released: 03/19/2009 Document Revised: 10/29/2015 Document Reviewed: 06/27/2014 Elsevier Interactive Patient Education  2017 [employer].

## 2020-09-11 NOTE — Progress Notes (Signed)
No critical labs need to be addressed urgently. We will discuss labs in detail at upcoming office visit.   

## 2020-09-11 NOTE — Progress Notes (Signed)
PCP notes:  Health Maintenance: Covid- declined   Abnormal Screenings: none   Patient concerns: Ongoing complications from shingles    Nurse concerns: none   Next PCP appt.: 09/17/2020 @ 8:40 am

## 2020-09-12 LAB — PREALBUMIN: Prealbumin: 25 mg/dL (ref 21–43)

## 2020-09-13 NOTE — Progress Notes (Signed)
No critical labs need to be addressed urgently. We will discuss labs in detail at upcoming office visit.   

## 2020-09-17 ENCOUNTER — Other Ambulatory Visit: Payer: Self-pay

## 2020-09-17 ENCOUNTER — Encounter: Payer: Self-pay | Admitting: Family Medicine

## 2020-09-17 ENCOUNTER — Ambulatory Visit (INDEPENDENT_AMBULATORY_CARE_PROVIDER_SITE_OTHER): Payer: Medicare Other | Admitting: Family Medicine

## 2020-09-17 VITALS — BP 110/62 | HR 54 | Temp 98.1°F | Ht 66.0 in | Wt 139.2 lb

## 2020-09-17 DIAGNOSIS — Z0000 Encounter for general adult medical examination without abnormal findings: Secondary | ICD-10-CM

## 2020-09-17 DIAGNOSIS — H9193 Unspecified hearing loss, bilateral: Secondary | ICD-10-CM | POA: Diagnosis not present

## 2020-09-17 DIAGNOSIS — I480 Paroxysmal atrial fibrillation: Secondary | ICD-10-CM | POA: Diagnosis not present

## 2020-09-17 DIAGNOSIS — G500 Trigeminal neuralgia: Secondary | ICD-10-CM

## 2020-09-17 DIAGNOSIS — F0390 Unspecified dementia without behavioral disturbance: Secondary | ICD-10-CM

## 2020-09-17 DIAGNOSIS — I10 Essential (primary) hypertension: Secondary | ICD-10-CM

## 2020-09-17 DIAGNOSIS — Z85820 Personal history of malignant melanoma of skin: Secondary | ICD-10-CM

## 2020-09-17 DIAGNOSIS — F03A0 Unspecified dementia, mild, without behavioral disturbance, psychotic disturbance, mood disturbance, and anxiety: Secondary | ICD-10-CM

## 2020-09-17 NOTE — Progress Notes (Signed)
Patient ID: [name], male    DOB: [birth year], 87 y.o.   MRN: [id number]  This visit was conducted in person.  BP 110/62   Pulse (!) 54   Temp 98.1 F (36.7 C) (Temporal)   Ht 5\' 6"  (1.676 m)   Wt 139 lb 4 oz (63.2 kg)   SpO2 98%   BMI 22.48 kg/m    CC:  Chief Complaint  Patient presents with  . Annual Exam    Part 2    Subjective:   HPI: [name] is a 87 y.o. male presenting on 09/17/2020 for Annual Exam (Part 2) complete physical and review of chronic health problems. He/She also has the following acute concerns today:  The patient saw a LPN or RN for medicare wellness visit.  Prevention and wellness was reviewed in detail. Note reviewed and important notes copied below.  Health Maintenance: Covid- declined Abnormal Screenings: None  09/17/20  Trigeminal neuralgia... currently seeing neurology.Marland Kitchen latest note reviewed.  Stopped amytriptiline given ineffective.. changed to nortriptyline and will plan titration up as tolerated better.  no SE but no benefit so far.. tonight increase the dose to 2 at night.  Paroxsysmal Afib and AV heart block: Followed by cardiology, last OV reviewed. On eliquis.  Rate controlled on amldipine and metolprolol.  Hypertension:  Good control on metoprolol, amlodipine and lisinopril BP Readings from Last 3 Encounters:  09/17/20 110/62  09/10/20 128/89  07/07/20 114/74  Using medication without problems or lightheadedness:  None Chest pain with exertion: none Edema:none Short of breath:none Average home BPs: Other issues:  History of melanoma: no skin lesions.  Mild dementia: Stable and no SE to aricept. No change per pt.     Still with weight loss, but albumin an prealbumin in nml range.Likely due to trigeminal neuralgia.  Wt Readings from Last 3 Encounters:  09/17/20 139 lb 4 oz (63.2 kg)  09/10/20 141 lb 3.2 oz (64 kg)  07/07/20 140 lb 8 oz (63.7 kg)      Relevant past medical, surgical, family and social  history reviewed and updated as indicated. Interim medical history since our last visit reviewed. Allergies and medications reviewed and updated. Outpatient Medications Prior to Visit  Medication Sig Dispense Refill  . amLODipine (NORVASC) 10 MG tablet Take 1 tablet (10 mg total) by mouth daily. 90 tablet 3  . Ascorbic Acid (VITA-C PO) Take 1 tablet by mouth daily.    . cholecalciferol (VITAMIN D) 1000 UNITS tablet Take 1,000 Units by mouth daily.    Marland Kitchen donepezil (ARICEPT) 5 MG tablet Take 1 tablet by mouth once daily 90 tablet 0  . ELIQUIS 5 MG TABS tablet Take 1 tablet by mouth twice daily 60 tablet 5  . lisinopril (ZESTRIL) 20 MG tablet Take 1 tablet (20 mg total) by mouth daily. 90 tablet 3  . metoprolol tartrate (LOPRESSOR) 25 MG tablet Take 1 tablet by mouth twice daily 180 tablet 3  . Multiple Vitamins-Minerals (ZINC PO) Take by mouth.    . nortriptyline (PAMELOR) 10 MG capsule Take 1 capsule at bedtime for one week, then increase to 2 capsules at bedtime 60 capsule 0  . Zinc Sulfate (ZINC 15 PO) Take by mouth daily.     No facility-administered medications prior to visit.     Per HPI unless specifically indicated in ROS section below Review of Systems  Constitutional: Negative for fatigue and fever.  HENT: Negative for ear pain.   Eyes: Negative for pain.  Respiratory: Negative  for cough and shortness of breath.   Cardiovascular: Negative for chest pain, palpitations and leg swelling.  Gastrointestinal: Negative for abdominal pain.  Genitourinary: Negative for dysuria.  Musculoskeletal: Negative for arthralgias.  Neurological: Negative for syncope, light-headedness and headaches.  Psychiatric/Behavioral: Negative for dysphoric mood.   Objective:  BP 110/62   Pulse (!) 54   Temp 98.1 F (36.7 C) (Temporal)   Ht 5\' 6"  (1.676 m)   Wt 139 lb 4 oz (63.2 kg)   SpO2 98%   BMI 22.48 kg/m   Wt Readings from Last 3 Encounters:  09/17/20 139 lb 4 oz (63.2 kg)  09/10/20 141 lb  3.2 oz (64 kg)  07/07/20 140 lb 8 oz (63.7 kg)      Physical Exam Constitutional:      Appearance: He is well-developed.  HENT:     Head: Normocephalic.     Right Ear: Hearing normal.     Left Ear: Hearing normal.     Nose: Nose normal.  Neck:     Thyroid: No thyroid mass or thyromegaly.     Vascular: No carotid bruit.     Trachea: Trachea normal.  Cardiovascular:     Rate and Rhythm: Normal rate and regular rhythm.     Pulses: Normal pulses.     Heart sounds: Heart sounds not distant. No murmur heard. No friction rub. No gallop.      Comments: No peripheral edema Pulmonary:     Effort: Pulmonary effort is normal. No respiratory distress.     Breath sounds: Normal breath sounds.  Skin:    General: Skin is warm and dry.     Findings: No rash.  Psychiatric:        Speech: Speech normal.        Behavior: Behavior normal.        Thought Content: Thought content normal.       Results for orders placed or performed in visit on 09/11/20  Prealbumin  Result Value Ref Range   Prealbumin 25 21 - 43 mg/dL  CBC with Differential/Platelet  Result Value Ref Range   WBC 6.1 4.0 - 10.5 K/uL   RBC 4.23 4.22 - 5.81 Mil/uL   Hemoglobin 13.8 13.0 - 17.0 g/dL   HCT 41.4 39.0 - 52.0 %   MCV 98.0 78.0 - 100.0 fl   MCHC 33.4 30.0 - 36.0 g/dL   RDW 14.5 11.5 - 15.5 %   Platelets 226.0 150.0 - 400.0 K/uL   Neutrophils Relative % 53.5 43.0 - 77.0 %   Lymphocytes Relative 31.0 12.0 - 46.0 %   Monocytes Relative 10.6 3.0 - 12.0 %   Eosinophils Relative 3.9 0.0 - 5.0 %   Basophils Relative 1.0 0.0 - 3.0 %   Neutro Abs 3.3 1.4 - 7.7 K/uL   Lymphs Abs 1.9 0.7 - 4.0 K/uL   Monocytes Absolute 0.7 0.1 - 1.0 K/uL   Eosinophils Absolute 0.2 0.0 - 0.7 K/uL   Basophils Absolute 0.1 0.0 - 0.1 K/uL  Comprehensive metabolic panel  Result Value Ref Range   Sodium 139 135 - 145 mEq/L   Potassium 4.0 3.5 - 5.1 mEq/L   Chloride 102 96 - 112 mEq/L   CO2 33 (H) 19 - 32 mEq/L   Glucose, Bld 85 70 -  99 mg/dL   BUN 19 6 - 23 mg/dL   Creatinine, Ser 1.02 0.40 - 1.50 mg/dL   Total Bilirubin 0.6 0.2 - 1.2 mg/dL   Alkaline Phosphatase 117 39 -  117 U/L   AST 20 0 - 37 U/L   ALT 19 0 - 53 U/L   Total Protein 7.2 6.0 - 8.3 g/dL   Albumin 4.2 3.5 - 5.2 g/dL   GFR 65.17 >60.00 mL/min   Calcium 9.6 8.4 - 10.5 mg/dL    This visit occurred during the SARS-CoV-2 public health emergency.  Safety protocols were in place, including screening questions prior to the visit, additional usage of staff PPE, and extensive cleaning of exam room while observing appropriate contact time as indicated for disinfecting solutions.   COVID 19 screen:  No recent travel or known exposure to COVID19 The patient denies respiratory symptoms of COVID 19 at this time. The importance of social distancing was discussed today.   Assessment and Plan The patient's preventative maintenance and recommended screening tests for an annual wellness exam were reviewed in full today. Brought up to date unless services declined.  Counselled on the importance of diet, exercise, and its role in overall health and mortality. The patient's FH and SH was reviewed, including their home life, tobacco status, and drug and alcohol status.   Prostate Cancer Screen: not indicated due to age Colon Cancer Screen: not indicated due to age.      Smoking Status: former ETOH/ drug JSH:FWYO/VZCH   Problem List Items Addressed This Visit    Essential hypertension    Stable, chronic.  Continue current medication.   amlodipine 10 mg daily metoprolol tartrate 25 mg BID  lisinopril 20 mg daily      Hearing loss   History of melanoma    No new or changing skin lesions.. followed closely by Derm.      Mild dementia (HCC)    Stable control on aricept 5 mg daily, no SE.      Paroxysmal atrial fibrillation (HCC)    Stable, chronic.  Continue current medication.   Rate controlled with amlodipine 10 mg daily, metoprolol tartrate 25 mg  BID  On Eliquis 5 mg BID anticoagulation      Trigeminal neuralgia pain     Severe pain.. contributing to eating issues an weight loss.  Plan to increase nortriptyline 10 mg  per neuro to 20 mg qHS tonight.       Other Visit Diagnoses    Routine general medical examination at a health care facility    -  Primary          Eliezer Lofts, MD

## 2020-09-17 NOTE — Assessment & Plan Note (Signed)
Stable control on aricept 5 mg daily, no SE.

## 2020-09-17 NOTE — Patient Instructions (Addendum)
Increase nortriptyline to 2 at night as planned.  If you have a side effect of constipation .. can use Miralax 17g daily OTC as needed.  Keep working on [employer].Marland Kitchen try not to skip meals.

## 2020-09-17 NOTE — Assessment & Plan Note (Signed)
Stable, chronic.  Continue current medication.   Rate controlled with amlodipine 10 mg daily, metoprolol tartrate 25 mg BID  On Eliquis 5 mg BID anticoagulation

## 2020-09-17 NOTE — Assessment & Plan Note (Signed)
Severe pain.. contributing to eating issues an weight loss.  Plan to increase nortriptyline 10 mg  per neuro to 20 mg qHS tonight.

## 2020-09-17 NOTE — Assessment & Plan Note (Signed)
Stable, chronic.  Continue current medication.   amlodipine 10 mg daily metoprolol tartrate 25 mg BID  lisinopril 20 mg daily

## 2020-09-17 NOTE — Assessment & Plan Note (Signed)
No new or changing skin lesions.. followed closely by Derm.

## 2020-09-30 ENCOUNTER — Other Ambulatory Visit: Payer: Self-pay | Admitting: Cardiology

## 2020-09-30 DIAGNOSIS — I441 Atrioventricular block, second degree: Secondary | ICD-10-CM

## 2020-09-30 NOTE — Telephone Encounter (Signed)
Prescription refill request for Eliquis received.  Indication: afib  Last office visit: [place], 04/21/2020 Scr: 1.02, 09/11/2020 Age: 87 yo  Weight: 63.2 kg    Pt is on the correct dose of Eliquis per dosing criteria, prescription refill sent for Eliquis 5 mg BID.

## 2020-10-07 ENCOUNTER — Other Ambulatory Visit: Payer: Self-pay

## 2020-10-07 MED ORDER — NORTRIPTYLINE HCL 50 MG PO CAPS: 50.0000 mg | ORAL_CAPSULE | Freq: Every day | ORAL | 0 refills | Status: DC

## 2020-10-07 NOTE — Progress Notes (Signed)
Per Dr.Jaffe increase Nortriptyline to 50 mg at bedtime.  Script sent to pharmacy on file.

## 2020-10-14 ENCOUNTER — Other Ambulatory Visit: Payer: Self-pay

## 2020-10-14 MED ORDER — LAMOTRIGINE 25 MG PO TABS: ORAL_TABLET | ORAL | 0 refills | Status: DC

## 2020-10-19 ENCOUNTER — Ambulatory Visit (INDEPENDENT_AMBULATORY_CARE_PROVIDER_SITE_OTHER): Payer: Medicare Other

## 2020-10-19 DIAGNOSIS — I4430 Unspecified atrioventricular block: Secondary | ICD-10-CM | POA: Diagnosis not present

## 2020-10-20 ENCOUNTER — Other Ambulatory Visit: Payer: Self-pay | Admitting: Family Medicine

## 2020-10-21 LAB — CUP PACEART REMOTE DEVICE CHECK
Battery Remaining Longevity: 116 mo
Battery Remaining Percentage: 95.5 %
Battery Voltage: 2.96 V
Brady Statistic AP VP Percent: 75 %
Brady Statistic AP VS Percent: 1 %
Brady Statistic AS VP Percent: 23 %
Brady Statistic AS VS Percent: 1 %
Brady Statistic RA Percent Paced: 73 %
Brady Statistic RV Percent Paced: 98 %
Date Time Interrogation Session: 20220516020012
Implantable Lead Implant Date: 20161003
Implantable Lead Implant Date: 20161003
Implantable Lead Location: 753859
Implantable Lead Location: 753860
Implantable Pulse Generator Implant Date: 20161003
Lead Channel Impedance Value: 390 Ohm
Lead Channel Impedance Value: 410 Ohm
Lead Channel Pacing Threshold Amplitude: 0.5 V
Lead Channel Pacing Threshold Amplitude: 0.5 V
Lead Channel Pacing Threshold Pulse Width: 0.4 ms
Lead Channel Pacing Threshold Pulse Width: 0.4 ms
Lead Channel Sensing Intrinsic Amplitude: 10.4 mV
Lead Channel Sensing Intrinsic Amplitude: 5 mV
Lead Channel Setting Pacing Amplitude: 0.75 V
Lead Channel Setting Pacing Amplitude: 1.5 V
Lead Channel Setting Pacing Pulse Width: 0.4 ms
Lead Channel Setting Sensing Sensitivity: 2 mV
Pulse Gen Model: 2240
Pulse Gen Serial Number: 7818292

## 2020-10-21 NOTE — Telephone Encounter (Signed)
Pharmacy requests refill on: Amlodipine 10 mg & Lisinopril 20 mg   LAST REFILL: 10/18/2019 (Q-90, R-3) LAST OV: 09/17/2020 NEXT OV: 03/23/2021 PHARMACY: [id number] [place], [place]

## 2020-10-22 DIAGNOSIS — H16223 Keratoconjunctivitis sicca, not specified as Sjogren's, bilateral: Secondary | ICD-10-CM | POA: Diagnosis not present

## 2020-10-23 NOTE — Telephone Encounter (Signed)
Follow up:     Patient is returning the nurse call back. best number [phone].

## 2020-10-23 NOTE — Telephone Encounter (Signed)
Left message to call back  

## 2020-10-23 NOTE — Telephone Encounter (Signed)
Pt informed he would need to start Coumadin if he were to take the Carbamazepine.  Pt does NOT wish to do that. He is going to continue his current treatment plan and not switch at this time. He appreciates my follow up

## 2020-10-23 NOTE — Telephone Encounter (Signed)
Pt informed he would need to start Coumadin if he were to take the Carbamazepine.  Pt does NOT wish to do that. He is going to continue his current treatment plan and not switch at this time. He appreciates my follow up 

## 2020-10-27 ENCOUNTER — Telehealth: Payer: Self-pay | Admitting: Neurology

## 2020-10-27 DIAGNOSIS — B0222 Postherpetic trigeminal neuralgia: Secondary | ICD-10-CM | POA: Diagnosis not present

## 2020-10-27 NOTE — Telephone Encounter (Signed)
If the dermatologist performs Botox for neuralgia, then I have no objection to that.  However, I do not perform Botox for neuralgia.

## 2020-10-27 NOTE — Telephone Encounter (Signed)
Patient brought by a visit note from his dermatologist. Placed in Dr. Georgie Chard box for review.  Patient is interested in starting Botox to help with his neuralgia. Please review and advise patient.

## 2020-10-28 NOTE — Telephone Encounter (Signed)
Patient is aware to reach out to his dermatologist for the Botox.

## 2020-11-11 NOTE — Progress Notes (Signed)
Remote pacemaker transmission.   

## 2020-11-23 DIAGNOSIS — G500 Trigeminal neuralgia: Secondary | ICD-10-CM

## 2020-11-26 DIAGNOSIS — H40003 Preglaucoma, unspecified, bilateral: Secondary | ICD-10-CM | POA: Diagnosis not present

## 2020-11-27 NOTE — Telephone Encounter (Signed)
Patient requests consultation with Dr. Jaynee Eagles.

## 2020-12-01 ENCOUNTER — Other Ambulatory Visit: Payer: Self-pay | Admitting: Family Medicine

## 2020-12-01 MED ORDER — HYDROCODONE-ACETAMINOPHEN 5-325 MG PO TABS: 0.5000 | ORAL_TABLET | Freq: Every evening | ORAL | 0 refills | Status: DC | PRN

## 2020-12-03 ENCOUNTER — Ambulatory Visit: Payer: Medicare Other | Admitting: Neurology

## 2020-12-03 ENCOUNTER — Encounter: Payer: Self-pay | Admitting: Neurology

## 2020-12-03 ENCOUNTER — Other Ambulatory Visit: Payer: Self-pay

## 2020-12-03 VITALS — BP 132/63 | HR 60 | Ht 67.0 in | Wt 132.0 lb

## 2020-12-03 DIAGNOSIS — R519 Headache, unspecified: Secondary | ICD-10-CM | POA: Diagnosis not present

## 2020-12-03 DIAGNOSIS — G500 Trigeminal neuralgia: Secondary | ICD-10-CM | POA: Diagnosis not present

## 2020-12-03 DIAGNOSIS — R200 Anesthesia of skin: Secondary | ICD-10-CM | POA: Diagnosis not present

## 2020-12-03 MED ORDER — LAMOTRIGINE 25 MG PO TABS: ORAL_TABLET | ORAL | 0 refills | Status: AC

## 2020-12-03 NOTE — Progress Notes (Addendum)
[facility]    Provider:  Dr Jaynee Eagles Requesting Provider: Jinny Sanders, MD Primary Care Provider:  Jinny Sanders, MD  CC:  Trigeminal neuralgia  HPI:  [name] is a 87 y.o. male here as requested by Jinny Sanders, MD for trigeminal neuralgia. PMHx arthritis, A. fib, erectile dysfunction, glaucoma, hypertension, pacemaker implant, hearing loss, mild dementia, trigeminal neuralgia, osteoarthritis with chronic jaw pain.   Here with his son and patient reports the pain started on the left side of the face, no rash at the time the pain started, he never really had a rash, ear ache, pain on the left side of the face v1,v2,v3, He went to the dentist, the ENT, 4 months laths later he saw a dermatologist who suggested it may have been shingles. Pain to open mouth, can't chew on that side, can't brush his teeth can trigger, soreness on the left side of the face. Can be throbbing, pounding, triggered by chewing or brushing or touching, no history of migraines, no light sensitivity, no nausea, no autonomic symptoms. He has the pain every day. Worse in the morning. Difficult to eat and chew. Alleve helps. Severe pain. None of the medications have helped.    Medications tried: Gabapentin was ineffective, side effects to Lyrica, ointments ineffective, he is unable to take carbamazepine due to potentially reducing efficacy of Eliquis, amitriptyline did not help, switch to nortriptyline, Tylenol, Aleve.  Reviewed notes, labs and imaging from outside physicians, which showed:  Patient was seen by neurology already, diagnosed with left-sided postherpetic trigeminal neuralgia, left-sided facial pain began in January 2021, aching burning, very painful, causing difficulty opening his mouth and using, he seen both ENT and the dentist with normal exams, maxillofacial CT in April 2021 showed no pathology.  He had a rash that was observed by dermatologist who diagnosed shingles but it was too  late to acutely treat it.  He has not found a medication to adequately control it.  Review of Systems: Patient complains of symptoms per HPI as well as the following symptoms face pain. Pertinent negatives and positives per HPI. All others negative.   Social History   Socioeconomic History   Marital status: Widowed    Spouse name: Not on file   Number of children: 1   Years of education: 12   Highest education level: High school graduate  Occupational History   Not on file  Tobacco Use   Smoking status: Former    Packs/day: 1.00    Years: 10.00    Pack years: 10.00    Types: Cigarettes    Quit date: 1968    Years since quitting: 54.5   Smokeless tobacco: Not on file   Tobacco comments:    Pt states he has never used smokeless tobacco  Vaping Use   Vaping Use: Never used  Substance and Sexual Activity   Alcohol use: Not Currently    Alcohol/week: 0.0 standard drinks   Drug use: No   Sexual activity: Never  Other Topics Concern   Not on file  Social History Narrative   Right handed   Lives alone, son is involved in care and lives closeby   Social Determinants of Health   Financial Resource Strain: Low Risk    Difficulty of Paying Living Expenses: Not hard at all  Food Insecurity: No Food Insecurity   Worried About Charity fundraiser in the Last Year: Never true   [place] in the Last Year: Never  true  Transportation Needs: No Transportation Needs   Lack of Transportation (Medical): No   Lack of Transportation (Non-Medical): No  Physical Activity: Inactive   Days of Exercise per Week: 0 days   Minutes of Exercise per Session: 0 min  Stress: No Stress Concern Present   Feeling of Stress : Not at all  Social Connections: Not on file  Intimate Partner Violence: Not At Risk   Fear of Current or Ex-Partner: No   Emotionally Abused: No   Physically Abused: No   Sexually Abused: No    Family History  Problem Relation Age of Onset   Hypertension Mother     Heart failure Mother    Heart disease Mother    Hypertension Father    Asthma Sister    Hypertension Sister    Hypertension Sister    Learning disabilities Sister     Past Medical History:  Diagnosis Date   Arrhythmia    Arthritis    Atrial fibrillation ([place])    Cancer [facility]) 1972   ED (erectile dysfunction)    Glaucoma    History of chicken pox    Hypertension    Melanoma ([place])    left abdomen    Patient Active Problem List   Diagnosis Date Noted   Trigeminal neuralgia pain 07/07/2020   Erectile dysfunction 11/01/2019   Chronic left ear pain 10/18/2019   Chronic jaw pain 10/18/2019   BPH associated with nocturia 09/17/2019   Osteoarthritis of lower back 07/11/2019   Hearing loss 07/11/2019   Mild dementia ([place]) 07/11/2019   Paroxysmal atrial fibrillation ([place]) 10/24/2016   Essential hypertension 10/24/2016   History of melanoma 10/24/2016   Heart block, AV 03/05/2015    Past Surgical History:  Procedure Laterality Date   EP IMPLANTABLE DEVICE N/A 03/09/2015   Procedure: Pacemaker Implant;  Surgeon: Will Meredith Leeds, MD;  Location: [facility];  Service: Cardiovascular;  Laterality: N/A;   NASAL SINUS SURGERY      Current Outpatient Medications  Medication Sig Dispense Refill   amLODipine (NORVASC) 10 MG tablet Take 1 tablet by mouth once daily 90 tablet 1   Ascorbic Acid (VITA-C PO) Take 1 tablet by mouth daily.     cholecalciferol (VITAMIN D) 1000 UNITS tablet Take 1,000 Units by mouth daily.     donepezil (ARICEPT) 5 MG tablet Take 1 tablet by mouth once daily 90 tablet 0   ELIQUIS 5 MG TABS tablet Take 1 tablet by mouth twice daily 180 tablet 1   lisinopril (ZESTRIL) 20 MG tablet Take 1 tablet by mouth once daily 90 tablet 1   metoprolol tartrate (LOPRESSOR) 25 MG tablet Take 1 tablet by mouth twice daily 180 tablet 3   Multiple Vitamins-Minerals (ZINC PO) Take by mouth.     Zinc Sulfate (ZINC 15 PO) Take by mouth daily.      HYDROcodone-acetaminophen (NORCO/VICODIN) 5-325 MG tablet Take 1 tablet by mouth at bedtime as needed for moderate pain or severe pain. 30 tablet 0   lamoTRIgine (LAMICTAL) 25 MG tablet Take 1 tablet at bedtime for 2 weeks, then 1 tablet twice daily for 2 weeks, then 2 tablets twice daily. 120 tablet 0   No current facility-administered medications for this visit.    Allergies as of 12/03/2020 - Review Complete 12/03/2020  Allergen Reaction Noted   Avelox [moxifloxacin hcl in nacl] Hives 03/06/2015   Moxifloxacin  03/26/2015   Sulfa antibiotics Rash 03/05/2015    Vitals: BP 132/63 (BP Location: Right  Arm, Patient Position: Sitting)   Pulse 60   Ht 5\' 7"  (1.702 m)   Wt 132 lb (59.9 kg)   BMI 20.67 kg/m  Last Weight:  Wt Readings from Last 1 Encounters:  12/03/20 132 lb (59.9 kg)   Last Height:   Ht Readings from Last 1 Encounters:  12/03/20 5\' 7"  (1.702 m)     Physical exam: Exam: Gen: NAD, conversant, well nourised, obese, well groomed                     CV: RRR, no MRG. No Carotid Bruits. No peripheral edema, warm, nontender Eyes: Conjunctivae clear without exudates or hemorrhage  Neuro: Detailed Neurologic Exam  Speech:    Speech is normal; fluent and spontaneous with normal comprehension.  Cognition:    The patient is oriented to person, place, and time;     recent and remote memory intact;     language fluent;     normal attention, concentration,     fund of knowledge Cranial Nerves:    The pupils are equal, round, and reactive to light. The fundi are flat. Visual fields are full to finger confrontation. Extraocular movements are intact. Trigeminal sensation is intact and the muscles of mastication are normal. The face is symmetric. The palate elevates in the midline. Hearing intact. Voice is normal. Shoulder shrug is normal. The tongue has normal motion without fasciculations.   Coordination:    Normal   Gait:     normal.   Motor Observation:    No  asymmetry, no atrophy, and no involuntary movements noted. Tone:    Normal muscle tone.    Posture:    Posture is normal. normal erect    Strength:    Strength is V/V in the upper and lower limbs.      Sensation: intact to LT     Reflex Exam:  DTR's:    Deep tendon reflexes in the upper and lower extremities are symmetrical bilaterally.   Toes:    The toes are downgoing bilaterally.   Clonus:    Clonus is absent.    Assessment/Plan:  We had a long discussion about his painful Trigeminal Neuralgia. I am skeptical that she had shingles or a virus to cause this because it did not coincide with his  trigeminal symptoms and she didn't really even have a rash. Discussed the most common cause is a vascular loop. Also he has failed multiple medications. At this time I would recommend trying Lamictal. At age and good health, I still would recommend decompression surgery and/or gamma knife or other procedure (he is 48, difficult treating with meds due to side effects of medications and she has failed most meds we have tried anyway)  - MRI TRigeminal protocol: Unfortunately we cannot have an MRI due to pacemaker, checked with [facility] and [facility]. Given all three branches affected I do suspect vascular loop compression but unclear how we could prove it. Will refer to Neurosurgery to see if they would consider decompression vs RFA/Gamma Knife. - Send to Neurosurgery for eval of decompression or RFA.other procedures - Try Lamictal in the meantime - We can try botox however I have rarely had success and injecting it in the area for TGN and would risk facial droop due to CN7 in the same area  Orders Placed This Encounter  Procedures   MR FACE/TRIGEMINAL WO/W CM    Meds ordered this encounter  Medications   lamoTRIgine (LAMICTAL) 25 MG  tablet    Sig: Take 1 tablet at bedtime for 2 weeks, then 1 tablet twice daily for 2 weeks, then 2 tablets twice daily.    Dispense:  120 tablet     Refill:  0    Cc: Jinny Sanders, MD,  Jinny Sanders, MD  Sarina Ill, MD  [facility] [address] [place], [place] [address]  Phone [phone] Fax [phone]

## 2020-12-03 NOTE — Patient Instructions (Addendum)
Dr. Arlan Organ, [facility] referral [facility] Try to get an MRI trigeminal protocol (vascular compressive loop onto the trigeminal nerve) Try Lamictal - Lamotrigine Other options include Radiofrequency ablation(gamma knife) of the trigeminal nerve Try to get approval for botox   Trigeminal Neuralgia  Trigeminal neuralgia is a nerve disorder that causes severe pain on one side of the face. The pain may last from a few seconds to several minutes. The pain isusually only on one side of the face. Symptoms may occur for days, weeks, or months and then go away for months oryears. The pain may return and be worse than before. What are the causes? This condition is caused by damage or pressure to a nerve in the head that is called the trigeminal nerve. An attack can be triggered by: Talking. Chewing. Putting on makeup. Washing your face. Shaving your face. Brushing your teeth. Touching your face. What increases the risk? You are more likely to develop this condition if you: Are 41 years of age or older. Are male. What are the signs or symptoms? The main symptom of this condition is severe pain in the: Jaw. Lips. Eyes. Nose. Scalp. Forehead. Face. The pain may be: Intense. Stabbing. Electric. Shock-like. How is this diagnosed? This condition is diagnosed with a physical exam. A CT scan or an MRI may bedone to rule out other conditions that can cause facial pain. How is this treated? This condition may be treated with: Avoiding the things that trigger your symptoms. Taking prescription medicines (anticonvulsants). Having surgery. This may be done in severe cases if other medical treatment does not provide relief. Having procedures such as ablation, thermal, or radiation therapy. It may take up to one month for treatment to start relieving the pain. Follow these instructions at home: Managing pain Learn as much as you can about how to manage your pain. Ask your health care provider  if a pain specialist would be helpful. Consider talking with a mental health care provider (psychologist) about how to cope with the pain. Consider joining a pain support group. General instructions Take over-the-counter and prescription medicines only as told by your health care provider. Avoid the things that trigger your symptoms. It may help to: Chew on the unaffected side of your mouth. Avoid touching your face. Avoid blasts of hot or cold air. Follow your treatment plan as told by your health care provider. This may include: Cognitive or behavioral therapy. Gentle, regular exercise. Meditation or yoga. Aromatherapy. Keep all follow-up visits as told by your health care provider. You may need to be monitored closely to make sure treatment is working well for you. Where to find more information Facial Pain Association: fpa-support.org Contact a health care provider if: Your medicine is not helping your symptoms. You have side effects from the medicine used for treatment. You develop new, unexplained symptoms, such as: Double vision. Facial weakness. Facial numbness. Changes in hearing or balance. You feel depressed. Get help right away if: Your pain is severe and is not getting better. You develop suicidal thoughts. If you ever feel like you may hurt yourself or others, or have thoughts about taking your own life, get help right away. You can go to your nearest emergency department or call: Your local emergency services (911 in the [place] A suicide crisis helpline, such as the [facility] at (780)860-0665. This is open 24 hours a day. Summary Trigeminal neuralgia is a nerve disorder that causes severe pain on one side of the face. The  pain may last from a few seconds to several minutes. This condition is caused by damage or pressure to a nerve in the head that is called the trigeminal nerve. Treatment may include avoiding the things that trigger your  symptoms, taking medicines, or having surgery or procedures. It may take up to one month for treatment to start relieving the pain. Avoid the things that trigger your symptoms. Keep all follow-up visits as told by your health care provider. You may need to be monitored closely to make sure treatment is working well for you. This information is not intended to replace advice given to you by your health care provider. Make sure you discuss any questions you have with your healthcare provider. Document Revised: 04/09/2018 Document Reviewed: 04/09/2018 Elsevier Patient Education  2022 [place].  Lamotrigine tablets What is this medication? LAMOTRIGINE (la MOE Hendricks Limes) is used to control seizures in adults and children with epilepsy and Lennox-Gastaut syndrome. It is also used in adultsto treat bipolar disorder. This medicine may be used for other purposes; ask your health care provider orpharmacist if you have questions. COMMON BRAND NAME(S): Lamictal, Subvenite What should I tell my care team before I take this medication? They need to know if you have any of these conditions: heart disease history of irregular heartbeat immune system problems kidney disease liver disease low levels of folic acid in the blood lupus mental illness suicidal thoughts, plans, or attempt; a previous suicide attempt by you or a family member an unusual or allergic reaction to lamotrigine or other seizure medications, other medicines, foods, dyes, or preservatives pregnant or trying to get pregnant breast-feeding How should I use this medication? Take this medicine by mouth with a glass of water. Follow the directions on the prescription label. Do not chew these tablets. If this medicine upsets your stomach, take it with food or milk. Take your doses at regular intervals. Donot take your medicine more often than directed. A special MedGuide will be given to you by the pharmacist with each newprescription and  refill. Be sure to read this information carefully each time. Talk to your pediatrician regarding the use of this medicine in children. While this drug may be prescribed for children as young as 2 years for selectedconditions, precautions do apply. Overdosage: If you think you have taken too much of this medicine contact apoison control center or emergency room at once. NOTE: This medicine is only for you. Do not share this medicine with others. What if I miss a dose? If you miss a dose, take it as soon as you can. If it is almost time for yournext dose, take only that dose. Do not take double or extra doses. What may interact with this medication? atazanavir birth control pills certain medicines for irregular heartbeat certain medicines for seizures like carbamazepine, phenobarbital, phenytoin, primidone, valproic acid lopinavir rifampin ritonavir This list may not describe all possible interactions. Give your health care provider a list of all the medicines, herbs, non-prescription drugs, or dietary supplements you use. Also tell them if you smoke, drink alcohol, or use illegaldrugs. Some items may interact with your medicine. What should I watch for while using this medication? Visit your doctor or health care providerfor regular checks on your progress. If you take this medicine for seizures, wear a Medic Alert bracelet or necklace. Carry an identification card with information about your condition,medicines, and doctor or health care provider. It is important to take this medicine exactly as directed. When first starting  treatment, your dose will need to be adjusted slowly. It may take weeks or months before your dose is stable. You should contact your doctor or health care providerif your seizures get worse or if you have any new types of seizures. Do not stop taking this medicine unless instructed by your doctor or health care provider. Stopping your medicine suddenly can increase  yourseizures or their severity. This medicine may cause serious skin reactions. They can happen weeks to months after starting the medicine. Contact your health care provider right away if you notice fevers or flu-like symptoms with a rash. The rash may be red or purple and then turn into blisters or peeling of the skin. Or, you might notice a red rash with swelling of the face, lips or lymph nodes in your neck or underyour arms. You may get drowsy, dizzy, or have blurred vision. Do not drive, use machinery, or do anything that needs mental alertness until you know how this medicine affects you. To reduce dizzy or fainting spells, do not sit or stand up quickly, especially if you are an older patient. Alcohol can increasedrowsiness and dizziness. Avoid alcoholic drinks. If you are taking this medicine for bipolar disorder, it is important to report any changes in your mood to your doctor or health care provider. If your condition gets worse, you get mentally depressed, feel very hyperactive or manic, have difficulty sleeping, or have thoughts of hurting yourself or committing suicide, you need to get help from your health care providerright away. If you are a caregiver for someone taking this medicine for bipolar disorder, you should also report these behavioral changes right away. The use of this medicine may increase the chance of suicidal thoughts or actions. Payspecial attention to how you are responding while on this medicine. Your mouth may get dry. Chewing sugarless gum or sucking hard candy, and drinking plenty of water may help. Contact your doctor if the problem does notgo away or is severe. Women who become pregnant while using this medicine may enroll in the [place] Pregnancy Registry by calling [phone]. This registry collects information about the safety of antiepileptic drug use duringpregnancy. This medicine may cause a decrease in folic acid. You should make  sure that you get enough folic acid while you are taking this medicine. Discuss the foods youeat and the vitamins you take with your health care provider. What side effects may I notice from receiving this medication? Side effects that you should report to your doctor or health care professionalas soon as possible: allergic reactions (skin rash, itching or hives; swelling of the face, lips, or tongue) aseptic meningitis (stiff neck; sensitivity to light; headache; drowsiness; fever; nausea, vomiting; rash) changes in vision heartbeat rhythm changes (trouble breathing; chest pain; dizziness; fast, irregular heartbeat; feeling faint or lightheaded, falls) infection (fever, chills, cough, sore throat, pain or trouble passing urine) liver injury (dark yellow or brown urine; general ill feeling or flu-like symptoms; loss of appetite, right upper belly pain; unusually weak or tired, yellowing of the eyes or skin) low red blood cell counts (trouble breathing; feeling faint; lightheaded, falls; unusually weak or tired) rash, fever, and swollen lymph nodes redness, blistering, peeling, or loosening of the skin, including inside the mouth seizures suicidal thoughts, mood changes unusual bruising or bleeding Side effects that usually do not require medical attention (report to yourdoctor or health care professional if they continue or are bothersome): diarrhea dizziness nausea stomach pain tremors vomiting This list may not  describe all possible side effects. Call your doctor for medical advice about side effects. You may report side effects to FDA at1-800-FDA-1088. Where should I keep my medication? Keep out of the reach of children and pets. Store at [employer] [address] (77 degrees F). Protect from light. Get rid of any unusedmedicine after the expiration date. To get rid of medicines that are no longer needed or have expired: Take the medicine to a medicine take-back program. Check with your pharmacy or  law enforcement to find a location. If you cannot return the medicine, check the label or package insert to see if the medicine should be thrown out in the garbage or flushed down the toilet. If you are not sure, ask your health care provider. If it is safe to put it in the trash, empty the medicine out of the container. Mix the medicine with cat litter, dirt, coffee grounds, or other unwanted substance. Seal the mixture in a bag or container. Put it in the trash. NOTE: This sheet is a summary. It may not cover all possible information. If you have questions about this medicine, talk to your doctor, pharmacist, orhealth care provider.  2022 Elsevier/Gold Standard (2019-09-05 10:26:40)

## 2020-12-08 ENCOUNTER — Telehealth: Payer: Self-pay | Admitting: Neurology

## 2020-12-08 ENCOUNTER — Encounter: Payer: Self-pay | Admitting: Neurology

## 2020-12-08 NOTE — Telephone Encounter (Signed)
MRI face/trigeminal w/wo NPR via [facility] website- sent to GI for scheduling

## 2020-12-09 ENCOUNTER — Other Ambulatory Visit: Payer: Self-pay | Admitting: Neurology

## 2020-12-09 DIAGNOSIS — R200 Anesthesia of skin: Secondary | ICD-10-CM

## 2020-12-09 DIAGNOSIS — R519 Headache, unspecified: Secondary | ICD-10-CM

## 2020-12-09 DIAGNOSIS — G500 Trigeminal neuralgia: Secondary | ICD-10-CM

## 2020-12-10 ENCOUNTER — Telehealth: Payer: Self-pay | Admitting: Neurology

## 2020-12-10 MED ORDER — HYDROCODONE-ACETAMINOPHEN 5-325 MG PO TABS: 1.0000 | ORAL_TABLET | Freq: Every evening | ORAL | 0 refills | Status: AC | PRN

## 2020-12-10 NOTE — Telephone Encounter (Signed)
[name] from [facility] called stating that the pt has a [facility] that is not MRI safe and will be needing a CT Scan done instead. Please advise.

## 2020-12-10 NOTE — Telephone Encounter (Signed)
Son notified 

## 2020-12-10 NOTE — Telephone Encounter (Signed)
Hydrocodone is not on current medication list.

## 2020-12-13 ENCOUNTER — Telehealth: Payer: Self-pay | Admitting: Neurology

## 2020-12-13 NOTE — Telephone Encounter (Signed)
[name], I referred patient to Dr. Arlan Organ for microvascular decompression. Can you check that the referral went there and let patient know where they can call to followup on the appointment? He cannot have the MRi with his pacemaker so I need to call radiology and see if any CT protocol could examine the trigeminal nerve. thanks

## 2020-12-14 ENCOUNTER — Other Ambulatory Visit: Payer: Self-pay | Admitting: Neurology

## 2020-12-14 ENCOUNTER — Telehealth: Payer: Self-pay | Admitting: Neurology

## 2020-12-14 DIAGNOSIS — G500 Trigeminal neuralgia: Secondary | ICD-10-CM

## 2020-12-14 NOTE — Telephone Encounter (Signed)
I spoke with the patient's son, Antony Haste (on [place]), and provided the message below in detail by Dr. Jaynee Eagles regarding the fact that patient cannot have an MRI with his pacemaker and a CAT scan really would not provide the detail we need.  He verbalized understanding and is in agreement to go ahead and have the patient referred to Dr. Arlan Organ to discuss surgical options.  He will await a call to schedule the appointment.  He understands it may take up to a couple of weeks for the referral to process.  He was very appreciative for the care from Dr. Jaynee Eagles. His questions were answered.

## 2020-12-14 NOTE — Telephone Encounter (Signed)
Please call patient's family and let them know that I spoke to neuroradiologist and since he cannot complete an MRI it will be hard for [place] to get imaging of the trigeminal nerve.  The CAT scans really do not have enough detail.  But I think he should go up and see Dr. Arlan Organ anyway, this is his specialty, there is also a chance that [facility] may have special equipment where he can get an MRI with his pacemaker.  Or if there is a high likelihood that this is a vascular compression per Dr. Arlan Organ, Dr. Arlan Organ may go ahead and recommend the surgery anyway. And he also has other surgical options he may be able to offer. We will make sure that referral is up to [facility].  Thanks.

## 2020-12-15 NOTE — Telephone Encounter (Signed)
I called [facility] and spoke with Colletta Maryland, she advised me to fax the referral. Referral was faxed to [phone]. The phone number is [phone].

## 2020-12-18 ENCOUNTER — Other Ambulatory Visit: Payer: Self-pay | Admitting: Neurology

## 2020-12-18 DIAGNOSIS — G500 Trigeminal neuralgia: Secondary | ICD-10-CM

## 2020-12-21 NOTE — Telephone Encounter (Signed)
Referral faxed to [facility] for Dr. Zada Finders. Phone: [phone].

## 2020-12-31 DIAGNOSIS — G500 Trigeminal neuralgia: Secondary | ICD-10-CM | POA: Diagnosis not present

## 2021-01-10 ENCOUNTER — Other Ambulatory Visit: Payer: Self-pay | Admitting: Family Medicine

## 2021-01-13 ENCOUNTER — Other Ambulatory Visit: Payer: Self-pay | Admitting: Neurological Surgery

## 2021-01-18 ENCOUNTER — Ambulatory Visit (INDEPENDENT_AMBULATORY_CARE_PROVIDER_SITE_OTHER): Payer: Medicare Other

## 2021-01-18 DIAGNOSIS — I4430 Unspecified atrioventricular block: Secondary | ICD-10-CM

## 2021-01-19 ENCOUNTER — Telehealth: Payer: Self-pay | Admitting: *Deleted

## 2021-01-19 LAB — CUP PACEART REMOTE DEVICE CHECK
Battery Remaining Longevity: 46 mo
Battery Remaining Percentage: 38 %
Battery Voltage: 2.96 V
Brady Statistic AP VP Percent: 74 %
Brady Statistic AP VS Percent: 1 %
Brady Statistic AS VP Percent: 24 %
Brady Statistic AS VS Percent: 1 %
Brady Statistic RA Percent Paced: 73 %
Brady Statistic RV Percent Paced: 98 %
Date Time Interrogation Session: 20220815020013
Implantable Lead Implant Date: 20161003
Implantable Lead Implant Date: 20161003
Implantable Lead Location: 753859
Implantable Lead Location: 753860
Implantable Pulse Generator Implant Date: 20161003
Lead Channel Impedance Value: 390 Ohm
Lead Channel Impedance Value: 410 Ohm
Lead Channel Pacing Threshold Amplitude: 0.375 V
Lead Channel Pacing Threshold Amplitude: 0.625 V
Lead Channel Pacing Threshold Pulse Width: 0.4 ms
Lead Channel Pacing Threshold Pulse Width: 0.4 ms
Lead Channel Sensing Intrinsic Amplitude: 5 mV
Lead Channel Sensing Intrinsic Amplitude: 6 mV
Lead Channel Setting Pacing Amplitude: 0.875
Lead Channel Setting Pacing Amplitude: 1.375
Lead Channel Setting Pacing Pulse Width: 0.4 ms
Lead Channel Setting Sensing Sensitivity: 2 mV
Pulse Gen Model: 2240
Pulse Gen Serial Number: 7818292

## 2021-01-19 NOTE — Telephone Encounter (Signed)
   [facility] Pre-operative Risk Assessment    Patient Name: [name]  DOB: Dec 10, 1928 MRN: [id number]  HEARTCARE STAFF:  - IMPORTANT!!!!!! Under Visit Info/Reason for Call, type in Other and utilize the format Clearance MM/DD/YY or Clearance TBD. Do not use dashes or single digits. - Please review there is not already an duplicate clearance open for this procedure. - If request is for dental extraction, please clarify the # of teeth to be extracted. - If the patient is currently at the dentist's office, call Pre-Op Callback Staff (MA/nurse) to input urgent request.  - If the patient is not currently in the dentist office, please route to the Pre-Op pool.  Request for surgical clearance:  What type of surgery is being performed? Left trigeminal rhizotomy  When is this surgery scheduled? 01/27/21  What type of clearance is required (medical clearance vs. Pharmacy clearance to hold med vs. Both)? Both  Are there any medications that need to be held prior to surgery and how long? None listed but pt on Eliquis  Practice name and name of physician performing surgery? [facility], Dr Emelda Brothers  What is the office phone number? [phone]   7.   What is the office fax number? [phone]   8.   Anesthesia type (None, local, MAC, general) ? General   Jodiann Ognibene L 01/19/2021, 4:59 PM  _________________________________________________________________   (provider comments below)

## 2021-01-21 NOTE — Telephone Encounter (Signed)
   Name: [name]  DOB: 1928-09-30  MRN: [id number]   Primary Cardiologist: Dr. Curt Bears  Chart reviewed as part of pre-operative protocol coverage. Patient was contacted 01/21/2021 in reference to pre-operative risk assessment for pending surgery as outlined below.  Demetrio Gressman was last seen on 04/21/20 by Dr. Curt Bears.  Since that day, Ikie Caraway has done well.  He can complete 4.0 METS without angina (stairs several times daily).   Per our clinical pharmacist: Per office protocol, patient can hold Eliquis for 3 days prior to procedure  Therefore, based on ACC/AHA guidelines, the patient would be at acceptable risk for the planned procedure without further cardiovascular testing.   The patient was advised that if he develops new symptoms prior to surgery to contact our office to arrange for a follow-up visit, and he verbalized understanding.  I will route this recommendation to the requesting party via Epic fax function and remove from pre-op pool. Please call with questions.  [place], PA 01/21/2021, 10:08 AM

## 2021-01-21 NOTE — Telephone Encounter (Signed)
Patient with diagnosis of afib on Eliquis for anticoagulation.    Procedure: Left trigeminal rhizotomy Date of procedure: 01/27/21   CHA2DS2-VASc Score = 3  This indicates a 3.2% annual risk of stroke. The patient's score is based upon: CHF History: No HTN History: Yes Diabetes History: No Stroke History: No Vascular Disease History: No Age Score: 2 Gender Score: 0     CrCl 41.6 ml/min  Per office protocol, patient can hold Eliquis for 3 days prior to procedure.

## 2021-01-22 ENCOUNTER — Other Ambulatory Visit: Payer: Self-pay | Admitting: Neurological Surgery

## 2021-01-22 ENCOUNTER — Encounter: Payer: Self-pay | Admitting: Cardiology

## 2021-01-22 ENCOUNTER — Encounter (HOSPITAL_COMMUNITY): Payer: Self-pay

## 2021-01-22 ENCOUNTER — Encounter (HOSPITAL_COMMUNITY)
Admission: RE | Admit: 2021-01-22 | Discharge: 2021-01-22 | Disposition: A | Discharge status: Home / Self Care | Payer: Medicare Other | Source: Ambulatory Visit | Attending: Neurological Surgery | Admitting: Neurological Surgery

## 2021-01-22 ENCOUNTER — Other Ambulatory Visit: Payer: Self-pay

## 2021-01-22 DIAGNOSIS — Z01812 Encounter for preprocedural laboratory examination: Secondary | ICD-10-CM | POA: Diagnosis not present

## 2021-01-22 LAB — BASIC METABOLIC PANEL
Anion gap: 9 (ref 5–15)
BUN: 23 mg/dL (ref 8–23)
CO2: 28 mmol/L (ref 22–32)
Calcium: 9.5 mg/dL (ref 8.9–10.3)
Chloride: 102 mmol/L (ref 98–111)
Creatinine, Ser: 1.35 mg/dL — ABNORMAL HIGH (ref 0.61–1.24)
GFR, Estimated: 50 mL/min — ABNORMAL LOW (ref >60)
Glucose, Bld: 115 mg/dL — ABNORMAL HIGH (ref 70–99)
Potassium: 4.3 mmol/L (ref 3.5–5.1)
Sodium: 139 mmol/L (ref 135–145)

## 2021-01-22 LAB — CBC
HCT: 41.4 % (ref 39.0–52.0)
Hemoglobin: 13.6 g/dL (ref 13.0–17.0)
MCH: 32.1 pg (ref 26.0–34.0)
MCHC: 32.9 g/dL (ref 30.0–36.0)
MCV: 97.6 fL (ref 80.0–100.0)
Platelets: 313 10*3/uL (ref 150–400)
RBC: 4.24 MIL/uL (ref 4.22–5.81)
RDW: 13.2 % (ref 11.5–15.5)
WBC: 7.7 10*3/uL (ref 4.0–10.5)
nRBC: 0 % (ref 0.0–0.2)

## 2021-01-22 LAB — SARS CORONAVIRUS 2 (TAT 6-24 HRS): SARS Coronavirus 2: NEGATIVE

## 2021-01-22 NOTE — Progress Notes (Signed)
PCP - Dr. Diona Browner Cardiologist - Dr. Curt Bears  PPM/ICD - Pacemaker Device Orders - requested and received Rep Notified - yes   Chest x-ray -  EKG - 04/21/20 Stress Test -  ECHO -  Cardiac Cath -   Blood Thinner Instructions: per note - pt ok to stop Eliquis 3 days prior to surgery - pt last dose to be on 8/20 - pt verbally confirmed he was given these instructions by cardiology office   Aspirin Instructions:    COVID TEST- DOS - pt went to [facility] to be tested prior to PAT appt d/t being told by MD office that pt needed to be tested before surgery without letting pt know test had to be done at most 4 days prior to surgery. Per Pam, we will retest pt on DOS d/t transportation inconvenience for pt.    Anesthesia review: yes - ppm/ cardiac hx  Patient denies shortness of breath, fever, cough and chest pain at PAT appointment   All instructions explained to the patient, with a verbal understanding of the material. Patient agrees to go over the instructions while at home for a better understanding. Patient also instructed to self quarantine after being tested for COVID-19. The opportunity to ask questions was provided.

## 2021-01-22 NOTE — Progress Notes (Signed)
Your procedure is scheduled on Wednesday 01/27/21.  Report to [facility]tr[facility] "A" at 12:30 P.M., and check in at the Admitting office.  Call this number if you have problems the morning of surgery: [phone]  Call [phone] if you have any questions prior to your surgery date Monday-Friday 8am-4pm   Remember: Do not eat after midnight the night before your surgery  You may drink clear liquids until 11:30 AM the morning of your surgery.   Clear liquids allowed are: Water, Non-Citrus Juices (without pulp), Carbonated Beverages, Clear Tea, Black Coffee Only, and Gatorade   Take these medicines the morning of surgery with A SIP OF WATER: amLODipine (NORVASC) lamoTRIgine (LAMICTAL) metoprolol tartrate (LOPRESSOR)    Per your cardiology office protocol, please hold Eliquis for 3 days prior to procedure   As of today, STOP taking any Aspirin (unless otherwise instructed by your surgeon), Aleve, Naproxen, Ibuprofen, Motrin, Advil, Goody's, BC's, all herbal medications, fish oil, and all vitamins.    The Morning of Surgery  Do not wear jewelry  Do not wear lotions, powders, colognes, or deodorant Men may shave face and neck.  Do not bring valuables to the hospital.  [facility] is not responsible for any belongings or valuables.  If you are a smoker, DO NOT Smoke 24 hours prior to surgery  If you wear a CPAP at night please bring your mask the morning of surgery   Remember that you must have someone to transport you home after your surgery, and remain with you for 24 hours if you are discharged the same day.   Please bring cases for contacts, glasses, hearing aids, dentures or bridgework because it cannot be worn into surgery.    Leave your suitcase in the car.  After surgery it may be brought to your room.  For patients admitted to the hospital, discharge time will be determined by your treatment team.  Patients discharged the day of surgery will not be allowed  to drive home.    Special instructions:   [facility]- Preparing For Surgery  Before surgery, you can play an important role. Because skin is not sterile, your skin needs to be as free of germs as possible. You can reduce the number of germs on your skin by washing with CHG (chlorahexidine gluconate) Soap before surgery.  CHG is an antiseptic cleaner which kills germs and bonds with the skin to continue killing germs even after washing.    Oral Hygiene is also important to reduce your risk of infection.  Remember - BRUSH YOUR TEETH THE MORNING OF SURGERY WITH YOUR REGULAR TOOTHPASTE  Please do not use if you have an allergy to CHG or antibacterial soaps. If your skin becomes reddened/irritated stop using the CHG.  Do not shave (including legs and underarms) for at least 48 hours prior to first CHG shower. It is OK to shave your face.  Please follow these instructions carefully.   Shower the NIGHT BEFORE SURGERY and the MORNING OF SURGERY with CHG Soap.   If you chose to wash your hair and body, wash as usual with your normal shampoo and body-wash/soap.  Rinse your hair and body thoroughly to remove the shampoo and soap.  Apply CHG directly to the skin (ONLY FROM THE NECK DOWN) and wash gently with a scrungie or a clean washcloth.   Do not use on open wounds or open sores. Avoid contact with your eyes, ears, mouth and genitals (private parts). Wash Face and genitals (  private parts)  with your normal soap.   Wash thoroughly, paying special attention to the area where your surgery will be performed.  Thoroughly rinse your body with warm water from the neck down.  DO NOT shower/wash with your normal soap after using and rinsing off the CHG Soap.  Pat yourself dry with a CLEAN TOWEL.  Wear CLEAN PAJAMAS to bed the night before surgery  Place CLEAN SHEETS on your bed the night of your first shower and DO NOT SLEEP WITH PETS.  Wear comfortable clothes the morning of surgery.     Day  of Surgery:  Please shower the morning of surgery with the CHG soap Do not apply any deodorants/lotions. Please wear clean clothes to the hospital/surgery center.   Remember to brush your teeth WITH YOUR REGULAR TOOTHPASTE.   Please read over the following fact sheets that you were given.

## 2021-01-22 NOTE — Progress Notes (Signed)
[facility] DEVICE PROGRAMMING  Patient Information: Name:  [name]  DOB:  08-15-1928  MRN:  [id number]    Kayleen Memos, RN  P [facility] Device Cc: Clayborn Bigness, RN Planned Procedure:  Left trigeminal rhizotomy  Surgeon:  Dr. Emelda Brothers  Date of Procedure:  01/27/21  Cautery will be used.  Position during surgery:     Please send documentation back to:  [facility] (Fax # [phone])   Kayleen Memos, RN  01/22/2021 9:04 AM     Device Information:  Clinic EP Physician:  Allegra Lai, MD   Device Type:  Pacemaker Manufacturer and Phone #:  St. Jude/Abbott: [phone] Pacemaker Dependent?:  Yes.   Date of Last Device Check:  01/18/21 Normal Device Function?:  Yes.    Electrophysiologist's Recommendations:  Have magnet available. Provide continuous ECG monitoring when magnet is used or reprogramming is to be performed.  Procedure will likely interfere with device function.  Device should be programmed:  Asynchronous pacing during procedure and returned to normal programming after procedure  Per Device Clinic Standing Orders, York Ram, RN  12:47 PM 01/22/2021

## 2021-01-25 ENCOUNTER — Other Ambulatory Visit: Payer: Self-pay

## 2021-01-25 ENCOUNTER — Ambulatory Visit: Payer: Medicare Other | Admitting: Nurse Practitioner

## 2021-01-25 ENCOUNTER — Encounter: Payer: Self-pay | Admitting: Nurse Practitioner

## 2021-01-25 VITALS — BP 108/70 | HR 62 | Temp 97.7°F | Resp 14 | Ht 66.0 in | Wt 124.0 lb

## 2021-01-25 DIAGNOSIS — N390 Urinary tract infection, site not specified: Secondary | ICD-10-CM | POA: Diagnosis not present

## 2021-01-25 DIAGNOSIS — R300 Dysuria: Secondary | ICD-10-CM | POA: Insufficient documentation

## 2021-01-25 LAB — POCT URINALYSIS DIPSTICK
Blood, UA: NEGATIVE
Glucose, UA: POSITIVE — AB
Nitrite, UA: POSITIVE
Protein, UA: POSITIVE — AB
Spec Grav, UA: 1.02 (ref 1.010–1.025)
Urobilinogen, UA: >=8 E.U./dL — AB
pH, UA: 5 (ref 5.0–8.0)

## 2021-01-25 MED ORDER — FOSFOMYCIN TROMETHAMINE 3 G PO PACK: 3.0000 g | PACK | Freq: Once | ORAL | 0 refills | Status: AC

## 2021-01-25 NOTE — Anesthesia Preprocedure Evaluation (Addendum)
Anesthesia Evaluation  Patient identified by MRN, date of birth, ID band Patient awake    Reviewed: Allergy & Precautions, NPO status , Patient's Chart, lab work & pertinent test results, reviewed documented beta blocker date and time   History of Anesthesia Complications Negative for: history of anesthetic complications  Airway Mallampati: IV  TM Distance: >3 FB Neck ROM: Full  Mouth opening: Limited Mouth Opening  Dental  (+) Missing, Poor Dentition, Dental Advisory Given   Pulmonary former smoker,  01/27/2021 SARS coronavirus NEG   breath sounds clear to auscultation       Cardiovascular hypertension, Pt. on medications and Pt. on home beta blockers + dysrhythmias Atrial Fibrillation + pacemaker (for heart block)  Rhythm:Regular Rate:Normal  '21 ECHO: There is hypokinesis of the mid to distal anteroseptal wall (may be pacer related) with septal bounce. EF 55-60%. The left ventricle has normal function, mild MR   Neuro/Psych Trigeminal neuralgia glaucoma    GI/Hepatic negative GI ROS, Neg liver ROS,   Endo/Other  negative endocrine ROS  Renal/GU negative Renal ROS     Musculoskeletal  (+) Arthritis ,   Abdominal   Peds  Hematology eliquis   Anesthesia Other Findings   Reproductive/Obstetrics                           Anesthesia Physical Anesthesia Plan  ASA: 3  Anesthesia Plan: MAC   Post-op Pain Management:    Induction:   PONV Risk Score and Plan: 1 and Ondansetron  Airway Management Planned: Natural Airway and Simple Face Mask  Additional Equipment: None  Intra-op Plan:   Post-operative Plan:   Informed Consent: I have reviewed the patients History and Physical, chart, labs and discussed the procedure including the risks, benefits and alternatives for the proposed anesthesia with the patient or authorized representative who has indicated his/her understanding and  acceptance.     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  Preop clearance per telephone encounter 01/21/2021, "Chart reviewed as part of pre-operative protocol coverage. Patient was contacted8/18/2022in reference to pre-operative risk assessment for pending surgery as outlined below. Rube Sanchez last seen on11/16/21by Dr. Curt Bears. Since that day, Jemmie Rhinehart done well.He can complete 4.0 METS without angina (stairs several times daily).  Per our clinical pharmacist: Per office protocol, patient can holdEliquisfor 3days prior to procedure. Therefore, based on ACC/AHA guidelines, the patient would be at acceptable risk for the planned procedure without further cardiovascular testing."  Patient seen by PCP [name], NP on 01/25/2021 for UTI.  Started on fosfomycin.  Preop labs reviewed, mildly elevated creatinine 1.35, was unremarkable.  EKG 04/21/2020: A sensed, V paced rhythm.  Perioperative prescription for implanted cardiac device programming per progress note 01/22/2021: Device Information:  Clinic EP Physician:Will Curt Bears, MD  Device Type:Pacemaker Manufacturer and Phone #:[employer]. Jude/Abbott: [phone] Pacemaker Dependent?:Yes. Date of Last Device Check:8/15/22Normal Device Function?:Yes.  Electrophysiologist's Recommendations:  . Have magnet available. . Provide continuous ECG monitoring when magnet is used or reprogramming is to be performed. . Procedure will likely interfere with device function. Device should be programmed: Asynchronous pacing during procedure and returned to normal programming after procedure  TTE 11/13/2019: 1. There is hypokinesis of the mid to distal anteroseptal wall (may be   pacer  related) with septal bounce. . Left ventricular ejection fraction,  by estimation, is 55 to 60%. The left ventricle has normal function. The  left ventricle demonstrates regional  wall motion abnormalities (see scoring diagram/findings for description).  Left ventricular diastolic parameters are consistent with Grade I  diastolic dysfunction (impaired relaxation).  2. Right ventricular systolic function is normal. The right ventricular  size is normal. There is mildly elevated pulmonary artery systolic  pressure. The estimated right ventricular systolic pressure is 06.3 mmHg.  3. The mitral valve is normal in structure. Mild mitral valve  regurgitation. No evidence of mitral stenosis.  4. The aortic valve is normal in structure. Aortic valve regurgitation is  not visualized. Mild aortic valve sclerosis is present, with no evidence  of aortic valve stenosis.  5. Aortic dilatation noted. There is mild dilatation of the ascending  aorta measuring 39 mm.  6. The inferior vena cava is normal in size with greater than 50%  respiratory variability, suggesting right atrial pressure of 3 mmHg.   )      Anesthesia Quick Evaluation

## 2021-01-25 NOTE — Telephone Encounter (Signed)
Spoke with [name].  He is now scheduled to see [name] at 11:00 am.

## 2021-01-25 NOTE — Assessment & Plan Note (Signed)
Given UA in office we will treat patient for urinary tract infection.  Pending culture.  Did not give enough urine to allow for microscopy.  Given patient's intolerance to sulfa and low creatinine clearance we will opt for fosfomycin this treatment.  Did confirm with pharmacy about price and spoke with patient's son Zenia Resides and he stated should be financially viable. Start fosfomycin.

## 2021-01-25 NOTE — Progress Notes (Signed)
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  Preop clearance per telephone encounter 01/21/2021, "Chart reviewed as part of pre-operative protocol coverage. Patient was contacted 01/21/2021 in reference to pre-operative risk assessment for pending surgery as outlined below.  [name] was last seen on 04/21/20 by Dr. Curt Bears.  Since that day, [name] has done well.  He can complete 4.0 METS without angina (stairs several times daily).  Per our clinical pharmacist: Per office protocol, patient can hold Eliquis for 3 days prior to procedure. Therefore, based on ACC/AHA guidelines, the patient would be at acceptable risk for the planned procedure without further cardiovascular testing."  Patient seen by PCP Geoffrey Ito, NP on 01/25/2021 for UTI.  Started on fosfomycin.  Preop labs reviewed, mildly elevated creatinine 1.35, was unremarkable.  EKG 04/21/2020: A sensed, V paced rhythm.  Perioperative prescription for implanted cardiac device programming per progress note 01/22/2021: Device Information:   Clinic EP Physician:  Allegra Lai, MD    Device Type:  Pacemaker Manufacturer and Phone #:  [employer]. Jude/Abbott: [phone] Pacemaker Dependent?:  Yes.   Date of Last Device Check:  01/18/21           Normal Device Function?:  Yes.     Electrophysiologist's Recommendations:   Have magnet available. Provide continuous ECG monitoring when magnet is used or reprogramming is to be performed.  Procedure will likely interfere with device function.  Device should be programmed:  Asynchronous pacing during procedure and returned to normal programming after procedure  TTE 11/13/2019:  1. There is hypokinesis of the mid to distal anteroseptal wall (may be  pacer related) with septal bounce. . Left ventricular ejection fraction,  by estimation, is 55 to 60%. The left ventricle has normal function. The   left ventricle demonstrates regional   wall motion abnormalities (see scoring diagram/findings for description).  Left ventricular diastolic parameters are consistent with Grade I  diastolic dysfunction (impaired relaxation).   2. Right ventricular systolic function is normal. The right ventricular  size is normal. There is mildly elevated pulmonary artery systolic  pressure. The estimated right ventricular systolic pressure is 0000000 mmHg.   3. The mitral valve is normal in structure. Mild mitral valve  regurgitation. No evidence of mitral stenosis.   4. The aortic valve is normal in structure. Aortic valve regurgitation is  not visualized. Mild aortic valve sclerosis is present, with no evidence  of aortic valve stenosis.   5. Aortic dilatation noted. There is mild dilatation of the ascending  aorta measuring 39 mm.   6. The inferior vena cava is normal in size with greater than 50%  respiratory variability, suggesting right atrial pressure of 3 mmHg.    Wynonia Musty [facility]/Anesthesiology Phone [phone] 01/25/2021 3:59 PM

## 2021-01-25 NOTE — Telephone Encounter (Signed)
Left message for Mr. Fromm that I have him scheduled to see Romilda Garret today at 3:00 pm for UTI.  I ask that he call the office back to reschedule if this time does not work for him.

## 2021-01-25 NOTE — Assessment & Plan Note (Signed)
Patient states symptoms started Saturday.  He has been drinking cranberry juice and recently started taking over-the-counter AZO.  States has had mild relief with AZO.

## 2021-01-25 NOTE — Progress Notes (Signed)
Acute Office Visit  Subjective:    Patient ID: [name], male    DOB: Jan 28, 1929, 87 y.o.   MRN: [id number]  Chief Complaint  Patient presents with   Urinary Frequency    Burning with urination. Symptoms started on 01/23/21. Started taking AZO yesterday    HPI Patient is in today for dysuria  Patient states that symptoms started on 01/23/2021. Endorses dysuria incomplete emptying, and trouble starting stream. He has been using OTC AZO with symptom relief. Has a history of urinary tract infections in his 58s per patient report. States since his 76s forward no trouble out of urinary tract infections.  Past Medical History:  Diagnosis Date   Arrhythmia    Arthritis    Atrial fibrillation ([place])    Cancer [facility]) 1972   ED (erectile dysfunction)    Glaucoma    History of chicken pox    Hypertension    Melanoma ([place])    left abdomen    Past Surgical History:  Procedure Laterality Date   EP IMPLANTABLE DEVICE N/A 03/09/2015   Procedure: Pacemaker Implant;  Surgeon: Will Meredith Leeds, MD;  Location: [facility];  Service: Cardiovascular;  Laterality: N/A;   EYE SURGERY Bilateral    "about 10 years ago I had cataracts removed but it's been so long I'm not sure exactly what year"   NASAL SINUS SURGERY      Family History  Problem Relation Age of Onset   Hypertension Mother    Heart failure Mother    Heart disease Mother    Hypertension Father    Asthma Sister    Hypertension Sister    Hypertension Sister    Learning disabilities Sister     Social History   Socioeconomic History   Marital status: Widowed    Spouse name: Not on file   Number of children: 1   Years of education: 34   Highest education level: High school graduate  Occupational History   Not on file  Tobacco Use   Smoking status: Former    Packs/day: 1.00    Years: 10.00    Pack years: 10.00    Types: Cigarettes    Quit date: 1968    Years since quitting: 54.6   Smokeless tobacco:  Former    Quit date: 06/06/1966   Tobacco comments:    Pt states he has never used smokeless tobacco  Vaping Use   Vaping Use: Never used  Substance and Sexual Activity   Alcohol use: Not Currently    Alcohol/week: 0.0 standard drinks   Drug use: No   Sexual activity: Never  Other Topics Concern   Not on file  Social History Narrative   Right handed   Lives alone, son is involved in care and lives closeby   Social Determinants of Health   Financial Resource Strain: Low Risk    Difficulty of Paying Living Expenses: Not hard at all  Food Insecurity: No Food Insecurity   Worried About Charity fundraiser in the Last Year: Never true   Arboriculturist in the Last Year: Never true  Transportation Needs: No Transportation Needs   Lack of Transportation (Medical): No   Lack of Transportation (Non-Medical): No  Physical Activity: Inactive   Days of Exercise per Week: 0 days   Minutes of Exercise per Session: 0 min  Stress: No Stress Concern Present   Feeling of Stress : Not at all  Social Connections: Not on file  Intimate Partner Violence: Not At Risk   Fear of Current or Ex-Partner: No   Emotionally Abused: No   Physically Abused: No   Sexually Abused: No    Outpatient Medications Prior to Visit  Medication Sig Dispense Refill   amLODipine (NORVASC) 10 MG tablet Take 1 tablet by mouth once daily 90 tablet 1   cholecalciferol (VITAMIN D) 1000 UNITS tablet Take 1,000 Units by mouth daily.     donepezil (ARICEPT) 5 MG tablet Take 1 tablet by mouth once daily 90 tablet 2   ELIQUIS 5 MG TABS tablet Take 1 tablet by mouth twice daily 180 tablet 1   lamoTRIgine (LAMICTAL) 25 MG tablet Take 1 tablet at bedtime for 2 weeks, then 1 tablet twice daily for 2 weeks, then 2 tablets twice daily. (Patient taking differently: Take 25 mg by mouth 2 (two) times daily. Take 1 tablet at bedtime for 2 weeks, then 1 tablet twice daily for 2 weeks, then 2 tablets twice daily.) 120 tablet 0    levOCARNitine (CARNITINE PO) Take 500 mg by mouth daily.     lisinopril (ZESTRIL) 20 MG tablet Take 1 tablet by mouth once daily 90 tablet 1   metoprolol tartrate (LOPRESSOR) 25 MG tablet Take 1 tablet by mouth twice daily 180 tablet 3   zinc gluconate 50 MG tablet Take 50 mg by mouth daily.     No facility-administered medications prior to visit.    Allergies  Allergen Reactions   Moxifloxacin Hives   Sulfa Antibiotics Rash    Review of Systems  Constitutional:  Negative for chills and fever.  Respiratory:  Negative for shortness of breath.   Cardiovascular:  Negative for chest pain.  Gastrointestinal:  Negative for abdominal pain, nausea and vomiting.  Genitourinary:  Positive for difficulty urinating (Difficulty starting stream. feeling of incomplete emptying) and dysuria. Negative for flank pain.  Neurological:  Negative for weakness.      Objective:    Physical Exam Vitals and nursing note reviewed.  Constitutional:      Appearance: He is not ill-appearing or toxic-appearing.  Cardiovascular:     Rate and Rhythm: Normal rate and regular rhythm.     Heart sounds: Normal heart sounds.  Pulmonary:     Effort: Pulmonary effort is normal.     Breath sounds: Normal breath sounds.  Abdominal:     General: Abdomen is flat. Bowel sounds are normal. There is no distension.     Tenderness: There is no abdominal tenderness. There is no right CVA tenderness or left CVA tenderness.  Neurological:     Mental Status: He is alert.  Psychiatric:        Mood and Affect: Mood normal.        Behavior: Behavior normal.        Thought Content: Thought content normal.        Judgment: Judgment normal.    BP 108/70   Pulse 62   Temp 97.7 F (36.5 C)   Resp 14   Ht '5\' 6"'$  (1.676 m)   Wt 124 lb (56.2 kg)   SpO2 97%   BMI 20.01 kg/m  Wt Readings from Last 3 Encounters:  01/25/21 124 lb (56.2 kg)  01/22/21 123 lb (55.8 kg)  12/03/20 132 lb (59.9 kg)    Health Maintenance Due   Topic Date Due   COVID-19 Vaccine (1) Never done   TETANUS/TDAP  09/29/2020   INFLUENZA VACCINE  01/04/2021    There are no preventive care  reminders to display for this patient.   No results found for: TSH Lab Results  Component Value Date   WBC 7.7 01/22/2021   HGB 13.6 01/22/2021   HCT 41.4 01/22/2021   MCV 97.6 01/22/2021   PLT 313 01/22/2021   Lab Results  Component Value Date   NA 139 01/22/2021   K 4.3 01/22/2021   CO2 28 01/22/2021   GLUCOSE 115 (H) 01/22/2021   BUN 23 01/22/2021   CREATININE 1.35 (H) 01/22/2021   BILITOT 0.6 09/11/2020   ALKPHOS 117 09/11/2020   AST 20 09/11/2020   ALT 19 09/11/2020   PROT 7.2 09/11/2020   ALBUMIN 4.2 09/11/2020   CALCIUM 9.5 01/22/2021   ANIONGAP 9 01/22/2021   GFR 65.17 09/11/2020   No results found for: CHOL No results found for: HDL No results found for: LDLCALC No results found for: TRIG No results found for: CHOLHDL No results found for: HGBA1C     Assessment & Plan:   Problem List Items Addressed This Visit       Genitourinary   Urinary tract infection without hematuria    Given UA in office we will treat patient for urinary tract infection.  Pending culture.  Did not give enough urine to allow for microscopy.  Given patient's intolerance to sulfa and low creatinine clearance we will opt for fosfomycin this treatment.  Did confirm with pharmacy about price and spoke with patient's son Zenia Resides and he stated should be financially viable. Start fosfomycin.      Relevant Medications   fosfomycin (MONUROL) 3 g PACK   Other Relevant Orders   Urine Culture     Other   Dysuria - Primary    Patient states symptoms started Saturday.  He has been drinking cranberry juice and recently started taking over-the-counter AZO.  States has had mild relief with AZO.      Relevant Orders   POCT urinalysis dipstick (Completed)   Urine Culture     No orders of the defined types were placed in this encounter.  This  visit occurred during the SARS-CoV-2 public health emergency.  Safety protocols were in place, including screening questions prior to the visit, additional usage of staff PPE, and extensive cleaning of exam room while observing appropriate contact time as indicated for disinfecting solutions.   Romilda Garret, NP

## 2021-01-25 NOTE — Patient Instructions (Signed)
Can continue to take the AZO for 2 days if helpful. After 2 days stop so we can see if hte antibiotic is helping.  Called the antibiotic in to the pharmacy. Can start taking it today. Follow up if symptoms do not improve or worsen.

## 2021-01-26 LAB — URINE CULTURE
MICRO NUMBER:: 12273368
SPECIMEN QUALITY:: ADEQUATE

## 2021-01-27 ENCOUNTER — Encounter (HOSPITAL_COMMUNITY): Payer: Self-pay | Admitting: Neurological Surgery

## 2021-01-27 ENCOUNTER — Encounter (HOSPITAL_COMMUNITY)
Admission: RE | Disposition: A | Discharge status: Home / Self Care | Payer: Self-pay | Source: Home / Self Care | Attending: Neurological Surgery

## 2021-01-27 ENCOUNTER — Ambulatory Visit (HOSPITAL_COMMUNITY): Payer: Medicare Other

## 2021-01-27 ENCOUNTER — Ambulatory Visit (HOSPITAL_COMMUNITY): Payer: Medicare Other | Admitting: Registered Nurse

## 2021-01-27 ENCOUNTER — Other Ambulatory Visit: Payer: Self-pay

## 2021-01-27 ENCOUNTER — Ambulatory Visit (HOSPITAL_COMMUNITY): Payer: Medicare Other | Admitting: Physician Assistant

## 2021-01-27 ENCOUNTER — Ambulatory Visit (HOSPITAL_COMMUNITY)
Admission: RE | Admit: 2021-01-27 | Discharge: 2021-01-27 | Disposition: A | Discharge status: Home / Self Care | Payer: Medicare Other | Attending: Neurological Surgery | Admitting: Neurological Surgery

## 2021-01-27 DIAGNOSIS — I10 Essential (primary) hypertension: Secondary | ICD-10-CM | POA: Diagnosis not present

## 2021-01-27 DIAGNOSIS — G508 Other disorders of trigeminal nerve: Secondary | ICD-10-CM | POA: Diagnosis not present

## 2021-01-27 DIAGNOSIS — G500 Trigeminal neuralgia: Secondary | ICD-10-CM | POA: Diagnosis not present

## 2021-01-27 DIAGNOSIS — N390 Urinary tract infection, site not specified: Secondary | ICD-10-CM | POA: Diagnosis not present

## 2021-01-27 DIAGNOSIS — I480 Paroxysmal atrial fibrillation: Secondary | ICD-10-CM | POA: Diagnosis not present

## 2021-01-27 DIAGNOSIS — Z20822 Contact with and (suspected) exposure to covid-19: Secondary | ICD-10-CM | POA: Insufficient documentation

## 2021-01-27 DIAGNOSIS — I441 Atrioventricular block, second degree: Secondary | ICD-10-CM

## 2021-01-27 DIAGNOSIS — Z419 Encounter for procedure for purposes other than remedying health state, unspecified: Secondary | ICD-10-CM

## 2021-01-27 DIAGNOSIS — Z87891 Personal history of nicotine dependence: Secondary | ICD-10-CM | POA: Diagnosis not present

## 2021-01-27 DIAGNOSIS — Z950 Presence of cardiac pacemaker: Secondary | ICD-10-CM | POA: Insufficient documentation

## 2021-01-27 HISTORY — PX: NEUROLYSIS OF MEDIAN NERVE: SHX6237

## 2021-01-27 LAB — SARS CORONAVIRUS 2 BY RT PCR (HOSPITAL ORDER, PERFORMED IN ~~LOC~~ HOSPITAL LAB): SARS Coronavirus 2: NEGATIVE

## 2021-01-27 SURGERY — NEUROLYSIS OF MEDIAN NERVE
Anesthesia: Monitor Anesthesia Care | Laterality: Left

## 2021-01-27 MED ORDER — CEFAZOLIN SODIUM-DEXTROSE 2-4 GM/100ML-% IV SOLN
2.0000 g | INTRAVENOUS | Status: AC
  Administered 2021-01-27: 2 g via INTRAVENOUS

## 2021-01-27 MED ORDER — PROPOFOL 10 MG/ML IV BOLUS
INTRAVENOUS | Status: AC
  Filled 2021-01-27: qty 20

## 2021-01-27 MED ORDER — CHLORHEXIDINE GLUCONATE CLOTH 2 % EX PADS: 6.0000 | MEDICATED_PAD | Freq: Once | CUTANEOUS | Status: DC

## 2021-01-27 MED ORDER — LIDOCAINE-EPINEPHRINE 1 %-1:100000 IJ SOLN
INTRAMUSCULAR | Status: AC
  Filled 2021-01-27: qty 1

## 2021-01-27 MED ORDER — MIDAZOLAM HCL 2 MG/2ML IJ SOLN
INTRAMUSCULAR | Status: DC | PRN
  Administered 2021-01-27: .5 mg via INTRAVENOUS

## 2021-01-27 MED ORDER — APIXABAN 5 MG PO TABS: 5.0000 mg | ORAL_TABLET | Freq: Two times a day (BID) | ORAL | 1 refills | Status: DC

## 2021-01-27 MED ORDER — ORAL CARE MOUTH RINSE: 15.0000 mL | Freq: Once | OROMUCOSAL | Status: AC

## 2021-01-27 MED ORDER — LACTATED RINGERS IV SOLN: INTRAVENOUS | Status: DC

## 2021-01-27 MED ORDER — CHLORHEXIDINE GLUCONATE 0.12 % MT SOLN: 15.0000 mL | Freq: Once | OROMUCOSAL | Status: AC

## 2021-01-27 MED ORDER — PROPOFOL 10 MG/ML IV BOLUS
INTRAVENOUS | Status: DC | PRN
  Administered 2021-01-27: 30 mg via INTRAVENOUS

## 2021-01-27 MED ORDER — CEFAZOLIN SODIUM-DEXTROSE 2-4 GM/100ML-% IV SOLN
INTRAVENOUS | Status: AC
  Filled 2021-01-27: qty 100

## 2021-01-27 MED ORDER — LIDOCAINE HCL 1 % IJ SOLN
INTRAMUSCULAR | Status: AC
  Filled 2021-01-27: qty 20

## 2021-01-27 MED ORDER — FENTANYL CITRATE (PF) 250 MCG/5ML IJ SOLN
INTRAMUSCULAR | Status: AC
  Filled 2021-01-27: qty 5

## 2021-01-27 MED ORDER — CHLORHEXIDINE GLUCONATE 0.12 % MT SOLN
OROMUCOSAL | Status: AC
  Administered 2021-01-27: 15 mL via OROMUCOSAL
  Filled 2021-01-27: qty 15

## 2021-01-27 MED ORDER — MIDAZOLAM HCL 2 MG/2ML IJ SOLN
INTRAMUSCULAR | Status: AC
  Filled 2021-01-27: qty 2

## 2021-01-27 MED ORDER — FENTANYL CITRATE (PF) 250 MCG/5ML IJ SOLN
INTRAMUSCULAR | Status: DC | PRN
  Administered 2021-01-27 (×2): 25 ug via INTRAVENOUS

## 2021-01-27 MED ORDER — LIDOCAINE HCL (PF) 1 % IJ SOLN
INTRAMUSCULAR | Status: DC | PRN
  Administered 2021-01-27: 3 mL

## 2021-01-27 SURGICAL SUPPLY — 29 items
BAG COUNTER SPONGE SURGICOUNT (BAG) ×2 IMPLANT
BAG SPNG CNTER NS LX DISP (BAG) ×1
BNDG ADH 1X3 SHEER STRL LF (GAUZE/BANDAGES/DRESSINGS) ×2 IMPLANT
BNDG ADH THN 3X1 STRL LF (GAUZE/BANDAGES/DRESSINGS) ×1
CANNULA PERC RF ACTIVE TIP 20G (CANNULA) ×2 IMPLANT
COVER BACK TABLE 60X90IN (DRAPES) ×2 IMPLANT
DECANTER SPIKE VIAL GLASS SM (MISCELLANEOUS) ×2 IMPLANT
DRAPE HALF SHEET 40X57 (DRAPES) ×2 IMPLANT
DURAPREP 6ML APPLICATOR 50/CS (WOUND CARE) ×2 IMPLANT
ELECT REM PT RETURN 9FT ADLT (ELECTROSURGICAL) ×2
ELECTRODE REM PT RTRN 9FT ADLT (ELECTROSURGICAL) ×1 IMPLANT
GAUZE 4X4 16PLY ~~LOC~~+RFID DBL (SPONGE) ×2 IMPLANT
GLOVE EXAM NITRILE XL STR (GLOVE) IMPLANT
GLOVE SRG 8 PF TXTR STRL LF DI (GLOVE) ×2 IMPLANT
GLOVE SURG LTX SZ8 (GLOVE) ×2 IMPLANT
GLOVE SURG UNDER POLY LF SZ8 (GLOVE) ×4
GOWN STRL REUS W/ TWL LRG LVL3 (GOWN DISPOSABLE) IMPLANT
GOWN STRL REUS W/ TWL XL LVL3 (GOWN DISPOSABLE) IMPLANT
GOWN STRL REUS W/TWL 2XL LVL3 (GOWN DISPOSABLE) IMPLANT
GOWN STRL REUS W/TWL LRG LVL3 (GOWN DISPOSABLE)
GOWN STRL REUS W/TWL XL LVL3 (GOWN DISPOSABLE)
KIT BASIN OR (CUSTOM PROCEDURE TRAY) ×2 IMPLANT
KIT TURNOVER KIT B (KITS) ×2 IMPLANT
MARKER SKIN DUAL TIP RULER LAB (MISCELLANEOUS) ×2 IMPLANT
NEEDLE HYPO 25X1 1.5 SAFETY (NEEDLE) ×2 IMPLANT
NS IRRIG 1000ML POUR BTL (IV SOLUTION) ×2 IMPLANT
PAD ARMBOARD 7.5X6 YLW CONV (MISCELLANEOUS) ×2 IMPLANT
SYR CONTROL 10ML LL (SYRINGE) ×2 IMPLANT
WATER STERILE IRR 1000ML POUR (IV SOLUTION) ×2 IMPLANT

## 2021-01-27 NOTE — H&P (Signed)
Surgical H&P Update  HPI: 87 y.o. man with left facial pain c/w trigeminal neuralgia. No improvement with medications, I therefore recommended a rhizotomy. Symptoms today are V1 > V2 > V3. No changes in health since he was last seen. Still having facial pain and wishes to proceed with surgery.  PMHx:  Past Medical History:  Diagnosis Date   Arrhythmia    Arthritis    Atrial fibrillation ([place])    Cancer ([place]) 1972   ED (erectile dysfunction)    Glaucoma    History of chicken pox    Hypertension    Melanoma ([place])    left abdomen   FamHx:  Family History  Problem Relation Age of Onset   Hypertension Mother    Heart failure Mother    Heart disease Mother    Hypertension Father    Asthma Sister    Hypertension Sister    Hypertension Sister    Learning disabilities Sister    SocHx:  reports that he quit smoking about 54 years ago. His smoking use included cigarettes. He has a 10.00 pack-year smoking history. He quit smokeless tobacco use about 54 years ago. He reports that he does not currently use alcohol. He reports that he does not use drugs.  Physical Exam: AOx3, PERRL, FS with some mild numbness on the left, fairly diffuse across all 3 branches, TM  Strength 5/5 x4, SILTx4  Assesment/Plan: 87 y.o. man with L TGN, here for L trigeminal rhizotomy. Risks, benefits, and alternatives discussed and the patient would like to continue with surgery.  -OR today -can go home from PACU if he's doing well   Judith Part, MD 01/27/21 3:09 PM

## 2021-01-27 NOTE — Anesthesia Postprocedure Evaluation (Signed)
Anesthesia Post Note  Patient: [name]  Procedure(s) Performed: Left trigeminal rhizotomy (Left)     Patient location during evaluation: PACU Anesthesia Type: MAC Level of consciousness: awake and alert, patient cooperative and oriented Pain management: pain level controlled Vital Signs Assessment: post-procedure vital signs reviewed and stable Respiratory status: spontaneous breathing, nonlabored ventilation and respiratory function stable Cardiovascular status: blood pressure returned to baseline and stable Postop Assessment: no apparent nausea or vomiting Anesthetic complications: no   No notable events documented.  Last Vitals:  Vitals:   01/27/21 1653 01/27/21 1707  BP: 130/64 112/70  Pulse: 68 60  Resp: 17 12  Temp: 36.7 C 36.7 C  SpO2: 99% 100%    Last Pain:  Vitals:   01/27/21 1707  TempSrc:   PainSc: 0-No pain                 Kazaria Gaertner,E. Loye Reininger

## 2021-01-27 NOTE — Anesthesia Procedure Notes (Signed)
Procedure Name: MAC Date/Time: 01/27/2021 4:05 PM Performed by: Dorthea Cove, CRNA Pre-anesthesia Checklist: Patient identified, Emergency Drugs available, Suction available, Patient being monitored and Timeout performed Patient Re-evaluated:Patient Re-evaluated prior to induction Oxygen Delivery Method: Nasal cannula Preoxygenation: Pre-oxygenation with 100% oxygen Induction Type: IV induction Placement Confirmation: positive ETCO2 and CO2 detector Dental Injury: Teeth and Oropharynx as per pre-operative assessment

## 2021-01-27 NOTE — Op Note (Signed)
PATIENT: [name]  DAY OF SURGERY: 01/27/21   PRE-OPERATIVE DIAGNOSIS:  Left trigeminal neuralgia   POST-OPERATIVE DIAGNOSIS:  Same   PROCEDURE:  Left trigeminal rhizotomy   SURGEON:  Surgeon(s) and Role:    Judith Part, MD - Primary   ANESTHESIA: ETGA   BRIEF HISTORY: This is an 87 year old man who presented with left sided TGN, likely secondary to shingles. His pain was severe and he was miserable. Given his age and overall health, I recommended a rhizotomy. This was discussed with the patient as well as risks, benefits, and alternatives and wished to proceed with surgery.   OPERATIVE DETAIL: The patient was taken to the operating room and placed on the OR table in the supine position. A formal time out was performed with two patient identifiers and confirmed the operative site.   The operative site was marked, 2.5cm lateral to the corner of the lips, the area was then prepped and draped in a sterile fashion. With a gloved hand guiding the needle in the oropharynx, sterile technique was used to guide a needle towards the medial canthus and a point anterior to the tragus while using fluoroscopic shots as needed. The needle was advanced into the foramen ovale with the characteristic masseter contraction. The electrode was placed into the cannula, testing was performed. Unfortunately, the patient was confused and required multiple rounds of testing. He was getting more V1 symptoms than I would like so I changed the trajectory to improve contact with V2 and V3 with better paresthesias in the V1 > V2 distribution. The patient was given a bolus of anesthetic, and the lesion was created with an RF probe set to 70 degrees celsius for 60 seconds. Repeat testing showed good hypesthesia in the V1 > V2 region, the needle was therefore removed and the patient was returned to anesthesia.   The patient was then returned to anesthesia for emergence. No apparent complications at the completion of  the procedure.   EBL:  33m   DRAINS: none   SPECIMENS: none   TJudith Part MD 01/27/21 3:11 PM

## 2021-01-27 NOTE — Transfer of Care (Addendum)
Immediate Anesthesia Transfer of Care Note  Patient: [name]  Procedure(s) Performed: Left trigeminal rhizotomy (Left)  Patient Location: PACU  Anesthesia Type:MAC  Level of Consciousness: awake, alert  and oriented  Airway & Oxygen Therapy: Patient Spontanous Breathing  Post-op Assessment: Report given to RN and Post -op Vital signs reviewed and stable  Post vital signs: Reviewed and stable  Last Vitals:  Vitals Value Taken Time  BP    Temp    Pulse 65   Resp 12   SpO2 100     Last Pain:  Vitals:   01/27/21 1235  TempSrc:   PainSc: 4      Reported to PACU RN that a magnet had been applied to pacemaker, but that it was removed once the procedure finished.     Complications: No notable events documented.

## 2021-01-28 ENCOUNTER — Encounter: Payer: Self-pay | Admitting: Nurse Practitioner

## 2021-01-29 ENCOUNTER — Other Ambulatory Visit: Payer: Self-pay

## 2021-01-29 ENCOUNTER — Ambulatory Visit (INDEPENDENT_AMBULATORY_CARE_PROVIDER_SITE_OTHER): Payer: Medicare Other | Admitting: Nurse Practitioner

## 2021-01-29 ENCOUNTER — Encounter: Payer: Self-pay | Admitting: Nurse Practitioner

## 2021-01-29 ENCOUNTER — Other Ambulatory Visit: Payer: Self-pay | Admitting: Nurse Practitioner

## 2021-01-29 VITALS — BP 110/68 | HR 63 | Temp 97.3°F | Ht 66.0 in | Wt 122.0 lb

## 2021-01-29 DIAGNOSIS — R300 Dysuria: Secondary | ICD-10-CM

## 2021-01-29 DIAGNOSIS — N400 Enlarged prostate without lower urinary tract symptoms: Secondary | ICD-10-CM

## 2021-01-29 DIAGNOSIS — L89109 Pressure ulcer of unspecified part of back, unspecified stage: Secondary | ICD-10-CM | POA: Insufficient documentation

## 2021-01-29 DIAGNOSIS — R351 Nocturia: Secondary | ICD-10-CM

## 2021-01-29 LAB — COMPREHENSIVE METABOLIC PANEL
ALT: 7 U/L (ref 0–53)
AST: 14 U/L (ref 0–37)
Albumin: 3.6 g/dL (ref 3.5–5.2)
Alkaline Phosphatase: 100 U/L (ref 39–117)
BUN: 15 mg/dL (ref 6–23)
CO2: 29 mEq/L (ref 19–32)
Calcium: 9.5 mg/dL (ref 8.4–10.5)
Chloride: 98 mEq/L (ref 96–112)
Creatinine, Ser: 1.12 mg/dL (ref 0.40–1.50)
GFR: 58.1 mL/min — ABNORMAL LOW (ref >60.00)
Glucose, Bld: 93 mg/dL (ref 70–99)
Potassium: 3.8 mEq/L (ref 3.5–5.1)
Sodium: 138 mEq/L (ref 135–145)
Total Bilirubin: 0.8 mg/dL (ref 0.2–1.2)
Total Protein: 6.6 g/dL (ref 6.0–8.3)

## 2021-01-29 LAB — CBC
HCT: 40.3 % (ref 39.0–52.0)
Hemoglobin: 13.3 g/dL (ref 13.0–17.0)
MCHC: 32.9 g/dL (ref 30.0–36.0)
MCV: 96.9 fl (ref 78.0–100.0)
Platelets: 277 10*3/uL (ref 150.0–400.0)
RBC: 4.16 Mil/uL — ABNORMAL LOW (ref 4.22–5.81)
RDW: 13.9 % (ref 11.5–15.5)
WBC: 7.7 10*3/uL (ref 4.0–10.5)

## 2021-01-29 LAB — POC URINALSYSI DIPSTICK (AUTOMATED)
Bilirubin, UA: NEGATIVE
Blood, UA: NEGATIVE
Glucose, UA: NEGATIVE
Leukocytes, UA: NEGATIVE
Nitrite, UA: NEGATIVE
Protein, UA: POSITIVE — AB
Spec Grav, UA: 1.015 (ref 1.010–1.025)
Urobilinogen, UA: 0.2 E.U./dL
pH, UA: 6.5 (ref 5.0–8.0)

## 2021-01-29 LAB — PSA: PSA: 4.84 ng/mL — ABNORMAL HIGH (ref 0.10–4.00)

## 2021-01-29 MED ORDER — MUPIROCIN CALCIUM 2 % EX CREA: 1.0000 "application " | TOPICAL_CREAM | Freq: Two times a day (BID) | CUTANEOUS | 0 refills | Status: AC

## 2021-01-29 MED ORDER — DUTASTERIDE 0.5 MG PO CAPS: 0.5000 mg | ORAL_CAPSULE | Freq: Every day | ORAL | 0 refills | Status: AC

## 2021-01-29 NOTE — Patient Instructions (Addendum)
Urine looks good in office. Can try OTC cranberry pills to help with symptoms while we await for labs I will be in touch. Come back in 4 weeks to make sure the wound is healing

## 2021-01-29 NOTE — Progress Notes (Signed)
Start patient on BPH medication

## 2021-01-29 NOTE — Assessment & Plan Note (Addendum)
Still experiencing intermittent dysuria.  Patient's UA in office today looked good.  We will still send off for culture just in case to help ladybug to possible urinary tract infection or prostatitis if found.  Pending lab work symptomatic treatment with over-the-counter treatments. Patient did recently undergo procedure in the OR.  He states he was not under full anesthesia and they did not catheterize him.  Looking back at the notes he did not seen patient was catheterized.

## 2021-01-29 NOTE — Assessment & Plan Note (Signed)
Patient has been having an increase in nocturia as of late.  I did see patient earlier this week for dysuria UA looked positive for UTI but culture came back negative.  Patient does have a history of prostatitis he states in his 77s and 46s.  Patient was nontender on exam and prostate did feel enlarged.  Pending lab work

## 2021-01-29 NOTE — Progress Notes (Addendum)
Acute Office Visit  Subjective:    Patient ID: [name], male    DOB: 1928/11/05, 87 y.o.   MRN: [id number]  Chief Complaint  Patient presents with   Dysuria   Urinary Frequency     Patient is in today for dysuria and nocturia Nocturia using about every 1.5 hours last night.  Dysuria intermittently before he gets his stream started. No difficulty starting stream. Feels like he empty's out all way. History of prostatitis in his 63s and 42s. No problem since  Past Medical History:  Diagnosis Date   Arrhythmia    Arthritis    Atrial fibrillation ([place])    Cancer [facility]) 1972   ED (erectile dysfunction)    Glaucoma    History of chicken pox    Hypertension    Melanoma ([place])    left abdomen    Past Surgical History:  Procedure Laterality Date   EP IMPLANTABLE DEVICE N/A 03/09/2015   Procedure: Pacemaker Implant;  Surgeon: Will Meredith Leeds, MD;  Location: [facility];  Service: Cardiovascular;  Laterality: N/A;   EYE SURGERY Bilateral    "about 10 years ago I had cataracts removed but it's been so long I'm not sure exactly what year"   NASAL SINUS SURGERY      Family History  Problem Relation Age of Onset   Hypertension Mother    Heart failure Mother    Heart disease Mother    Hypertension Father    Asthma Sister    Hypertension Sister    Hypertension Sister    Learning disabilities Sister     Social History   Socioeconomic History   Marital status: Widowed    Spouse name: Not on file   Number of children: 1   Years of education: 22   Highest education level: High school graduate  Occupational History   Not on file  Tobacco Use   Smoking status: Former    Packs/day: 1.00    Years: 10.00    Pack years: 10.00    Types: Cigarettes    Quit date: 1968    Years since quitting: 54.6   Smokeless tobacco: Former    Quit date: 06/06/1966   Tobacco comments:    Pt states he has never used smokeless tobacco  Vaping Use   Vaping Use: Never used   Substance and Sexual Activity   Alcohol use: Not Currently    Alcohol/week: 0.0 standard drinks   Drug use: No   Sexual activity: Never  Other Topics Concern   Not on file  Social History Narrative   Right handed   Lives alone, son is involved in care and lives closeby   Social Determinants of Health   Financial Resource Strain: Low Risk    Difficulty of Paying Living Expenses: Not hard at all  Food Insecurity: No Food Insecurity   Worried About Charity fundraiser in the Last Year: Never true   Arboriculturist in the Last Year: Never true  Transportation Needs: No Transportation Needs   Lack of Transportation (Medical): No   Lack of Transportation (Non-Medical): No  Physical Activity: Inactive   Days of Exercise per Week: 0 days   Minutes of Exercise per Session: 0 min  Stress: No Stress Concern Present   Feeling of Stress : Not at all  Social Connections: Not on file  Intimate Partner Violence: Not At Risk   Fear of Current or Ex-Partner: No   Emotionally Abused: No  Physically Abused: No   Sexually Abused: No    Outpatient Medications Prior to Visit  Medication Sig Dispense Refill   amLODipine (NORVASC) 10 MG tablet Take 1 tablet by mouth once daily 90 tablet 1   apixaban (ELIQUIS) 5 MG TABS tablet Take 1 tablet (5 mg total) by mouth 2 (two) times daily. Restart on 01/29/21 180 tablet 1   cholecalciferol (VITAMIN D) 1000 UNITS tablet Take 1,000 Units by mouth daily.     donepezil (ARICEPT) 5 MG tablet Take 1 tablet by mouth once daily 90 tablet 2   lamoTRIgine (LAMICTAL) 25 MG tablet Take 1 tablet at bedtime for 2 weeks, then 1 tablet twice daily for 2 weeks, then 2 tablets twice daily. (Patient taking differently: Take 25 mg by mouth 2 (two) times daily. Take 1 tablet at bedtime for 2 weeks, then 1 tablet twice daily for 2 weeks, then 2 tablets twice daily.) 120 tablet 0   levOCARNitine (CARNITINE PO) Take 500 mg by mouth daily.     lisinopril (ZESTRIL) 20 MG tablet  Take 1 tablet by mouth once daily 90 tablet 1   metoprolol tartrate (LOPRESSOR) 25 MG tablet Take 1 tablet by mouth twice daily 180 tablet 3   zinc gluconate 50 MG tablet Take 50 mg by mouth daily.     No facility-administered medications prior to visit.    Allergies  Allergen Reactions   Moxifloxacin Hives   Sulfa Antibiotics Rash    Review of Systems  Constitutional:  Positive for fatigue. Negative for chills and fever.  Respiratory:  Negative for shortness of breath.   Cardiovascular:  Negative for chest pain.  Gastrointestinal:  Negative for nausea and vomiting.  Genitourinary:  Positive for decreased urine volume, dysuria and frequency. Negative for difficulty urinating, hematuria, penile discharge, penile pain, penile swelling, scrotal swelling, testicular pain and urgency.       Nocturia +       Objective:    Physical Exam Vitals and nursing note reviewed. Exam conducted with a chaperone present [employer], CMA).  Constitutional:      General: He is not in acute distress.    Appearance: He is not ill-appearing or toxic-appearing.  Cardiovascular:     Rate and Rhythm: Normal rate and regular rhythm.  Genitourinary:    Prostate: Enlarged. Not tender.     Comments: Left sided enlargement  Skin:    Findings: Lesion present.     Comments: See attached photo. Noticed on exam. Looks to be stage 2 pressure injury. 1cm x 1cm. Non blanchable. Non tender  Neurological:     Mental Status: He is alert.     BP 110/68   Pulse 63   Temp (!) 97.3 F (36.3 C) (Temporal)   Ht '5\' 6"'$  (1.676 m)   Wt 122 lb (55.3 kg)   SpO2 98%   BMI 19.69 kg/m  Wt Readings from Last 3 Encounters:  01/29/21 122 lb (55.3 kg)  01/27/21 120 lb (54.4 kg)  01/25/21 124 lb (56.2 kg)   Estimated Creatinine Clearance: 29 mL/min (A) (by C-G formula based on SCr of 1.35 mg/dL (H)).  Health Maintenance Due  Topic Date Due   COVID-19 Vaccine (1) Never done   TETANUS/TDAP  09/29/2020   INFLUENZA  VACCINE  01/04/2021    There are no preventive care reminders to display for this patient.   No results found for: TSH Lab Results  Component Value Date   WBC 7.7 01/22/2021   HGB 13.6 01/22/2021  HCT 41.4 01/22/2021   MCV 97.6 01/22/2021   PLT 313 01/22/2021   Lab Results  Component Value Date   NA 139 01/22/2021   K 4.3 01/22/2021   CO2 28 01/22/2021   GLUCOSE 115 (H) 01/22/2021   BUN 23 01/22/2021   CREATININE 1.35 (H) 01/22/2021   BILITOT 0.6 09/11/2020   ALKPHOS 117 09/11/2020   AST 20 09/11/2020   ALT 19 09/11/2020   PROT 7.2 09/11/2020   ALBUMIN 4.2 09/11/2020   CALCIUM 9.5 01/22/2021   ANIONGAP 9 01/22/2021   GFR 65.17 09/11/2020   No results found for: CHOL No results found for: HDL No results found for: LDLCALC No results found for: TRIG No results found for: CHOLHDL No results found for: HGBA1C     Assessment & Plan:   Problem List Items Addressed This Visit       Musculoskeletal and Integument   Pressure injury of skin of back   Relevant Medications   mupirocin cream (BACTROBAN) 2 %   Other Relevant Orders   CBC   Comprehensive metabolic panel     Other   Dysuria - Primary    Still experiencing intermittent dysuria.  Patient's UA in office today looked good.  We will still send off for culture just in case to help ladybug to possible urinary tract infection or prostatitis if found.  Pending lab work symptomatic treatment with over-the-counter treatments.      Relevant Orders   POCT Urinalysis Dipstick (Automated) (Completed)   Urine Culture   Nocturia    Patient has been having an increase in nocturia as of late.  I did see patient earlier this week for dysuria UA looked positive for UTI but culture came back negative.  Patient does have a history of prostatitis he states in his 46s and 72s.  Patient was nontender on exam and prostate did feel enlarged.  Pending lab work      Relevant Orders   PSA     No orders of the defined types  were placed in this encounter.  This visit occurred during the SARS-CoV-2 public health emergency.  Safety protocols were in place, including screening questions prior to the visit, additional usage of staff PPE, and extensive cleaning of exam room while observing appropriate contact time as indicated for disinfecting solutions.   Romilda Garret, NP

## 2021-01-30 LAB — URINE CULTURE
MICRO NUMBER:: 12296949
Result:: NO GROWTH
SPECIMEN QUALITY:: ADEQUATE

## 2021-02-01 ENCOUNTER — Other Ambulatory Visit: Payer: Self-pay | Admitting: Nurse Practitioner

## 2021-02-01 DIAGNOSIS — N400 Enlarged prostate without lower urinary tract symptoms: Secondary | ICD-10-CM

## 2021-02-01 DIAGNOSIS — R300 Dysuria: Secondary | ICD-10-CM

## 2021-02-01 NOTE — Progress Notes (Signed)
Had risk and benefit discussion with patient about age and probability  of cancer. Did tell patient that if cancer the medication prescribed could encourage growth. Will refer to urology for final say. Patient is to hold Advodart until speaking with urologist

## 2021-02-03 ENCOUNTER — Encounter (HOSPITAL_COMMUNITY): Payer: Self-pay | Admitting: Neurological Surgery

## 2021-02-06 NOTE — Progress Notes (Signed)
Remote pacemaker transmission.   

## 2021-02-15 NOTE — Progress Notes (Signed)
02/16/21 8:57 AM   [name] 10/28/1928 [id number]  Referring provider:  Jinny Sanders, MD [address] [place],  [name] [id number] Chief Complaint  Patient presents with   Dysuria     HPI: [name] is a 87 y.o.male who presents today for further evaluation of dysuria and  enlarged prostate on rectal exam.  His urinalysis on 01/25/2021 was nitrite positive with large leukocytes.  Associated urine culture was negative.  He was treated for antibiotics.  He was seen by [name], NP, on 01/29/2021, it was noted that he had a left sided enlargement on rectal exam.  PSA was 4.84. Urine culture and urinalysis for this visit was negative. He was ordered to hold [place] until speaking with urologist.  He has a history of prostatitis that occurred in his 81s and 63s.   He states he is not doing well today. He states about a month ago he was experiencing burning during urination. He was placed on antibiotics that he reports did not relieve his symptoms.  His burning symptoms are intermittent.  He has not experienced any blood in his urine. He does state that he wakes up to urinate throughout the night. He is not bothered by his nocturia.    He has been taking AZO for his symptoms.  He reports to have lost approximately 34 lbs this year. He has not been trying to lose weight. He has had poor appetite.    IPSS     Row Name 02/16/21 0800         International Prostate Symptom Score   How often have you had the sensation of not emptying your bladder? Less than half the time     How often have you had to urinate less than every two hours? Less than half the time     How often have you found you stopped and started again several times when you urinated? Not at All     How often have you found it difficult to postpone urination? Not at All     How often have you had a weak urinary stream? Less than half the time     How often have you had to strain to start  urination? Less than 1 in 5 times     How many times did you typically get up at night to urinate? 2 Times     Total IPSS Score 9           Quality of Life due to urinary symptoms   If you were to spend the rest of your life with your urinary condition just the way it is now how would you feel about that? Mostly Satisfied              Score:  1-7 Mild 8-19 Moderate 20-35 Severe   PMH: Past Medical History:  Diagnosis Date   Arrhythmia    Arthritis    Atrial fibrillation ([place])    Cancer ([place]) 1972   ED (erectile dysfunction)    Glaucoma    History of chicken pox    Hypertension    Melanoma ([place])    left abdomen    Surgical History: Past Surgical History:  Procedure Laterality Date   EP IMPLANTABLE DEVICE N/A 03/09/2015   Procedure: Pacemaker Implant;  Surgeon: Will Meredith Leeds, MD;  Location: [facility];  Service: Cardiovascular;  Laterality: N/A;   EYE SURGERY Bilateral    "about 10 years ago I had  cataracts removed but it's been so long I'm not sure exactly what year"   NASAL SINUS SURGERY     NEUROLYSIS OF MEDIAN NERVE Left 01/27/2021   Procedure: Left trigeminal rhizotomy;  Surgeon: Judith Part, MD;  Location: [facility];  Service: Neurosurgery;  Laterality: Left;    Home Medications:  Allergies as of 02/16/2021       Reactions   Moxifloxacin Hives   Sulfa Antibiotics Rash        Medication List        Accurate as of February 16, 2021  8:57 AM. If you have any questions, ask your nurse or doctor.          amLODipine 10 MG tablet Commonly known as: NORVASC Take 1 tablet by mouth once daily   apixaban 5 MG Tabs tablet Commonly known as: Eliquis Take 1 tablet (5 mg total) by mouth 2 (two) times daily. Restart on 01/29/21   CARNITINE PO Take 500 mg by mouth daily.   cholecalciferol 1000 units tablet Commonly known as: VITAMIN D Take 1,000 Units by mouth daily.   donepezil 5 MG tablet Commonly known as: ARICEPT Take 1  tablet by mouth once daily   dutasteride 0.5 MG capsule Commonly known as: Avodart Take 1 capsule (0.5 mg total) by mouth daily.   lamoTRIgine 25 MG tablet Commonly known as: LAMICTAL Take 1 tablet at bedtime for 2 weeks, then 1 tablet twice daily for 2 weeks, then 2 tablets twice daily. What changed:  how much to take how to take this when to take this   lisinopril 20 MG tablet Commonly known as: ZESTRIL Take 1 tablet by mouth once daily   metoprolol tartrate 25 MG tablet Commonly known as: LOPRESSOR Take 1 tablet by mouth twice daily   mupirocin cream 2 % Commonly known as: Bactroban Apply 1 application topically 2 (two) times daily.   zinc gluconate 50 MG tablet Take 50 mg by mouth daily.        Allergies:  Allergies  Allergen Reactions   Moxifloxacin Hives   Sulfa Antibiotics Rash    Family History: Family History  Problem Relation Age of Onset   Hypertension Mother    Heart failure Mother    Heart disease Mother    Hypertension Father    Asthma Sister    Hypertension Sister    Hypertension Sister    Learning disabilities Sister     Social History:  reports that he quit smoking about 54 years ago. His smoking use included cigarettes. He has a 10.00 pack-year smoking history. He quit smokeless tobacco use about 54 years ago. He reports that he does not currently use alcohol. He reports that he does not use drugs.   Physical Exam: BP 117/67   Pulse 65   Ht '5\' 6"'$  (1.676 m)   Wt 125 lb (56.7 kg)   BMI 20.18 kg/m   Constitutional:  Alert and oriented, No acute distress.  Clothes are too large. HEENT: [place] AT, moist mucus membranes.  Trachea midline, no masses. Cardiovascular: No clubbing, cyanosis, or edema. Respiratory: Normal respiratory effort, no increased work of breathing. Skin: No rashes, bruises or suspicious lesions. Neurologic: Grossly intact, no focal deficits, moving all 4 extremities. Psychiatric: Normal mood and affect.  Laboratory  Data:  Lab Results  Component Value Date   CREATININE 1.12 01/29/2021    Lab Results  Component Value Date   PSA 4.84 (H) 01/29/2021    Urinalysis - Urinalysis negative today  Pertinent Imaging: Results for orders placed or performed in visit on 02/16/21  Bladder Scan (Post Void Residual) in office  Result Value Ref Range   Scan Result 5      Assessment & Plan:    Dysuria  - urine sent for cytology  - differential include bladder cancer, stones, stricture; discussed option of cystoscopy to rule out any underlying bladder malignancy. He is not sure if he wants to pursue this option, he will call if he would like to pursue.  - no concern for ongoing infection. Urine today is negative   2. BPH with nocturia -  Experiences frequency x2 a night. He is not currently bothered by this. Will not pursue further treatment  3. Elevated PSA  - appropriate for his age, would not want to treat asymptomatic localized prostate cancer  - will not pursue any further treatment / work up with which the patient is agreeable  4. Weight loss  - like due to left trigeminal neuralgia, inhibits his enjoyment of food and ability to chew - may also indicated underlying malignancy   Will call if he would like to pursue cystoscopy  I,Kailey Littlejohn,acting as a scribe for Hollice Espy, MD.,have documented all relevant documentation on the behalf of Hollice Espy, MD,as directed by  Hollice Espy, MD while in the presence of Hollice Espy, MD.  I have reviewed the above documentation for accuracy and completeness, and I agree with the above.   Hollice Espy, MD   [facility] [address], [place], [place] [address] [phone]

## 2021-02-16 ENCOUNTER — Encounter: Payer: Self-pay | Admitting: Urology

## 2021-02-16 ENCOUNTER — Other Ambulatory Visit: Payer: Self-pay

## 2021-02-16 ENCOUNTER — Ambulatory Visit: Payer: Medicare Other | Admitting: Urology

## 2021-02-16 VITALS — BP 117/67 | HR 65 | Ht 66.0 in | Wt 125.0 lb

## 2021-02-16 DIAGNOSIS — R300 Dysuria: Secondary | ICD-10-CM | POA: Diagnosis not present

## 2021-02-16 LAB — BLADDER SCAN AMB NON-IMAGING: Scan Result: 5

## 2021-02-16 NOTE — Patient Instructions (Signed)
We discussed possible cystoscopy due to his burning, if you decide to proceed let us know   Cystoscopy Cystoscopy is a procedure that is used to help diagnose and sometimes treat conditions that affect the lower urinary tract. The lower urinary tract includes the bladder and the urethra. The urethra is the tube that drains urine from the bladder. Cystoscopy is done using a thin, tube-shaped instrument with a light and camera at the end (cystoscope). The cystoscope may be hard or flexible, depending on the goal of the procedure. The cystoscope is inserted through the urethra, into the bladder. Cystoscopy may be recommended if you have: Urinary tract infections that keep coming back. Blood in the urine (hematuria). An inability to control when you urinate (urinary incontinence) or an overactive bladder. Unusual cells found in a urine sample. A blockage in the urethra, such as a urinary stone. Painful urination. An abnormality in the bladder found during an intravenous pyelogram (IVP) or CT scan. Cystoscopy may also be done to remove a sample of tissue to be examined under a microscope (biopsy). What are the risks? Generally, this is a safe procedure. However, problems may occur, including: Infection. Bleeding.  What happens during the procedure?  You will be given one or more of the following: A medicine to numb the area (local anesthetic). The area around the opening of your urethra will be cleaned. The cystoscope will be passed through your urethra into your bladder. Germ-free (sterile) fluid will flow through the cystoscope to fill your bladder. The fluid will stretch your bladder so that your health care provider can clearly examine your bladder walls. Your doctor will look at the urethra and bladder. The cystoscope will be removed The procedure may vary among health care providers  What can I expect after the procedure? After the procedure, it is common to have: Some soreness or  pain in your abdomen and urethra. Urinary symptoms. These include: Mild pain or burning when you urinate. Pain should stop within a few minutes after you urinate. This may last for up to 1 week. A small amount of blood in your urine for several days. Feeling like you need to urinate but producing only a small amount of urine. Follow these instructions at home: General instructions Return to your normal activities as told by your health care provider.  Do not drive for 24 hours if you were given a sedative during your procedure. Watch for any blood in your urine. If the amount of blood in your urine increases, call your health care provider. If a tissue sample was removed for testing (biopsy) during your procedure, it is up to you to get your test results. Ask your health care provider, or the department that is doing the test, when your results will be ready. Drink enough fluid to keep your urine pale yellow. Keep all follow-up visits as told by your health care provider. This is important. Contact a health care provider if you: Have pain that gets worse or does not get better with medicine, especially pain when you urinate. Have trouble urinating. Have more blood in your urine. Get help right away if you: Have blood clots in your urine. Have abdominal pain. Have a fever or chills. Are unable to urinate. Summary Cystoscopy is a procedure that is used to help diagnose and sometimes treat conditions that affect the lower urinary tract. Cystoscopy is done using a thin, tube-shaped instrument with a light and camera at the end. After the procedure, it is common  to have some soreness or pain in your abdomen and urethra. Watch for any blood in your urine. If the amount of blood in your urine increases, call your health care provider. If you were prescribed an antibiotic medicine, take it as told by your health care provider. Do not stop taking the antibiotic even if you start to feel  better. This information is not intended to replace advice given to you by your health care provider. Make sure you discuss any questions you have with your health care provider. Document Revised: 05/15/2018 Document Reviewed: 05/15/2018 Elsevier Patient Education  [place].

## 2021-02-17 ENCOUNTER — Emergency Department
Admission: EM | Admit: 2021-02-17 | Discharge: 2021-02-17 | Disposition: A | Discharge status: Home / Self Care | Payer: Medicare Other | Attending: Emergency Medicine | Admitting: Emergency Medicine

## 2021-02-17 ENCOUNTER — Emergency Department: Payer: Medicare Other

## 2021-02-17 ENCOUNTER — Other Ambulatory Visit: Payer: Self-pay

## 2021-02-17 ENCOUNTER — Encounter: Payer: Self-pay | Admitting: Emergency Medicine

## 2021-02-17 DIAGNOSIS — Z79899 Other long term (current) drug therapy: Secondary | ICD-10-CM | POA: Diagnosis not present

## 2021-02-17 DIAGNOSIS — K5900 Constipation, unspecified: Secondary | ICD-10-CM | POA: Diagnosis not present

## 2021-02-17 DIAGNOSIS — Z87891 Personal history of nicotine dependence: Secondary | ICD-10-CM | POA: Insufficient documentation

## 2021-02-17 DIAGNOSIS — Z8679 Personal history of other diseases of the circulatory system: Secondary | ICD-10-CM | POA: Diagnosis not present

## 2021-02-17 DIAGNOSIS — K5903 Drug induced constipation: Secondary | ICD-10-CM | POA: Diagnosis not present

## 2021-02-17 DIAGNOSIS — I10 Essential (primary) hypertension: Secondary | ICD-10-CM | POA: Insufficient documentation

## 2021-02-17 LAB — URINALYSIS, COMPLETE
Bilirubin, UA: NEGATIVE
Glucose, UA: NEGATIVE
Ketones, UA: NEGATIVE
Leukocytes,UA: NEGATIVE
Nitrite, UA: NEGATIVE
RBC, UA: NEGATIVE
Specific Gravity, UA: 1.025 (ref 1.005–1.030)
Urobilinogen, Ur: 0.2 mg/dL (ref 0.2–1.0)
pH, UA: 7 (ref 5.0–7.5)

## 2021-02-17 LAB — MICROSCOPIC EXAMINATION: Bacteria, UA: NONE SEEN

## 2021-02-17 LAB — CYTOLOGY - NON PAP

## 2021-02-17 MED ORDER — BISACODYL 5 MG PO TBEC: 5.0000 mg | DELAYED_RELEASE_TABLET | Freq: Every day | ORAL | 0 refills | Status: AC | PRN

## 2021-02-17 MED ORDER — MINERAL OIL RE ENEM
1.0000 | ENEMA | Freq: Once | RECTAL | Status: AC
  Administered 2021-02-17: 1 via RECTAL

## 2021-02-17 MED ORDER — BISACODYL 5 MG PO TBEC
5.0000 mg | DELAYED_RELEASE_TABLET | Freq: Once | ORAL | Status: AC
  Administered 2021-02-17: 5 mg via ORAL
  Filled 2021-02-17: qty 1

## 2021-02-17 MED ORDER — POLYETHYLENE GLYCOL 3350 17 GM/SCOOP PO POWD: ORAL | 0 refills | Status: AC

## 2021-02-17 NOTE — ED Notes (Signed)
See triage note  presents with some constipation  states last BM was about 3 days ago  no n/v  no relief with OTC meds

## 2021-02-17 NOTE — ED Triage Notes (Signed)
Pt comes into the ED via POV c/o constipation with the last BM being 3-4 days ago.  Pt states he has attempted a home enema with no success except some superficial bleeding. Pt in NAD.

## 2021-02-17 NOTE — Discharge Instructions (Addendum)
Drink plenty of water.  Use the Dulcolax tonight.  Use a half a scoop of MiraLAX tonight.  If you have a normal bowel movement tomorrow then use a half a scoop of MiraLAX daily to keep your stools soft.  Return if worsening

## 2021-02-17 NOTE — ED Provider Notes (Signed)
[facility] Emergency Department Provider Note  ____________________________________________   Event Date/Time   First MD Initiated Contact with Patient 02/17/21 [phone]     (approximate)  I have reviewed the triage vital signs and the nursing notes.   HISTORY  Chief Complaint Constipation    HPI [name] is a 87 y.o. male presents emergency department with constipation for 3 days.  No nausea or vomiting.  No loose stools coming out around the hard stool.  Patient states he attempted to do a enema but did not do it correctly as he could not reach around behind him.  Also took milk of magnesia without any relief.  States he takes a lot of medicine and has some nerve damage in his face.  Feels like he is not chewing his food up properly.  He denies any fever or chills.  Past Medical History:  Diagnosis Date   Arrhythmia    Arthritis    Atrial fibrillation ([place])    Cancer [facility]) 1972   ED (erectile dysfunction)    Glaucoma    History of chicken pox    Hypertension    Melanoma ([place])    left abdomen    Patient Active Problem List   Diagnosis Date Noted   Pressure injury of skin of back 01/29/2021   Nocturia 01/29/2021   Urinary tract infection without hematuria 01/25/2021   Dysuria 01/25/2021   Trigeminal neuralgia pain 07/07/2020   Erectile dysfunction 11/01/2019   Chronic left ear pain 10/18/2019   Chronic jaw pain 10/18/2019   BPH associated with nocturia 09/17/2019   Osteoarthritis of lower back 07/11/2019   Hearing loss 07/11/2019   Mild dementia ([place]) 07/11/2019   Paroxysmal atrial fibrillation ([place]) 10/24/2016   Essential hypertension 10/24/2016   History of melanoma 10/24/2016   Heart block, AV 03/05/2015    Past Surgical History:  Procedure Laterality Date   EP IMPLANTABLE DEVICE N/A 03/09/2015   Procedure: Pacemaker Implant;  Surgeon: Will Meredith Leeds, MD;  Location: [facility];  Service: Cardiovascular;  Laterality:  N/A;   EYE SURGERY Bilateral    "about 10 years ago I had cataracts removed but it's been so long I'm not sure exactly what year"   NASAL SINUS SURGERY     NEUROLYSIS OF MEDIAN NERVE Left 01/27/2021   Procedure: Left trigeminal rhizotomy;  Surgeon: Judith Part, MD;  Location: [facility];  Service: Neurosurgery;  Laterality: Left;    Prior to Admission medications   Medication Sig Start Date End Date Taking? Authorizing Provider  bisacodyl (DULCOLAX) 5 MG EC tablet Take 1 tablet (5 mg total) by mouth daily as needed for moderate constipation. 02/17/21  Yes Kateryn Marasigan, Linden Dolin, PA-C  polyethylene glycol powder [facility]) 17 GM/SCOOP powder 1/2 scoop daily as stool softener 02/17/21  Yes Alice Vitelli, Linden Dolin, PA-C  amLODipine (NORVASC) 10 MG tablet Take 1 tablet by mouth once daily 10/21/20   Bedsole, Amy E, MD  apixaban (ELIQUIS) 5 MG TABS tablet Take 1 tablet (5 mg total) by mouth 2 (two) times daily. Restart on 01/29/21 01/27/21   Judith Part, MD  cholecalciferol (VITAMIN D) 1000 UNITS tablet Take 1,000 Units by mouth daily.    [provider]  donepezil (ARICEPT) 5 MG tablet Take 1 tablet by mouth once daily 01/10/21   Bedsole, Amy E, MD  dutasteride (AVODART) 0.5 MG capsule Take 1 capsule (0.5 mg total) by mouth daily. 01/29/21   Michela Pitcher, NP  lamoTRIgine (LAMICTAL)  25 MG tablet Take 1 tablet at bedtime for 2 weeks, then 1 tablet twice daily for 2 weeks, then 2 tablets twice daily. Patient taking differently: Take 25 mg by mouth 2 (two) times daily. Take 1 tablet at bedtime for 2 weeks, then 1 tablet twice daily for 2 weeks, then 2 tablets twice daily. 12/03/20   Melvenia Beam, MD  levOCARNitine (CARNITINE PO) Take 500 mg by mouth daily.    [provider]  lisinopril (ZESTRIL) 20 MG tablet Take 1 tablet by mouth once daily 10/21/20   Diona Browner, Amy E, MD  metoprolol tartrate (LOPRESSOR) 25 MG tablet Take 1 tablet by mouth twice daily 07/17/20   Camnitz, Ocie Doyne, MD   mupirocin cream (BACTROBAN) 2 % Apply 1 application topically 2 (two) times daily. 01/29/21   Michela Pitcher, NP  zinc gluconate 50 MG tablet Take 50 mg by mouth daily.    [provider]    Allergies Moxifloxacin and Sulfa antibiotics  Family History  Problem Relation Age of Onset   Hypertension Mother    Heart failure Mother    Heart disease Mother    Hypertension Father    Asthma Sister    Hypertension Sister    Hypertension Sister    Learning disabilities Sister     Social History Social History   Tobacco Use   Smoking status: Former    Packs/day: 1.00    Years: 10.00    Pack years: 10.00    Types: Cigarettes    Quit date: 1968    Years since quitting: 54.7   Smokeless tobacco: Former    Quit date: 06/06/1966   Tobacco comments:    Pt states he has never used smokeless tobacco  Vaping Use   Vaping Use: Never used  Substance Use Topics   Alcohol use: Not Currently    Alcohol/week: 0.0 standard drinks   Drug use: No    Review of Systems  Constitutional: No fever/chills Eyes: No visual changes. ENT: No sore throat. Respiratory: Denies cough Cardiovascular: Denies chest pain Gastrointestinal: Denies abdominal pain, positive constipation Genitourinary: Negative for dysuria. Musculoskeletal: Negative for back pain. Skin: Negative for rash. Psychiatric: no mood changes,     ____________________________________________   PHYSICAL EXAM:  VITAL SIGNS: ED Triage Vitals  Enc Vitals Group     BP 02/17/21 0929 112/63     Pulse Rate 02/17/21 0929 96     Resp 02/17/21 0929 18     Temp 02/17/21 0929 98.7 F (37.1 C)     Temp Source 02/17/21 0929 Oral     SpO2 02/17/21 0929 98 %     Weight 02/17/21 0926 125 lb (56.7 kg)     Height 02/17/21 0926 '5\' 6"'$  (1.676 m)     Head Circumference --      Peak Flow --      Pain Score 02/17/21 0926 0     Pain Loc --      Pain Edu? --      Excl. in [place]? --     Constitutional: Alert and oriented. Well  appearing and in no acute distress. Eyes: Conjunctivae are normal.  Head: Atraumatic. Nose: No congestion/rhinnorhea. Mouth/Throat: Mucous membranes are moist.   Neck:  supple no lymphadenopathy noted Cardiovascular: Normal rate, regular rhythm. Heart sounds are normal Respiratory: Normal respiratory effort.  No retractions, lungs c t a  Abd: soft nontender bs normal all 4 quad GU: deferred Musculoskeletal: FROM all extremities, warm and well perfused Neurologic:  Normal speech and language.  Skin:  Skin is warm, dry and intact. No rash noted. Psychiatric: Mood and affect are normal. Speech and behavior are normal.  ____________________________________________   LABS (all labs ordered are listed, but only abnormal results are displayed)  Labs Reviewed - No data to display ____________________________________________   ____________________________________________  RADIOLOGY  Abdomen 1 view  ____________________________________________   PROCEDURES  Procedure(s) performed: Fleets enema, Dulcolax 1 PM   Procedures    ____________________________________________   INITIAL IMPRESSION / ASSESSMENT AND PLAN / ED COURSE  Pertinent labs & imaging results that were available during my care of the patient were reviewed by me and considered in my medical decision making (see chart for details).   Patient is an 87 year old male presents to the emergency department constipation for 3 days.  See HPI.  Physical exam shows patient be stable.  Does have bowel sounds.  Abdomen is nontender  Abdomen 1 view  X-ray of the abdomen 1 view reviewed by me confirmed by radiology to have a normal gas pattern and does not have a large stool burden.  No stool ball noted on exam  Fleets enema and Dulcolax 1 p.o.  The patient did have a small bowel movement following the enema.  She will be discharged with a prescription for Dulcolax and MiraLAX.  He is to take 1 Dulcolax tonight and one half  scoop of MiraLAX.  He has a normal bowel movement tomorrow then he can use MiraLAX half scoop daily to keep his stools soft.  Return emergency department if worsening.  He was discharged stable condition with constipation instructions     [name] was evaluated in Emergency Department on 02/17/2021 for the symptoms described in the history of present illness. He was evaluated in the context of the global COVID-19 pandemic, which necessitated consideration that the patient might be at risk for infection with the SARS-CoV-2 virus that causes COVID-19. Institutional protocols and algorithms that pertain to the evaluation of patients at risk for COVID-19 are in a state of rapid change based on information released by regulatory bodies including the CDC and federal and state organizations. These policies and algorithms were followed during the patient's care in the ED.    As part of my medical decision making, I reviewed the following data within the [place] notes reviewed and incorporated, Old chart reviewed, Radiograph reviewed , Notes from prior ED visits, and [place] Controlled Substance Database  ____________________________________________   FINAL CLINICAL IMPRESSION(S) / ED DIAGNOSES  Final diagnoses:  Constipation, unspecified constipation type      NEW MEDICATIONS STARTED DURING THIS VISIT:  New Prescriptions   BISACODYL (DULCOLAX) 5 MG EC TABLET    Take 1 tablet (5 mg total) by mouth daily as needed for moderate constipation.   POLYETHYLENE GLYCOL POWDER (GLYCOLAX/MIRALAX) 17 GM/SCOOP POWDER    1/2 scoop daily as stool softener     Note:  This document was prepared using Dragon voice recognition software and may include unintentional dictation errors.    Versie Starks, PA-C 02/17/21 1157    Nena Polio, MD 02/17/21 [phone]

## 2021-02-17 NOTE — ED Notes (Addendum)
Patient placed in hospital gown and non skid socks and laying on left side post enema. Bedside commode at bedside. Patient instructed to hold liquid in as long as possible and to press call light when ready to have a bowel movement. Call light in reach. Fall precautions in place.

## 2021-02-17 NOTE — ED Notes (Signed)
Patient assisted to bedside commode and encourage to try and have a bowel movement. Call light in reach. Fall precautions in place.

## 2021-02-26 ENCOUNTER — Encounter: Payer: Self-pay | Admitting: Cardiology

## 2021-02-26 NOTE — Progress Notes (Signed)
Sent email to [facility] requesting device orders. Left message for Jerilynn Mages rep, notifying him of pt's name, DOS,time of surgery and arrival time.

## 2021-02-26 NOTE — Progress Notes (Signed)
Surgical Instructions    Your procedure is scheduled on Wednesday September 28.  Report to [facility] Main Entrance "A" at 12:30 P.M., then check in with the Admitting office.  Call this number if you have problems the morning of surgery:  [phone]   If you have any questions prior to your surgery date call [phone]: Open Monday-Friday 8am-4pm    Remember:  Do not eat after midnight the night before your surgery  You may drink clear liquids until 11:30am the morning of your surgery.   Clear liquids allowed are: Water, Non-Citrus Juices (without pulp), Carbonated Beverages, Clear Tea, Black Coffee ONLY (NO MILK, CREAM OR POWDERED CREAMER of any kind), and Gatorade    Take these medicines the morning of surgery with A SIP OF WATER : acetaminophen (TYLENOL)  amLODipine (NORVASC)  donepezil (ARICEPT metoprolol tartrate (LOPRESSOR)   As of today, STOP taking any Aspirin (unless otherwise instructed by your surgeon) Aleve, Naproxen, Ibuprofen, Motrin, Advil, Goody's, BC's, all herbal medications, fish oil, and all vitamins.          Do not wear jewelry or makeup Do not wear lotions, powders, perfumes/colognes, or deodorant. Do not shave 48 hours prior to surgery.  Men may shave face and neck. Do not bring valuables to the hospital. DO Not wear nail polish, gel polish, artificial nails, or any other type of covering on natural nails including finger and toenails. If patients have artificial nails, gel coating, etc. that need to be removed by a nail salon please have this removed prior to surgery or surgery may need to be canceled/delayed if the surgeon/ anesthesia feels like the patient is unable to be adequately monitored.             [facility] is not responsible for any belongings or valuables.  Do NOT Smoke (Tobacco/Vaping)  24 hours prior to your procedure If you use a CPAP at night, you may bring your mask for your overnight stay.   Contacts, glasses, dentures or  bridgework may not be worn into surgery, please bring cases for these belongings   For patients admitted to the hospital, discharge time will be determined by your treatment team.   Patients discharged the day of surgery will not be allowed to drive home, and someone needs to stay with them for 24 hours.  NO VISITORS WILL BE ALLOWED IN PRE-OP WHERE PATIENTS GET READY FOR SURGERY.  ONLY 1 SUPPORT PERSON MAY BE PRESENT WHILE YOU ARE IN SURGERY.  IF YOU ARE TO BE ADMITTED, ONCE YOU ARE IN YOUR ROOM YOU WILL BE ALLOWED TWO (2) VISITORS.  Minor children may have two parents present. Special consideration for safety and communication needs will be reviewed on a case by case basis.  Special instructions:    Oral Hygiene is also important to reduce your risk of infection.  Remember - BRUSH YOUR TEETH THE MORNING OF SURGERY WITH YOUR REGULAR TOOTHPASTE   [place]- Preparing For Surgery  Before surgery, you can play an important role. Because skin is not sterile, your skin needs to be as free of germs as possible. You can reduce the number of germs on your skin by washing with CHG (chlorahexidine gluconate) Soap before surgery.  CHG is an antiseptic cleaner which kills germs and bonds with the skin to continue killing germs even after washing.     Please do not use if you have an allergy to CHG or antibacterial soaps. If your skin becomes reddened/irritated stop using the  CHG.  Do not shave (including legs and underarms) for at least 48 hours prior to first CHG shower. It is OK to shave your face.  Please follow these instructions carefully.     Shower the NIGHT BEFORE SURGERY and the MORNING OF SURGERY with CHG Soap.   If you chose to wash your hair, wash your hair first as usual with your normal shampoo. After you shampoo, rinse your hair and body thoroughly to remove the shampoo.  Then [employer] and genitals (private parts) with your normal soap and rinse thoroughly to remove soap.  After that  Use CHG Soap as you would any other liquid soap. You can apply CHG directly to the skin and wash gently with a scrungie or a clean washcloth.   Apply the CHG Soap to your body ONLY FROM THE NECK DOWN.  Do not use on open wounds or open sores. Avoid contact with your eyes, ears, mouth and genitals (private parts). Wash Face and genitals (private parts)  with your normal soap.   Wash thoroughly, paying special attention to the area where your surgery will be performed.  Thoroughly rinse your body with warm water from the neck down.  DO NOT shower/wash with your normal soap after using and rinsing off the CHG Soap.  Pat yourself dry with a CLEAN TOWEL.  Wear CLEAN PAJAMAS to bed the night before surgery  Place CLEAN SHEETS on your bed the night before your surgery  DO NOT SLEEP WITH PETS.   Day of Surgery:  Take a shower with CHG soap. Wear Clean/Comfortable clothing the morning of surgery Do not apply any deodorants/lotions.   Remember to brush your teeth WITH YOUR REGULAR TOOTHPASTE.   Please read over the following fact sheets that you were given.

## 2021-02-26 NOTE — Progress Notes (Signed)
[facility] DEVICE PROGRAMMING  Patient Information: Name:  [name]  DOB:  12-Dec-1928  MRN:  [id number]    Planned Procedure:  Left trigeminal balloon rhizotomy  Surgeon:  Emelda Brothers MD  Date of Procedure:  03/03/21  Cautery will be used.  Position during surgery:  supine   Please send documentation back to:  [facility] (Fax # [phone])   Device Information:  Clinic EP Physician:  Allegra Lai, MD   Device Type:  Pacemaker Manufacturer and Phone #:  [employer]: [phone] Pacemaker Dependent?:  Yes.   Date of Last Device Check:  01/18/21 (Remote) Normal Device Function?:  No.  Electrophysiologist's Recommendations:  Have magnet available. Provide continuous ECG monitoring when magnet is used or reprogramming is to be performed.  Procedure may interfere with device function.  Magnet should be placed over device during procedure.  Per Device Clinic Standing Orders, Simone Curia, RN  4:57 PM 02/26/2021

## 2021-03-01 ENCOUNTER — Other Ambulatory Visit: Payer: Self-pay

## 2021-03-01 ENCOUNTER — Encounter (HOSPITAL_COMMUNITY): Payer: Self-pay

## 2021-03-01 ENCOUNTER — Encounter (HOSPITAL_COMMUNITY)
Admission: RE | Admit: 2021-03-01 | Discharge: 2021-03-01 | Disposition: A | Discharge status: Home / Self Care | Payer: Medicare Other | Source: Ambulatory Visit | Attending: Neurological Surgery | Admitting: Neurological Surgery

## 2021-03-01 DIAGNOSIS — Z01812 Encounter for preprocedural laboratory examination: Secondary | ICD-10-CM | POA: Diagnosis not present

## 2021-03-01 DIAGNOSIS — Z20822 Contact with and (suspected) exposure to covid-19: Secondary | ICD-10-CM | POA: Diagnosis not present

## 2021-03-01 DIAGNOSIS — Z950 Presence of cardiac pacemaker: Secondary | ICD-10-CM | POA: Insufficient documentation

## 2021-03-01 DIAGNOSIS — I441 Atrioventricular block, second degree: Secondary | ICD-10-CM | POA: Insufficient documentation

## 2021-03-01 DIAGNOSIS — Z7901 Long term (current) use of anticoagulants: Secondary | ICD-10-CM | POA: Insufficient documentation

## 2021-03-01 HISTORY — DX: Presence of cardiac pacemaker: Z95.0

## 2021-03-01 HISTORY — DX: Cardiac arrhythmia, unspecified: I49.9

## 2021-03-01 LAB — BASIC METABOLIC PANEL
Anion gap: 8 (ref 5–15)
BUN: 25 mg/dL — ABNORMAL HIGH (ref 8–23)
CO2: 30 mmol/L (ref 22–32)
Calcium: 9.3 mg/dL (ref 8.9–10.3)
Chloride: 101 mmol/L (ref 98–111)
Creatinine, Ser: 1.11 mg/dL (ref 0.61–1.24)
GFR, Estimated: >60 mL/min (ref >60)
Glucose, Bld: 102 mg/dL — ABNORMAL HIGH (ref 70–99)
Potassium: 4.2 mmol/L (ref 3.5–5.1)
Sodium: 139 mmol/L (ref 135–145)

## 2021-03-01 LAB — CBC
HCT: 40.3 % (ref 39.0–52.0)
Hemoglobin: 13.1 g/dL (ref 13.0–17.0)
MCH: 32.2 pg (ref 26.0–34.0)
MCHC: 32.5 g/dL (ref 30.0–36.0)
MCV: 99 fL (ref 80.0–100.0)
Platelets: 308 10*3/uL (ref 150–400)
RBC: 4.07 MIL/uL — ABNORMAL LOW (ref 4.22–5.81)
RDW: 14.4 % (ref 11.5–15.5)
WBC: 6.3 10*3/uL (ref 4.0–10.5)
nRBC: 0 % (ref 0.0–0.2)

## 2021-03-01 LAB — SARS CORONAVIRUS 2 (TAT 6-24 HRS): SARS Coronavirus 2: NEGATIVE

## 2021-03-01 NOTE — Progress Notes (Signed)
Mickel Baas with [employer] called and was given the Electrophysiologist's Recommendations for surgery. Mickel Baas made aware of date,time, and location of patient's surgery and verbalized understanding.

## 2021-03-01 NOTE — Progress Notes (Signed)
PCP - Dr. Diona Browner Cardiologist - Dr. Curt Bears  PPM/ICD - Pacemaker Device Orders - Requested and Received  Electrophysiologist's Recommendations:   Have magnet available. Provide continuous ECG monitoring when magnet is used or reprogramming is to be performed.  Procedure may interfere with device function.  Magnet should be placed over device during procedure. Rep Notified - Yes. Mickel Baas with [employer]. Stated a rep will arrive by 2:00 pm on day of surgery.    Chest x-ray - n/a EKG - 04/21/20 Stress Test - denies ECHO - 11/13/19 Cardiac Cath - denies  Sleep Study - denies CPAP - n/a  Fasting Blood Sugar - n/a Checks Blood Sugar _ n/a _ times a day  Blood Thinner Instructions: Patient states he was instructed to take last dose of Eliquis on Saturday 02/27/21. Aspirin Instructions:  ERAS Protcol - PRE-SURGERY Ensure or G2-   COVID TEST- 03/01/21. Pending   Anesthesia review: Yes  Patient denies shortness of breath, fever, cough and chest pain at PAT appointment   All instructions explained to the patient, with a verbal understanding of the material. Patient agrees to go over the instructions while at home for a better understanding. Patient also instructed to self quarantine after being tested for COVID-19. The opportunity to ask questions was provided.

## 2021-03-01 NOTE — Progress Notes (Signed)
Surgical Instructions    Your procedure is scheduled on Wednesday September 28.  Report to [facility] Main Entrance "A" at 12:30 P.M., then check in with the Admitting office.  Call this number if you have problems the morning of surgery:  [phone]   If you have any questions prior to your surgery date call [phone]: Open Monday-Friday 8am-4pm    Remember:  Do not eat after midnight the night before your surgery  You may drink clear liquids until 11:30am the morning of your surgery.   Clear liquids allowed are: Water, Non-Citrus Juices (without pulp), Carbonated Beverages, Clear Tea, Black Coffee ONLY (NO MILK, CREAM OR POWDERED CREAMER of any kind), and Gatorade    Take these medicines the morning of surgery with A SIP OF WATER : acetaminophen (TYLENOL)  amLODipine (NORVASC)  donepezil (ARICEPT metoprolol tartrate (LOPRESSOR)   Please follow your surgeon's instructions regarding apixaban (ELIQUIS). If you have not received instructions then you need to contact your surgeon's office for instructions.   As of today, STOP taking any Aspirin (unless otherwise instructed by your surgeon) Aleve, Naproxen, Ibuprofen, Motrin, Advil, Goody's, BC's, all herbal medications, fish oil, and all vitamins.          Do not wear jewelry or makeup Do not wear lotions, powders, perfumes/colognes, or deodorant. Do not shave 48 hours prior to surgery.  Men may shave face and neck. Do not bring valuables to the hospital. DO Not wear nail polish, gel polish, artificial nails, or any other type of covering on natural nails including finger and toenails. If patients have artificial nails, gel coating, etc. that need to be removed by a nail salon please have this removed prior to surgery or surgery may need to be canceled/delayed if the surgeon/ anesthesia feels like the patient is unable to be adequately monitored.             [facility] is not responsible for any belongings or valuables.  Do NOT  Smoke (Tobacco/Vaping)  24 hours prior to your procedure If you use a CPAP at night, you may bring your mask for your overnight stay.   Contacts, glasses, dentures or bridgework may not be worn into surgery, please bring cases for these belongings   For patients admitted to the hospital, discharge time will be determined by your treatment team.   Patients discharged the day of surgery will not be allowed to drive home, and someone needs to stay with them for 24 hours.  NO VISITORS WILL BE ALLOWED IN PRE-OP WHERE PATIENTS GET READY FOR SURGERY.  ONLY 1 SUPPORT PERSON MAY BE PRESENT WHILE YOU ARE IN SURGERY.  IF YOU ARE TO BE ADMITTED, ONCE YOU ARE IN YOUR ROOM YOU WILL BE ALLOWED TWO (2) VISITORS.  Minor children may have two parents present. Special consideration for safety and communication needs will be reviewed on a case by case basis.  Special instructions:    Oral Hygiene is also important to reduce your risk of infection.  Remember - BRUSH YOUR TEETH THE MORNING OF SURGERY WITH YOUR REGULAR TOOTHPASTE   [place]- Preparing For Surgery  Before surgery, you can play an important role. Because skin is not sterile, your skin needs to be as free of germs as possible. You can reduce the number of germs on your skin by washing with CHG (chlorahexidine gluconate) Soap before surgery.  CHG is an antiseptic cleaner which kills germs and bonds with the skin to continue killing germs even after washing.  Please do not use if you have an allergy to CHG or antibacterial soaps. If your skin becomes reddened/irritated stop using the CHG.  Do not shave (including legs and underarms) for at least 48 hours prior to first CHG shower. It is OK to shave your face.  Please follow these instructions carefully.     Shower the NIGHT BEFORE SURGERY and the MORNING OF SURGERY with CHG Soap.   If you chose to wash your hair, wash your hair first as usual with your normal shampoo. After you shampoo, rinse  your hair and body thoroughly to remove the shampoo.  Then [employer] and genitals (private parts) with your normal soap and rinse thoroughly to remove soap.  After that Use CHG Soap as you would any other liquid soap. You can apply CHG directly to the skin and wash gently with a scrungie or a clean washcloth.   Apply the CHG Soap to your body ONLY FROM THE NECK DOWN.  Do not use on open wounds or open sores. Avoid contact with your eyes, ears, mouth and genitals (private parts). Wash Face and genitals (private parts)  with your normal soap.   Wash thoroughly, paying special attention to the area where your surgery will be performed.  Thoroughly rinse your body with warm water from the neck down.  DO NOT shower/wash with your normal soap after using and rinsing off the CHG Soap.  Pat yourself dry with a CLEAN TOWEL.  Wear CLEAN PAJAMAS to bed the night before surgery  Place CLEAN SHEETS on your bed the night before your surgery  DO NOT SLEEP WITH PETS.   Day of Surgery:  Take a shower with CHG soap. Wear Clean/Comfortable clothing the morning of surgery Do not apply any deodorants/lotions.   Remember to brush your teeth WITH YOUR REGULAR TOOTHPASTE.   Please read over the following fact sheets that you were given.

## 2021-03-02 NOTE — Progress Notes (Addendum)
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  Preop clearance per telephone encounter 01/21/2021, "Chart reviewed as part of pre-operative protocol coverage. Patient was contacted 01/21/2021 in reference to pre-operative risk assessment for pending surgery as outlined below.  [name] was last seen on 04/21/20 by Dr. Curt [name].  Since that day, [name] has done well.  He can complete 4.0 METS without angina (stairs several times daily).  Per our clinical pharmacist: Per office protocol, patient can hold Eliquis for 3 days prior to procedure. Therefore, based on ACC/AHA guidelines, the patient would be at acceptable risk for the planned procedure without further cardiovascular testing."   Pt reported LD Eliquis 02/27/21.   Recently underwent left trigeminal rhizotomy 1/57/26 without complication.    Preop labs reviewed, unremarkable.   EKG 04/21/2020: A sensed, V paced rhythm.   Perioperative prescription for implanted cardiac device programming per progress note 01/22/2021: Device Information:   Clinic EP Physician:  Geoffrey Lai, MD    Device Type:  Pacemaker Manufacturer and Phone #:  [employer]: [phone] Pacemaker Dependent?:  Yes.   Date of Last Device Check:  01/18/21 (Remote)       Normal Device Function?:  No.   Electrophysiologist's Recommendations:   Have magnet available. Provide continuous ECG monitoring when magnet is used or reprogramming is to be performed.  Procedure may interfere with device function.  Magnet should be placed over device during procedure.   TTE 11/13/2019:  1. There is hypokinesis of the mid to distal anteroseptal wall (may be  pacer related) with septal bounce. . Left ventricular ejection fraction,  by estimation, is 55 to 60%. The left ventricle has normal function. The  left ventricle demonstrates regional   wall motion abnormalities (see  scoring diagram/findings for description).  Left ventricular diastolic parameters are consistent with Grade I  diastolic dysfunction (impaired relaxation).   2. Right ventricular systolic function is normal. The right ventricular  size is normal. There is mildly elevated pulmonary artery systolic  pressure. The estimated right ventricular systolic pressure is 84.5 mmHg.   3. The mitral valve is normal in structure. Mild mitral valve  regurgitation. No evidence of mitral stenosis.   4. The aortic valve is normal in structure. Aortic valve regurgitation is  not visualized. Mild aortic valve sclerosis is present, with no evidence  of aortic valve stenosis.   5. Aortic dilatation noted. There is mild dilatation of the ascending  aorta measuring 39 mm.   6. The inferior vena cava is normal in size with greater than 50%  respiratory variability, suggesting right atrial pressure of 3 mmHg.    [name]/Anesthesiology Phone [phone] 03/02/2021 1:17 PM

## 2021-03-02 NOTE — Anesthesia Preprocedure Evaluation (Addendum)
Anesthesia Evaluation  Patient identified by MRN, date of birth, ID band Patient awake    Reviewed: Allergy & Precautions, NPO status , Patient's Chart, lab work & pertinent test results  Airway Mallampati: IV  TM Distance: <3 FB Neck ROM: Limited  Mouth opening: Limited Mouth Opening  Dental no notable dental hx. (+) Poor Dentition, Dental Advisory Given, Missing, Chipped   Pulmonary neg pulmonary ROS, former smoker,    Pulmonary exam normal breath sounds clear to auscultation       Cardiovascular hypertension, Pt. on medications and Pt. on home beta blockers Normal cardiovascular exam+ dysrhythmias Atrial Fibrillation + pacemaker  Rhythm:Regular Rate:Normal  Echo    Remote pacer interrogation 01/18/2021 Scheduled remote reviewed. Normal device function.   23 AMS, all <22sec.  Next remote 91 days.  LR  Electrophysiologist's Recommendations: Have magnet available. Provide continuous ECG monitoring when magnet is used or reprogramming is to be performed. Procedure may interfere with device function. Magnet should be placed over device during procedure.   Neuro/Psych PSYCHIATRIC DISORDERS Dementia    GI/Hepatic negative GI ROS, Neg liver ROS,   Endo/Other  negative endocrine ROS  Renal/GU negative Renal ROS     Musculoskeletal  (+) Arthritis ,   Abdominal   Peds  Hematology negative hematology ROS (+)   Anesthesia Other Findings   Reproductive/Obstetrics                                                           Anesthesia Evaluation  Patient identified by MRN, date of birth, ID band Patient awake    Reviewed: Allergy & Precautions, NPO status , Patient's Chart, lab work & pertinent test results, reviewed documented beta blocker date and time   History of Anesthesia Complications Negative for: history of anesthetic complications  Airway Mallampati: IV  TM Distance: >3 FB Neck  ROM: Full  Mouth opening: Limited Mouth Opening  Dental  (+) Missing, Poor Dentition, Dental Advisory Given   Pulmonary former smoker,  01/27/2021 SARS coronavirus NEG   breath sounds clear to auscultation       Cardiovascular hypertension, Pt. on medications and Pt. on home beta blockers + dysrhythmias Atrial Fibrillation + pacemaker (for heart block)  Rhythm:Regular Rate:Normal  '21 ECHO: There is hypokinesis of the mid to distal anteroseptal wall (may be pacer related) with septal bounce. EF 55-60%. The left ventricle has normal function, mild MR   Neuro/Psych Trigeminal neuralgia glaucoma    GI/Hepatic negative GI ROS, Neg liver ROS,   Endo/Other  negative endocrine ROS  Renal/GU negative Renal ROS     Musculoskeletal  (+) Arthritis ,   Abdominal   Peds  Hematology eliquis   Anesthesia Other Findings   Reproductive/Obstetrics                           Anesthesia Physical Anesthesia Plan  ASA: 3  Anesthesia Plan: MAC   Post-op Pain Management:    Induction:   PONV Risk Score and Plan: 1 and Ondansetron  Airway Management Planned: Natural Airway and Simple Face Mask  Additional Equipment: None  Intra-op Plan:   Post-operative Plan:   Informed Consent: I have reviewed the patients History and Physical, chart, labs and discussed the procedure including the risks, benefits and alternatives for  the proposed anesthesia with the patient or authorized representative who has indicated his/her understanding and acceptance.     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  Preop clearance per telephone encounter 01/21/2021, "Chart reviewed as part of pre-operative protocol coverage. Patient was  contacted8/18/2022in reference to pre-operative risk assessment for pending surgery as outlined below. Zaydyn Havey last seen on11/16/21by Dr. Curt Bears. Since that day, Adolpho Meenach done well.He can complete 4.0 METS without angina (stairs several times daily).  Per our clinical pharmacist: Per office protocol, patient can holdEliquisfor 3days prior to procedure. Therefore, based on ACC/AHA guidelines, the patient would be at acceptable risk for the planned procedure without further cardiovascular testing."  Patient seen by PCP [name], NP on 01/25/2021 for UTI.  Started on fosfomycin.  Preop labs reviewed, mildly elevated creatinine 1.35, was unremarkable.  EKG 04/21/2020: A sensed, V paced rhythm.  Perioperative prescription for implanted cardiac device programming per progress note 01/22/2021: Device Information:  Clinic EP Physician:Will Curt Bears, MD  Device Type:Pacemaker Manufacturer and Phone #:[employer]: [phone] Pacemaker Dependent?:Yes. Date of Last Device Check:8/15/22Normal Device Function?:Yes.  Electrophysiologist's Recommendations:  . Have magnet available. . Provide continuous ECG monitoring when magnet is used or reprogramming is to be performed. . Procedure will likely interfere with device function. Device should be programmed: Asynchronous pacing during procedure and returned to normal programming after procedure  TTE 11/13/2019: 1. There is hypokinesis of the mid to distal anteroseptal wall (may be  pacer related) with septal bounce. . Left ventricular ejection fraction,  by estimation, is 55 to 60%. The left ventricle has normal function. The  left ventricle demonstrates regional  wall motion abnormalities (see scoring diagram/findings for description).  Left ventricular diastolic parameters are consistent with Grade I  diastolic dysfunction (impaired relaxation).  2. Right ventricular systolic  function is normal. The right ventricular  size is normal. There is mildly elevated pulmonary artery systolic  pressure. The estimated right ventricular systolic pressure is 27.2 mmHg.  3. The mitral valve is normal in structure. Mild mitral valve  regurgitation. No evidence of mitral stenosis.  4. The aortic valve is normal in structure. Aortic valve regurgitation is  not visualized. Mild aortic valve sclerosis is present, with no evidence  of aortic valve stenosis.  5. Aortic dilatation noted. There is mild dilatation of the ascending  aorta measuring 39 mm.  6. The inferior vena cava is normal in size with greater than 50%  respiratory variability, suggesting right atrial pressure of 3 mmHg.   )      Anesthesia Quick Evaluation  Anesthesia Physical Anesthesia Plan  ASA: 3  Anesthesia Plan: General   Post-op Pain Management:    Induction: Intravenous  PONV Risk Score and Plan: Treatment may vary due to age or medical condition  Airway Management Planned: Oral ETT and Video Laryngoscope Planned  Additional Equipment: None  Intra-op Plan:   Post-operative Plan: Extubation in OR  Informed Consent: I have reviewed the patients History and Physical, chart, labs and discussed the procedure including the risks, benefits and alternatives for the proposed anesthesia with the patient or authorized representative who has indicated his/her understanding and acceptance.     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 Preop clearance per telephone encounter 01/21/2021, "Chart reviewed as part of pre-operative protocol coverage. Patient was contacted8/18/2022in reference to pre-operative risk assessment for pending surgery as outlined below. Torin Modica last seen on11/16/21by Dr. Curt Bears.  Since that day, Makya Yurko done well.He can complete 4.0 METS without angina (stairs several times daily). Per our clinical pharmacist: Per office protocol, patient can holdEliquisfor 3days prior to procedure.Therefore, based on ACC/AHA guidelines, the patient would be at acceptable risk for the planned procedure without further cardiovascular testing."  Pt reported LD Eliquis 02/27/21.    Recently underwent left trigeminal rhizotomy 2/75/17 without complication.   Preop labs reviewed, unremarkable.  EKG 04/21/2020: A sensed, V paced rhythm.  Perioperative prescription for implanted cardiac device programming per progress note 01/22/2021: Device Information:  Clinic EP Physician:Will Curt Bears, MD  Device Type:Pacemaker Manufacturer and Phone #:[employer]: [phone] Pacemaker Dependent?:Yes. Date of Last Device Check:01/18/21 (Remote)Normal Device Function?:No.  Electrophysiologist's Recommendations:  . Have magnet available. . Provide continuous ECG monitoring when magnet is used or reprogramming is to be performed. . Procedure may interfere with device function. Magnet should be placed over device during procedure.  TTE 11/13/2019: 1. There is hypokinesis of the mid to distal anteroseptal wall (may be  pacer related) with septal bounce. . Left ventricular ejection fraction,  by estimation, is 55 to 60%. The left ventricle has normal function. The  left ventricle demonstrates regional  wall motion abnormalities (see scoring diagram/findings for description).  Left ventricular diastolic parameters are consistent with Grade I  diastolic dysfunction (impaired relaxation).  2. Right ventricular systolic function is normal. The right ventricular  size is normal. There is mildly elevated pulmonary artery systolic  pressure. The estimated right ventricular systolic pressure is 59.1 mmHg.  3. The mitral valve is normal in  structure. Mild mitral valve  regurgitation. No evidence of mitral stenosis.  4. The aortic valve is normal in structure. Aortic valve regurgitation is  not visualized. Mild aortic valve sclerosis is present, with no evidence  of aortic valve stenosis.  5. Aortic dilatation noted. There is mild dilatation of the ascending  aorta measuring 39 mm.  6. The inferior vena cava is normal in size with greater than 50%  respiratory variability, suggesting right atrial pressure of 3 mmHg.   )   Anesthesia Quick Evaluation

## 2021-03-03 ENCOUNTER — Encounter (HOSPITAL_COMMUNITY): Payer: Self-pay | Admitting: Neurological Surgery

## 2021-03-03 ENCOUNTER — Ambulatory Visit (HOSPITAL_COMMUNITY): Payer: Medicare Other | Admitting: Anesthesiology

## 2021-03-03 ENCOUNTER — Ambulatory Visit (HOSPITAL_COMMUNITY)
Admission: RE | Admit: 2021-03-03 | Discharge: 2021-03-03 | Disposition: A | Discharge status: Home / Self Care | Payer: Medicare Other | Attending: Neurological Surgery | Admitting: Neurological Surgery

## 2021-03-03 ENCOUNTER — Encounter (HOSPITAL_COMMUNITY)
Admission: RE | Disposition: A | Discharge status: Home / Self Care | Payer: Self-pay | Source: Home / Self Care | Attending: Neurological Surgery

## 2021-03-03 ENCOUNTER — Other Ambulatory Visit: Payer: Self-pay

## 2021-03-03 ENCOUNTER — Ambulatory Visit (HOSPITAL_COMMUNITY): Payer: Medicare Other

## 2021-03-03 DIAGNOSIS — Z810 Family history of intellectual disabilities: Secondary | ICD-10-CM | POA: Diagnosis not present

## 2021-03-03 DIAGNOSIS — G500 Trigeminal neuralgia: Secondary | ICD-10-CM | POA: Insufficient documentation

## 2021-03-03 DIAGNOSIS — Z825 Family history of asthma and other chronic lower respiratory diseases: Secondary | ICD-10-CM | POA: Insufficient documentation

## 2021-03-03 DIAGNOSIS — Z8249 Family history of ischemic heart disease and other diseases of the circulatory system: Secondary | ICD-10-CM | POA: Diagnosis not present

## 2021-03-03 DIAGNOSIS — Z419 Encounter for procedure for purposes other than remedying health state, unspecified: Secondary | ICD-10-CM

## 2021-03-03 DIAGNOSIS — Z9889 Other specified postprocedural states: Secondary | ICD-10-CM | POA: Diagnosis not present

## 2021-03-03 DIAGNOSIS — Z950 Presence of cardiac pacemaker: Secondary | ICD-10-CM | POA: Diagnosis not present

## 2021-03-03 DIAGNOSIS — M1990 Unspecified osteoarthritis, unspecified site: Secondary | ICD-10-CM | POA: Diagnosis not present

## 2021-03-03 DIAGNOSIS — I480 Paroxysmal atrial fibrillation: Secondary | ICD-10-CM | POA: Diagnosis not present

## 2021-03-03 DIAGNOSIS — Z87891 Personal history of nicotine dependence: Secondary | ICD-10-CM | POA: Diagnosis not present

## 2021-03-03 DIAGNOSIS — I10 Essential (primary) hypertension: Secondary | ICD-10-CM | POA: Diagnosis not present

## 2021-03-03 DIAGNOSIS — I441 Atrioventricular block, second degree: Secondary | ICD-10-CM

## 2021-03-03 LAB — SURGICAL PCR SCREEN
MRSA, PCR: NEGATIVE
Staphylococcus aureus: POSITIVE — AB

## 2021-03-03 SURGERY — TRIGEMINAL BALLOON RHIZOTOMY
Anesthesia: General | Site: Head | Laterality: Left

## 2021-03-03 MED ORDER — PHENYLEPHRINE HCL-NACL 20-0.9 MG/250ML-% IV SOLN
INTRAVENOUS | Status: DC | PRN
  Administered 2021-03-03: 50 ug/min via INTRAVENOUS

## 2021-03-03 MED ORDER — LIDOCAINE 2% (20 MG/ML) 5 ML SYRINGE
INTRAMUSCULAR | Status: DC | PRN
  Administered 2021-03-03: 60 mg via INTRAVENOUS

## 2021-03-03 MED ORDER — PROPOFOL 10 MG/ML IV BOLUS
INTRAVENOUS | Status: DC | PRN
  Administered 2021-03-03: 20 mg via INTRAVENOUS
  Administered 2021-03-03: 70 mg via INTRAVENOUS
  Administered 2021-03-03: 50 mg via INTRAVENOUS
  Administered 2021-03-03 (×2): 30 mg via INTRAVENOUS

## 2021-03-03 MED ORDER — DEXAMETHASONE SODIUM PHOSPHATE 10 MG/ML IJ SOLN
INTRAMUSCULAR | Status: DC | PRN
  Administered 2021-03-03: 4 mg via INTRAVENOUS

## 2021-03-03 MED ORDER — LIDOCAINE HCL (PF) 1 % IJ SOLN
INTRAMUSCULAR | Status: DC | PRN
  Administered 2021-03-03: 2 mL

## 2021-03-03 MED ORDER — CHLORHEXIDINE GLUCONATE 0.12 % MT SOLN
OROMUCOSAL | Status: AC
  Administered 2021-03-03: 15 mL via OROMUCOSAL
  Filled 2021-03-03: qty 15

## 2021-03-03 MED ORDER — LACTATED RINGERS IV SOLN: INTRAVENOUS | Status: DC

## 2021-03-03 MED ORDER — CEFAZOLIN SODIUM-DEXTROSE 2-4 GM/100ML-% IV SOLN
INTRAVENOUS | Status: AC
  Filled 2021-03-03: qty 100

## 2021-03-03 MED ORDER — FENTANYL CITRATE (PF) 250 MCG/5ML IJ SOLN
INTRAMUSCULAR | Status: AC
  Filled 2021-03-03: qty 5

## 2021-03-03 MED ORDER — MIDAZOLAM HCL 2 MG/2ML IJ SOLN
INTRAMUSCULAR | Status: AC
  Filled 2021-03-03: qty 2

## 2021-03-03 MED ORDER — IOHEXOL 300 MG/ML  SOLN
INTRAMUSCULAR | Status: DC | PRN
  Administered 2021-03-03: 1 mL

## 2021-03-03 MED ORDER — LIDOCAINE-EPINEPHRINE 1 %-1:100000 IJ SOLN
INTRAMUSCULAR | Status: AC
  Filled 2021-03-03: qty 1

## 2021-03-03 MED ORDER — CHLORHEXIDINE GLUCONATE CLOTH 2 % EX PADS: 6.0000 | MEDICATED_PAD | Freq: Once | CUTANEOUS | Status: DC

## 2021-03-03 MED ORDER — ORAL CARE MOUTH RINSE: 15.0000 mL | Freq: Once | OROMUCOSAL | Status: AC

## 2021-03-03 MED ORDER — CHLORHEXIDINE GLUCONATE 0.12 % MT SOLN: 15.0000 mL | Freq: Once | OROMUCOSAL | Status: AC

## 2021-03-03 MED ORDER — SUCCINYLCHOLINE CHLORIDE 200 MG/10ML IV SOSY
PREFILLED_SYRINGE | INTRAVENOUS | Status: DC | PRN
  Administered 2021-03-03: 60 mg via INTRAVENOUS

## 2021-03-03 MED ORDER — APIXABAN 5 MG PO TABS: 5.0000 mg | ORAL_TABLET | Freq: Two times a day (BID) | ORAL | 1 refills | Status: AC

## 2021-03-03 MED ORDER — ONDANSETRON HCL 4 MG/2ML IJ SOLN
INTRAMUSCULAR | Status: DC | PRN
  Administered 2021-03-03: 4 mg via INTRAVENOUS

## 2021-03-03 MED ORDER — FENTANYL CITRATE (PF) 250 MCG/5ML IJ SOLN
INTRAMUSCULAR | Status: DC | PRN
  Administered 2021-03-03: 25 ug via INTRAVENOUS
  Administered 2021-03-03: 50 ug via INTRAVENOUS

## 2021-03-03 MED ORDER — CEFAZOLIN SODIUM-DEXTROSE 2-4 GM/100ML-% IV SOLN
2.0000 g | INTRAVENOUS | Status: AC
  Administered 2021-03-03: 2 g via INTRAVENOUS

## 2021-03-03 MED ORDER — DEXAMETHASONE SODIUM PHOSPHATE 10 MG/ML IJ SOLN
INTRAMUSCULAR | Status: AC
  Filled 2021-03-03: qty 1

## 2021-03-03 MED ORDER — FENTANYL CITRATE (PF) 100 MCG/2ML IJ SOLN: 25.0000 ug | INTRAMUSCULAR | Status: DC | PRN

## 2021-03-03 MED ORDER — ONDANSETRON HCL 4 MG/2ML IJ SOLN
INTRAMUSCULAR | Status: AC
  Filled 2021-03-03: qty 2

## 2021-03-03 SURGICAL SUPPLY — 25 items
BAG COUNTER SPONGE SURGICOUNT (BAG) ×2 IMPLANT
BNDG ADH 1X3 SHEER STRL LF (GAUZE/BANDAGES/DRESSINGS) ×2 IMPLANT
CATH EMB 4FR 40CM (CATHETERS) ×2 IMPLANT
COVER BACK TABLE 60X90IN (DRAPES) ×2 IMPLANT
DRAPE HALF SHEET 40X57 (DRAPES) ×2 IMPLANT
DURAPREP 6ML APPLICATOR 50/CS (WOUND CARE) ×2 IMPLANT
GAUZE 4X4 16PLY ~~LOC~~+RFID DBL (SPONGE) ×2 IMPLANT
GLOVE SURG ENC MOIS LTX SZ7.5 (GLOVE) ×4 IMPLANT
GLOVE SURG LTX SZ8 (GLOVE) ×4 IMPLANT
GOWN STRL REUS W/ TWL LRG LVL3 (GOWN DISPOSABLE) ×2 IMPLANT
GOWN STRL REUS W/ TWL XL LVL3 (GOWN DISPOSABLE) IMPLANT
GOWN STRL REUS W/TWL 2XL LVL3 (GOWN DISPOSABLE) ×2 IMPLANT
GOWN STRL REUS W/TWL LRG LVL3 (GOWN DISPOSABLE) ×2
GOWN STRL REUS W/TWL XL LVL3 (GOWN DISPOSABLE)
KIT BASIN OR (CUSTOM PROCEDURE TRAY) ×2 IMPLANT
KIT TURNOVER KIT B (KITS) ×2 IMPLANT
MARKER SKIN DUAL TIP RULER LAB (MISCELLANEOUS) ×2 IMPLANT
NEEDLE EPID 14G 6 TOUHY (NEEDLE) ×2 IMPLANT
NEEDLE HYPO 25X1 1.5 SAFETY (NEEDLE) ×2 IMPLANT
PIN SAFETY STERILE (MISCELLANEOUS) ×2 IMPLANT
STOPCOCK 4 WAY LG BORE MALE ST (IV SETS) ×2 IMPLANT
SYR CONTROL 10ML LL (SYRINGE) ×2 IMPLANT
SYR TB 1ML LUER SLIP (SYRINGE) ×2 IMPLANT
TUBING EXTENTION W/L.L. (IV SETS) ×2 IMPLANT
WATER STERILE IRR 1000ML POUR (IV SOLUTION) ×2 IMPLANT

## 2021-03-03 NOTE — Transfer of Care (Signed)
Immediate Anesthesia Transfer of Care Note  Patient: [name]  Procedure(s) Performed: Left Trigeminal Balloon Rhizotomy (Left: Head)  Patient Location: PACU  Anesthesia Type:General  Level of Consciousness: awake and alert   Airway & Oxygen Therapy: Patient Spontanous Breathing and Patient connected to face mask oxygen  Post-op Assessment: Report given to RN and Post -op Vital signs reviewed and stable  Post vital signs: Reviewed and stable  Last Vitals:  Vitals Value Taken Time  BP 133/77 03/03/21 1735  Temp 36.2 C 03/03/21 1735  Pulse 64 03/03/21 1739  Resp 18 03/03/21 1739  SpO2 100 % 03/03/21 1739  Vitals shown include unvalidated device data.  Last Pain:  Vitals:   03/03/21 1735  TempSrc:   PainSc: 0-No pain      Patients Stated Pain Goal: 2 (89/16/94 5038)  Complications: No notable events documented.

## 2021-03-03 NOTE — H&P (Signed)
Surgical H&P Update  HPI: 87 y.o. man with left trigeminal neuralgia. Recently had an attempted RFA but poor cooperation lead to suboptimal lesion and his pain quickly returned. Still having severe left sided facial pain in all 3 branches and wishes to proceed with surgery.  PMHx:  Past Medical History:  Diagnosis Date   Arrhythmia    Arthritis    Atrial fibrillation ([place])    Cancer ([place]) 1972   Dysrhythmia    ED (erectile dysfunction)    Glaucoma    History of chicken pox    Hypertension    Melanoma ([place])    left abdomen   Presence of permanent cardiac pacemaker    FamHx:  Family History  Problem Relation Age of Onset   Hypertension Mother    Heart failure Mother    Heart disease Mother    Hypertension Father    Asthma Sister    Hypertension Sister    Hypertension Sister    Learning disabilities Sister    SocHx:  reports that he quit smoking about 54 years ago. His smoking use included cigarettes. He has a 10.00 pack-year smoking history. He quit smokeless tobacco use about 54 years ago. He reports that he does not currently use alcohol. He reports that he does not use drugs.  Physical Exam: AOx3, PERRL, FS, TM  Strength 5/5 x4, SILTx4  Assesment/Plan: 87 y.o. man with refractory left trigeminal neuralgia, here for L trigeminal balloon rhizotomy. Risks, benefits, and alternatives discussed and the patient would like to continue with surgery.  -OR today -home from PACU post-op if he's feeling up to it  Judith Part, MD 03/03/21 2:41 PM

## 2021-03-03 NOTE — Op Note (Signed)
PATIENT: [name]  DAY OF SURGERY: 03/03/21   PRE-OPERATIVE DIAGNOSIS:  Trigeminal neuralgia, left sided   POST-OPERATIVE DIAGNOSIS:  Same   PROCEDURE:  Left trigeminal balloon rhizotomy   SURGEON:  Surgeon(s) and Role:    Judith Part, MD - Primary    Dawley, Pieter Partridge, DO - Assisting   ANESTHESIA: ETGA   BRIEF HISTORY: This is an 87 year old man who presented with left sided refractory TGN. We initially tried an RFA but he was unable to cooperate very well during testing and only had about 24 hours of symptom improvement. His symptoms persisted and were severe, so I recommended a balloon rhizotomy under general anesthesia. This was discussed with the patient as well as risks, benefits, and alternatives and wished to proceed with surgery.   OPERATIVE DETAIL: The patient was taken to the operating room and placed on the OR table in the supine position. A formal time out was performed with two patient identifiers and confirmed the operative site. Anesthesia was induced by the anesthesia team.   The operative site was marked on the left side of the face, 2.5cm lateral to the corner of the lips, the area was then prepped and draped in a sterile fashion. With a gloved hand guiding the needle in the oropharynx, sterile technique was used to guide a 14Fr Tuohy needle towards the medial canthus and a point anterior to the tragus while using fluoroscopic shots as needed. The needle was advanced into the foramen ovale. A 4Fr Fogarty balloon was inserted through the needle with fluoroscopic guidance then inflated with contrast. The balloon took the shape of the foramen with a portion of it traveling along V2. The balloon was therefore fully inflated and held in place for 2 minutes. The balloon was deflated and removed, the stylet reintroduced, and the needle was removed and the patient was returned to anesthesia.    EBL:  85mL   DRAINS: none   SPECIMENS: none   Judith Part,  MD 03/03/21 4:04 PM

## 2021-03-03 NOTE — Anesthesia Procedure Notes (Addendum)
Procedure Name: Intubation Date/Time: 03/03/2021 4:09 PM Performed by: Inda Coke, CRNA Pre-anesthesia Checklist: Patient identified, Emergency Drugs available, Suction available and Patient being monitored Patient Re-evaluated:Patient Re-evaluated prior to induction Oxygen Delivery Method: Circle System Utilized Preoxygenation: Pre-oxygenation with 100% oxygen Induction Type: IV induction Ventilation: Mask ventilation without difficulty and Oral airway inserted - appropriate to patient size Laryngoscope Size: Glidescope and 4 Grade View: Grade I Tube type: Oral Tube size: 7.5 mm Number of attempts: 1 Airway Equipment and Method: Stylet and Oral airway Placement Confirmation: ETT inserted through vocal cords under direct vision, positive ETCO2 and breath sounds checked- equal and bilateral Secured at: 22 cm Tube secured with: Tape Dental Injury: Teeth and Oropharynx as per pre-operative assessment  Difficulty Due To: Difficult Airway- due to limited oral opening, Difficulty was anticipated and Difficult Airway- due to reduced neck mobility Future Recommendations: Recommend- induction with short-acting agent, and alternative techniques readily available

## 2021-03-03 NOTE — Progress Notes (Signed)
[name] with [employer] [address] Jude pacer rep called and stated she was running 30-46mins late. [name] stated "Ok to use magnet if patient needs to go to surgery before I can get there." Dr. Lissa Hoard made aware.

## 2021-03-03 NOTE — Progress Notes (Signed)
Could not get in touch with [employer] device rep, Mickel Baas. Interrogated pacemaker in PACU myself and was called by Iona Beard with [employer] that data was transmitted and everything looks good.   Gabriel Rainwater, RN

## 2021-03-04 NOTE — Anesthesia Postprocedure Evaluation (Signed)
Anesthesia Post Note  Patient: Casimer Russett  Procedure(s) Performed: Left Trigeminal Balloon Rhizotomy (Left: Head)     Patient location during evaluation: PACU Anesthesia Type: General Level of consciousness: awake and alert Pain management: pain level controlled Vital Signs Assessment: post-procedure vital signs reviewed and stable Respiratory status: spontaneous breathing, nonlabored ventilation, respiratory function stable and patient connected to nasal cannula oxygen Cardiovascular status: blood pressure returned to baseline and stable Postop Assessment: no apparent nausea or vomiting Anesthetic complications: no   No notable events documented.  Last Vitals:  Vitals:   03/03/21 1850 03/03/21 1905  BP: 133/71 124/68  Pulse: 63 68  Resp: (!) 22 13  Temp:  (!) 36.1 C  SpO2: 98% 100%    Last Pain:  Vitals:   03/03/21 1905  TempSrc:   PainSc: 0-No pain                 Calen Posch S

## 2021-03-15 ENCOUNTER — Ambulatory Visit: Payer: Medicare Other | Admitting: Neurology

## 2021-03-16 DIAGNOSIS — H40153 Residual stage of open-angle glaucoma, bilateral: Secondary | ICD-10-CM | POA: Diagnosis not present

## 2021-03-23 ENCOUNTER — Ambulatory Visit: Payer: Medicare Other | Admitting: Family Medicine

## 2021-04-06 DIAGNOSIS — L0202 Furuncle of face: Secondary | ICD-10-CM | POA: Diagnosis not present

## 2021-04-06 DIAGNOSIS — B9689 Other specified bacterial agents as the cause of diseases classified elsewhere: Secondary | ICD-10-CM | POA: Diagnosis not present

## 2021-04-13 DIAGNOSIS — L0202 Furuncle of face: Secondary | ICD-10-CM | POA: Diagnosis not present

## 2021-04-13 DIAGNOSIS — B9689 Other specified bacterial agents as the cause of diseases classified elsewhere: Secondary | ICD-10-CM | POA: Diagnosis not present

## 2021-04-19 ENCOUNTER — Ambulatory Visit (INDEPENDENT_AMBULATORY_CARE_PROVIDER_SITE_OTHER): Payer: Medicare Other

## 2021-04-19 DIAGNOSIS — I4430 Unspecified atrioventricular block: Secondary | ICD-10-CM

## 2021-04-20 DIAGNOSIS — L0202 Furuncle of face: Secondary | ICD-10-CM | POA: Diagnosis not present

## 2021-04-20 DIAGNOSIS — B9689 Other specified bacterial agents as the cause of diseases classified elsewhere: Secondary | ICD-10-CM | POA: Diagnosis not present

## 2021-04-20 LAB — CUP PACEART REMOTE DEVICE CHECK
Battery Remaining Longevity: 43 mo
Battery Remaining Percentage: 35 %
Battery Voltage: 2.95 V
Brady Statistic AP VP Percent: 72 %
Brady Statistic AP VS Percent: 1 %
Brady Statistic AS VP Percent: 28 %
Brady Statistic AS VS Percent: 1 %
Brady Statistic RA Percent Paced: 72 %
Brady Statistic RV Percent Paced: 99 %
Date Time Interrogation Session: 20221114022458
Implantable Lead Implant Date: 20161003
Implantable Lead Implant Date: 20161003
Implantable Lead Location: 753859
Implantable Lead Location: 753860
Implantable Pulse Generator Implant Date: 20161003
Lead Channel Impedance Value: 380 Ohm
Lead Channel Impedance Value: 400 Ohm
Lead Channel Pacing Threshold Amplitude: 0.5 V
Lead Channel Pacing Threshold Amplitude: 0.625 V
Lead Channel Pacing Threshold Pulse Width: 0.4 ms
Lead Channel Pacing Threshold Pulse Width: 0.4 ms
Lead Channel Sensing Intrinsic Amplitude: 5 mV
Lead Channel Sensing Intrinsic Amplitude: 7.5 mV
Lead Channel Setting Pacing Amplitude: 0.875
Lead Channel Setting Pacing Amplitude: 1.5 V
Lead Channel Setting Pacing Pulse Width: 0.4 ms
Lead Channel Setting Sensing Sensitivity: 0.7 mV
Pulse Gen Model: 2240
Pulse Gen Serial Number: 7818292

## 2021-04-22 ENCOUNTER — Other Ambulatory Visit: Payer: Self-pay | Admitting: Family Medicine

## 2021-04-22 NOTE — Telephone Encounter (Signed)
Please try and schedule follow up with Dr. Diona Browner.  She had wanted him to follow up with her in October.

## 2021-04-23 NOTE — Telephone Encounter (Signed)
Called patient and left vm.

## 2021-04-26 NOTE — Telephone Encounter (Signed)
2nd attemp to call pt to get get scheduled for a F/u

## 2021-04-27 NOTE — Progress Notes (Signed)
Remote pacemaker transmission.   

## 2021-05-04 DIAGNOSIS — B9689 Other specified bacterial agents as the cause of diseases classified elsewhere: Secondary | ICD-10-CM | POA: Diagnosis not present

## 2021-05-04 DIAGNOSIS — L03211 Cellulitis of face: Secondary | ICD-10-CM | POA: Diagnosis not present

## 2021-05-14 DIAGNOSIS — B9689 Other specified bacterial agents as the cause of diseases classified elsewhere: Secondary | ICD-10-CM | POA: Diagnosis not present

## 2021-05-14 DIAGNOSIS — L0202 Furuncle of face: Secondary | ICD-10-CM | POA: Diagnosis not present

## 2021-05-25 ENCOUNTER — Encounter: Payer: Self-pay | Admitting: Cardiology

## 2021-05-25 DIAGNOSIS — B9689 Other specified bacterial agents as the cause of diseases classified elsewhere: Secondary | ICD-10-CM | POA: Diagnosis not present

## 2021-05-25 DIAGNOSIS — B029 Zoster without complications: Secondary | ICD-10-CM | POA: Diagnosis not present

## 2021-05-25 DIAGNOSIS — L0202 Furuncle of face: Secondary | ICD-10-CM | POA: Diagnosis not present

## 2021-06-22 ENCOUNTER — Telehealth: Payer: Self-pay | Admitting: Family Medicine

## 2021-06-22 NOTE — Telephone Encounter (Signed)
Jennifer with [facility] called to let you know that pt passed away on 20-Jun-2021.

## 2021-06-24 NOTE — Telephone Encounter (Signed)
Noted. No death certificate completion request on DAVE.

## 2021-07-07 DEATH — deceased

## 2021-10-01 IMAGING — RF DG SKULL 1-3V
1 series · 2 of 2 positions shown · non-contrast
Comparison: None.

CLINICAL DATA: Left trigeminal rhizotomy

EXAM:
SKULL - 1-3 VIEW

[Series 1: run · 2 of 2 slices shown]
[im 1/2]
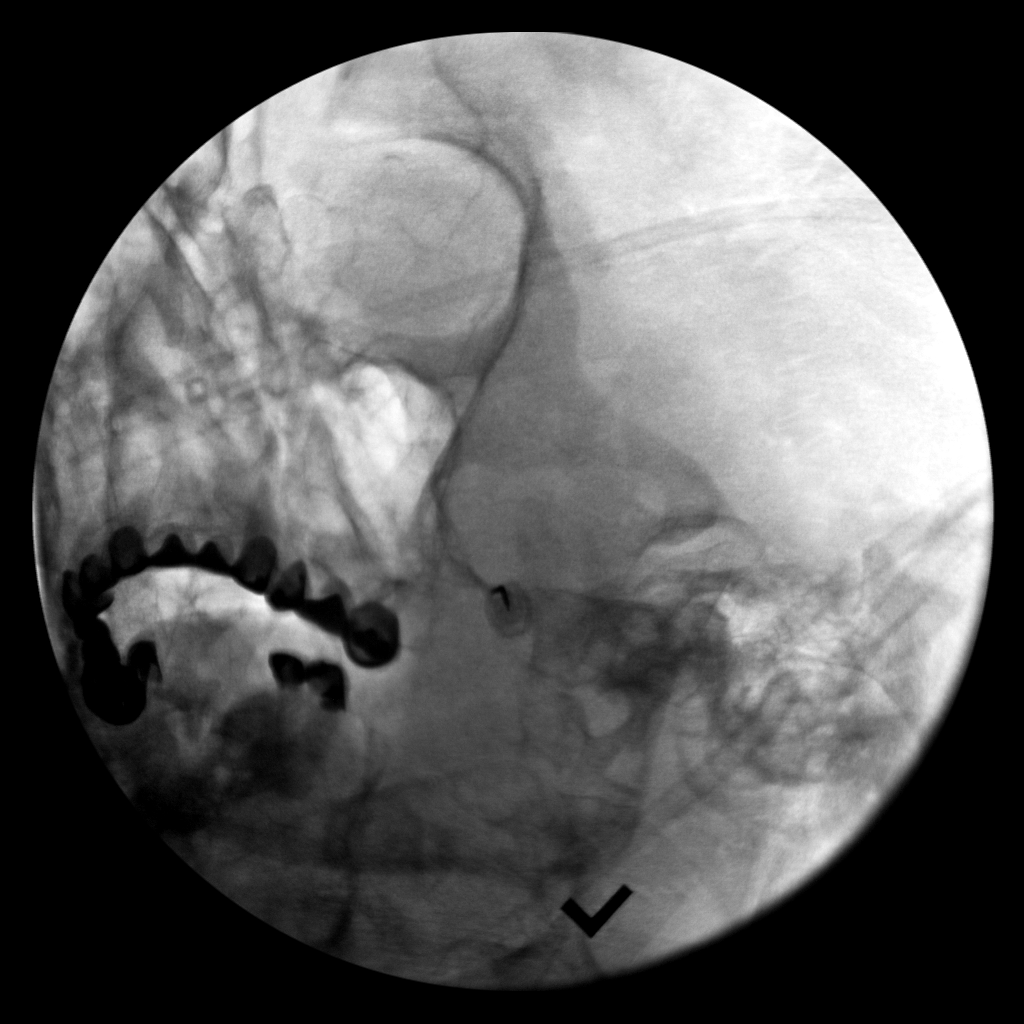
[im 2/2]
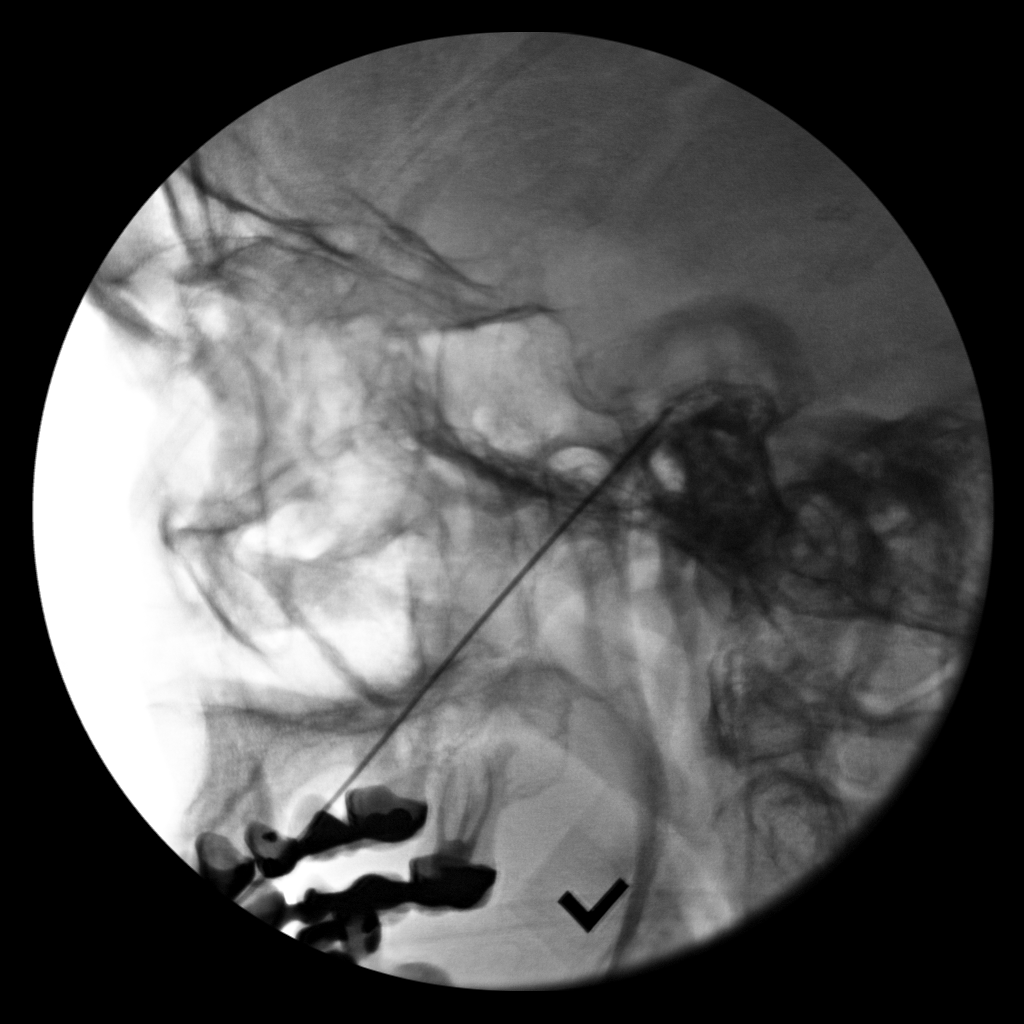

[2 of 2 positions shown; findings below may reference images not displayed]

FINDINGS: Two fluoroscopic images are obtained during the performance of the
procedure and are provided for interpretation only. Provided images
demonstrate needle localization to the left skull base. Please refer
to the operative report for full description of findings.

FLUOROSCOPY TIME:  32 seconds
IMPRESSION: 1. Intraoperative fluoroscopic assistance as above.

.

## 2021-11-05 IMAGING — RF DG SKULL 1-3V
1 series · 2 of 2 positions shown · non-contrast
Comparison: None.

CLINICAL DATA: Left trigeminal balloon rhizotomy

EXAM:
SKULL - 1-3 VIEW

[Series 1: run · 2 of 2 slices shown]
[im 1/2]
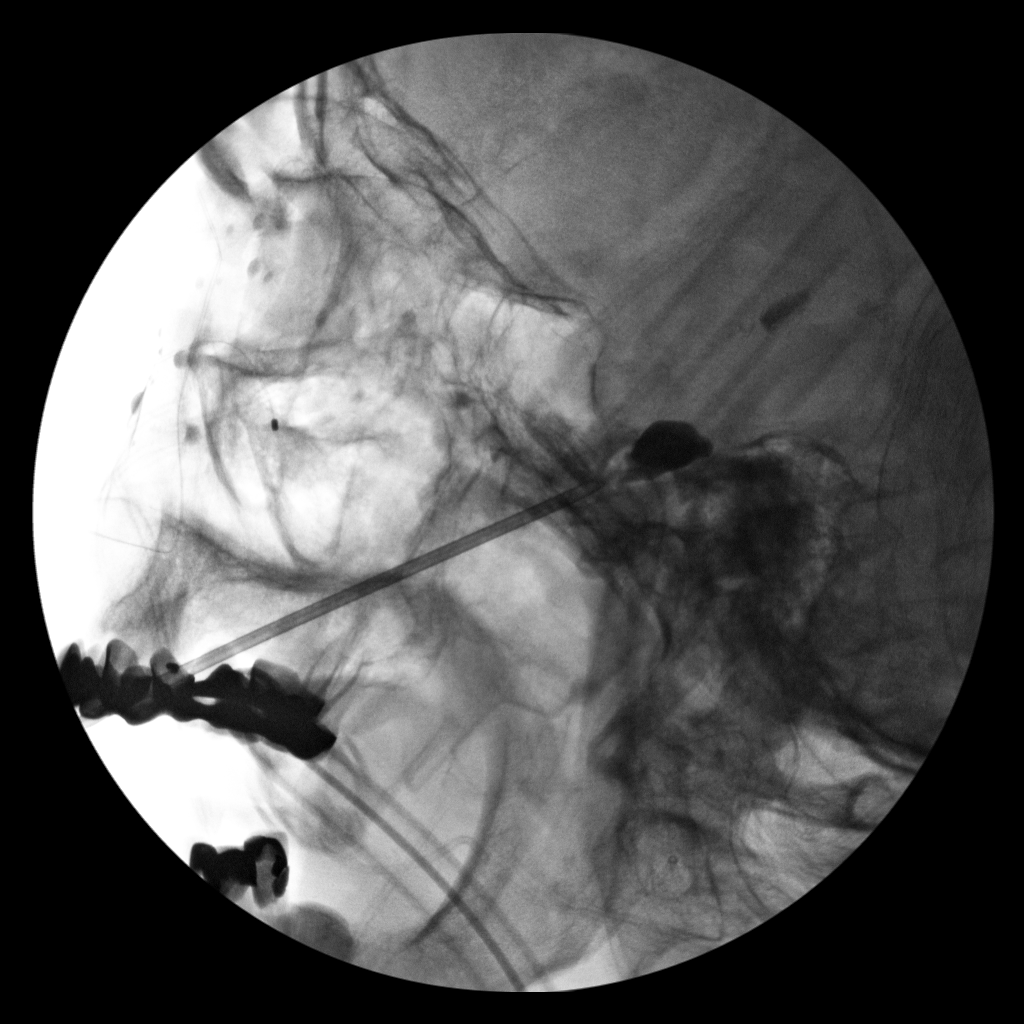
[im 2/2]
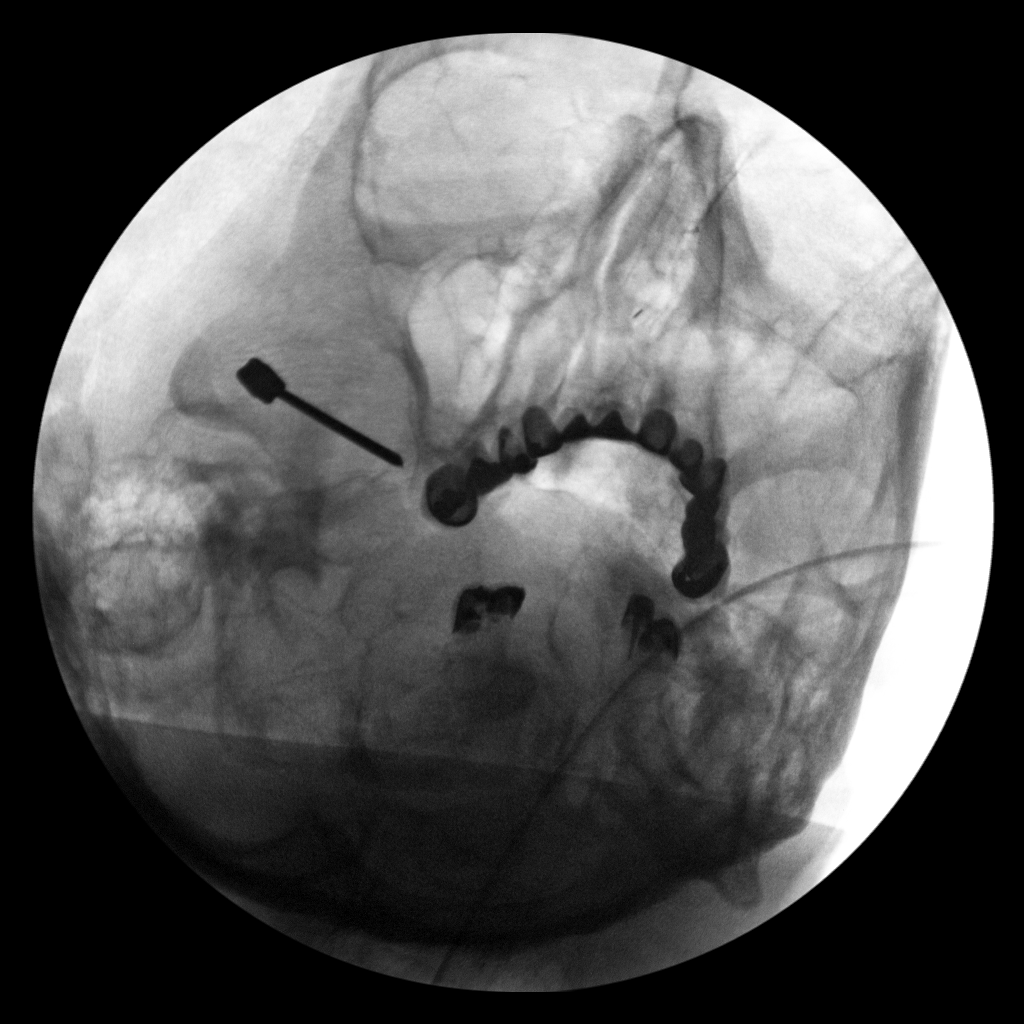

[2 of 2 positions shown; findings below may reference images not displayed]

FINDINGS: Two fluoroscopic images are provided, obtained during approach to
the right trigeminal nerve.
IMPRESSION: Intraoperative fluoroscopy
